# Patient Record
Sex: Male | Born: 1995
Health system: Southern US, Community
[De-identification: ages and names within clinical notes are randomized; demographics above are authoritative.]

## PROBLEM LIST (undated history)

## (undated) DIAGNOSIS — I10 Essential (primary) hypertension: Secondary | ICD-10-CM

## (undated) DIAGNOSIS — R1115 Cyclical vomiting syndrome unrelated to migraine: Secondary | ICD-10-CM

## (undated) DIAGNOSIS — K219 Gastro-esophageal reflux disease without esophagitis: Secondary | ICD-10-CM

## (undated) DIAGNOSIS — F988 Other specified behavioral and emotional disorders with onset usually occurring in childhood and adolescence: Secondary | ICD-10-CM

## (undated) HISTORY — DX: Cyclical vomiting syndrome unrelated to migraine: R11.15

## (undated) HISTORY — PX: OTHER SURGICAL HISTORY: SHX169

## (undated) HISTORY — DX: Essential (primary) hypertension: I10

## (undated) HISTORY — DX: Other specified behavioral and emotional disorders with onset usually occurring in childhood and adolescence: F98.8

## (undated) HISTORY — DX: Gastro-esophageal reflux disease without esophagitis: K21.9

---
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
## 2015-08-29 ENCOUNTER — Encounter: Payer: Self-pay | Admitting: Family Medicine

## 2015-08-29 ENCOUNTER — Ambulatory Visit (INDEPENDENT_AMBULATORY_CARE_PROVIDER_SITE_OTHER): Payer: Self-pay | Admitting: Family Medicine

## 2015-08-29 VITALS — BP 150/70 | Ht 75.0 in | Wt 232.2 lb

## 2015-08-29 DIAGNOSIS — I10 Essential (primary) hypertension: Secondary | ICD-10-CM

## 2015-08-29 DIAGNOSIS — Z13220 Encounter for screening for lipoid disorders: Secondary | ICD-10-CM

## 2015-08-29 DIAGNOSIS — Z79899 Other long term (current) drug therapy: Secondary | ICD-10-CM

## 2015-08-29 MED ORDER — ENALAPRIL MALEATE 10 MG PO TABS: 10.0000 mg | ORAL_TABLET | Freq: Every day | ORAL | Status: DC

## 2015-08-29 NOTE — Patient Instructions (Signed)
Hypertension Hypertension, commonly called high blood pressure, is when the force of blood pumping through your arteries is too strong. Your arteries are the blood vessels that carry blood from your heart throughout your body. A blood pressure reading consists of a higher number over a lower number, such as 110/72. The higher number (systolic) is the pressure inside your arteries when your heart pumps. The lower number (diastolic) is the pressure inside your arteries when your heart relaxes. Ideally you want your blood pressure below 120/80. Hypertension forces your heart to work harder to pump blood. Your arteries may become narrow or stiff. Having untreated or uncontrolled hypertension can cause heart attack, stroke, kidney disease, and other problems. RISK FACTORS Some risk factors for high blood pressure are controllable. Others are not.  Risk factors you cannot control include:   Race. You may be at higher risk if you are African American.  Age. Risk increases with age.  Gender. Men are at higher risk than women before age 45 years. After age 65, women are at higher risk than men. Risk factors you can control include:  Not getting enough exercise or physical activity.  Being overweight.  Getting too much fat, sugar, calories, or salt in your diet.  Drinking too much alcohol. SIGNS AND SYMPTOMS Hypertension does not usually cause signs or symptoms. Extremely high blood pressure (hypertensive crisis) may cause headache, anxiety, shortness of breath, and nosebleed. DIAGNOSIS To check if you have hypertension, your health care provider will measure your blood pressure while you are seated, with your arm held at the level of your heart. It should be measured at least twice using the same arm. Certain conditions can cause a difference in blood pressure between your right and left arms. A blood pressure reading that is higher than normal on one occasion does not mean that you need treatment. If  it is not clear whether you have high blood pressure, you may be asked to return on a different day to have your blood pressure checked again. Or, you may be asked to monitor your blood pressure at home for 1 or more weeks. TREATMENT Treating high blood pressure includes making lifestyle changes and possibly taking medicine. Living a healthy lifestyle can help lower high blood pressure. You may need to change some of your habits. Lifestyle changes may include:  Following the DASH diet. This diet is high in fruits, vegetables, and whole grains. It is low in salt, red meat, and added sugars.  Keep your sodium intake below 2,300 mg per day.  Getting at least 30-45 minutes of aerobic exercise at least 4 times per week.  Losing weight if necessary.  Not smoking.  Limiting alcoholic beverages.  Learning ways to reduce stress. Your health care provider may prescribe medicine if lifestyle changes are not enough to get your blood pressure under control, and if one of the following is true:  You are 18-59 years of age and your systolic blood pressure is above 140.  You are 60 years of age or older, and your systolic blood pressure is above 150.  Your diastolic blood pressure is above 90.  You have diabetes, and your systolic blood pressure is over 140 or your diastolic blood pressure is over 90.  You have kidney disease and your blood pressure is above 140/90.  You have heart disease and your blood pressure is above 140/90. Your personal target blood pressure may vary depending on your medical conditions, your age, and other factors. HOME CARE INSTRUCTIONS    Have your blood pressure rechecked as directed by your health care provider.   Take medicines only as directed by your health care provider. Follow the directions carefully. Blood pressure medicines must be taken as prescribed. The medicine does not work as well when you skip doses. Skipping doses also puts you at risk for  problems.  Do not smoke.   Monitor your blood pressure at home as directed by your health care provider. SEEK MEDICAL CARE IF:   You think you are having a reaction to medicines taken.  You have recurrent headaches or feel dizzy.  You have swelling in your ankles.  You have trouble with your vision. SEEK IMMEDIATE MEDICAL CARE IF:  You develop a severe headache or confusion.  You have unusual weakness, numbness, or feel faint.  You have severe chest or abdominal pain.  You vomit repeatedly.  You have trouble breathing. MAKE SURE YOU:   Understand these instructions.  Will watch your condition.  Will get help right away if you are not doing well or get worse.   This information is not intended to replace advice given to you by your health care provider. Make sure you discuss any questions you have with your health care provider.   Document Released: 10/14/2005 Document Revised: 02/28/2015 Document Reviewed: 08/06/2013 Elsevier Interactive Patient Education 2016 Elsevier Inc.  

## 2015-08-29 NOTE — Progress Notes (Signed)
   Subjective:    Patient ID: Tyler [name], male    DOB: 1996-09-14, 19 y.o.   MRN: [id number]  Hypertension This is a chronic problem. The current episode started more than 1 year ago. The problem is unchanged. The problem is uncontrolled. There are no associated agents to hypertension. There are no known risk factors for coronary artery disease. Past treatments include nothing. The current treatment provides no improvement. There are no compliance problems.    Patient states that he has no other concerns at this time.   167 over 91  Walking five mi per day at work  A lot of fast food admits to poor diet   There is strong family history of high blood pressure.  Review of the last several years reveals patient's blood pressure often substantial elevated when he arrives  Has a history of abdominal migraines is a 19 year old even at that time was told blood pressure was on the high side but that the beta blocker that he took seemed to help with this  Very strong family history of hypertension  Review of Systems No headache no chest pain no back pain no abdominal pain some weight gain no change in bowel habits complete ROS otherwise negative    Objective:   Physical Exam Alert vitals stable blood pressure 145/86 both arms on repeat HEENT normal. Lungs clear heart regular in rhythm       Assessment & Plan:  Impression hypertension time to treatment discuss is been elevated for years without improvement. Many questions answered regarding intervention medication side effects long-term risk benefits etc. plan initiate enalapril 10 mg daily at bedtime. Appropriate blood work. Diet exercise discussed. Recheck in 2 months. 25 minutes spent most in discussion [place]

## 2015-10-31 ENCOUNTER — Ambulatory Visit: Payer: Self-pay | Admitting: Family Medicine

## 2015-11-14 ENCOUNTER — Ambulatory Visit (INDEPENDENT_AMBULATORY_CARE_PROVIDER_SITE_OTHER): Payer: BLUE CROSS/BLUE SHIELD | Admitting: Family Medicine

## 2015-11-14 ENCOUNTER — Encounter: Payer: Self-pay | Admitting: Family Medicine

## 2015-11-14 VITALS — BP 132/74 | Ht 75.0 in | Wt 240.0 lb

## 2015-11-14 DIAGNOSIS — I10 Essential (primary) hypertension: Secondary | ICD-10-CM

## 2015-11-14 NOTE — Progress Notes (Signed)
   Subjective:    Patient ID: Tyler [name], male    DOB: [birth year], 20 y.o.   MRN: [id number]  Hypertension This is a chronic problem. Treatments tried: enalapril. Compliance problems include diet.    Pt states no concerns or problems. Did not do bloodwork last time bc he did not have insurance.   Once or twice missed dose  Watching salt intak ehs cut out  Salt down   Has notgot his blood work yet.  Watching salt intake. No obvious side effects from the medication.  Review of Systems No headache no chest pain no back pain no abdominal pain ROS otherwise negative    Objective:   Physical Exam  Alert no acute distress vital stable blood pressure repeat 128/82 HEENT normal lungs clear      Assessment & Plan:  Impression hypertension control much improved discussed plan press on blood work. Diet exercise discussed compliance discussed recheck in 6 months WSL

## 2015-12-26 ENCOUNTER — Encounter: Payer: Self-pay | Admitting: Family Medicine

## 2015-12-26 ENCOUNTER — Ambulatory Visit (INDEPENDENT_AMBULATORY_CARE_PROVIDER_SITE_OTHER): Payer: BLUE CROSS/BLUE SHIELD | Admitting: Family Medicine

## 2015-12-26 VITALS — BP 132/78 | Temp 99.8°F | Ht 75.0 in | Wt 236.4 lb

## 2015-12-26 DIAGNOSIS — J111 Influenza due to unidentified influenza virus with other respiratory manifestations: Secondary | ICD-10-CM | POA: Diagnosis not present

## 2015-12-26 MED ORDER — OSELTAMIVIR PHOSPHATE 75 MG PO CAPS: 75.0000 mg | ORAL_CAPSULE | Freq: Two times a day (BID) | ORAL | Status: DC

## 2015-12-26 NOTE — Progress Notes (Signed)
   Subjective:    Patient ID: Tyler [name], male    DOB: Mar 25, 1996, 20 y.o.   MRN: [id number]  Sore Throat  This is a new problem. The current episode started in the past 7 days. Associated symptoms include coughing, diarrhea and headaches. Treatments tried: Delsym, throat spray.   Feeling terrible yest4day  Head ache dizziness  Bad cough   Throat hurting  Energy level puny  ,  Deep cough, no fever hi, low gr Patient states no other concerns this visit.  Review of Systems  Respiratory: Positive for cough.   Gastrointestinal: Positive for diarrhea.  Neurological: Positive for headaches.       Objective:   Physical Exam   alert moderate malaise. Positive fever. Vitals stable. HEENT moderate nasal congestion. Neck supple lungs deep bronchial cough no wheezes no crackles heart rare rhythm      Assessment & Plan:   impression influenza discussed #2 hypertension improved on repeat plan Tamiflu twice a day 5 days. Symptom care discussed warning signs discussed WSL

## 2016-01-24 ENCOUNTER — Encounter: Payer: Self-pay | Admitting: Family Medicine

## 2016-06-06 DIAGNOSIS — H5213 Myopia, bilateral: Secondary | ICD-10-CM | POA: Diagnosis not present

## 2016-06-06 DIAGNOSIS — H52221 Regular astigmatism, right eye: Secondary | ICD-10-CM | POA: Diagnosis not present

## 2016-06-21 ENCOUNTER — Other Ambulatory Visit: Payer: Self-pay | Admitting: Family Medicine

## 2016-09-06 ENCOUNTER — Encounter: Payer: Self-pay | Admitting: Family Medicine

## 2016-09-06 ENCOUNTER — Ambulatory Visit (INDEPENDENT_AMBULATORY_CARE_PROVIDER_SITE_OTHER): Payer: BLUE CROSS/BLUE SHIELD | Admitting: Family Medicine

## 2016-09-06 VITALS — BP 134/82 | Ht 75.0 in | Wt 247.4 lb

## 2016-09-06 DIAGNOSIS — I10 Essential (primary) hypertension: Secondary | ICD-10-CM | POA: Diagnosis not present

## 2016-09-06 DIAGNOSIS — Z13220 Encounter for screening for lipoid disorders: Secondary | ICD-10-CM

## 2016-09-06 DIAGNOSIS — Z79899 Other long term (current) drug therapy: Secondary | ICD-10-CM | POA: Diagnosis not present

## 2016-09-06 MED ORDER — ENALAPRIL MALEATE 10 MG PO TABS: 10.0000 mg | ORAL_TABLET | Freq: Every day | ORAL | 5 refills | Status: DC

## 2016-09-06 NOTE — Progress Notes (Signed)
   Subjective:    Patient ID: Tyler [name], male    DOB: 01/28/1996, 20 y.o.   MRN: [id number]  Hypertension  This is a chronic problem. The current episode started more than 1 year ago.   Patient states no other concerns this visit.   Takes med well without a problem Blood pressure medicine and blood pressure levels reviewed today with patient. Compliant with blood pressure medicine. States does not miss a dose. No obvious side effects. Blood pressure generally good when checked elsewhere. Watching salt intake.    Review of Systems No headache, no major weight loss or weight gain, no chest pain no back pain abdominal pain no change in bowel habits complete ROS otherwise negative     Objective:   Physical Exam  Alert vitals stable, NAD. Blood pressure good on repeat. HEENT normal. Lungs clear. Heart regular rate and rhythm.       Assessment & Plan:  Impression hypertension great control discussed, to continue same medicine. Just ran out the last several days some numbers borderline high today plan continue same medications. Diet exercise discussed, appropriate blood work

## 2016-09-23 ENCOUNTER — Ambulatory Visit: Payer: BLUE CROSS/BLUE SHIELD | Admitting: Family Medicine

## 2017-07-25 ENCOUNTER — Telehealth: Payer: Self-pay | Admitting: Family Medicine

## 2017-07-25 NOTE — Telephone Encounter (Signed)
Patient has never completed blood work ordered in [facility] for 2017 or 2016 (lipid, liver and met 7)

## 2017-07-25 NOTE — Telephone Encounter (Signed)
Patient has an appointment on 08/07/17 with Dr. Richardson Landry to refill his blood pressure medications.  He said he will run out after the weekend and wants to know if we can refill this for him until his appointment?  Also, he wants to know if he needs labs done?   [place]

## 2017-07-27 NOTE — Telephone Encounter (Signed)
30 d worth lip liv m7

## 2017-07-28 ENCOUNTER — Other Ambulatory Visit: Payer: Self-pay | Admitting: *Deleted

## 2017-07-28 DIAGNOSIS — Z13220 Encounter for screening for lipoid disorders: Secondary | ICD-10-CM

## 2017-07-28 DIAGNOSIS — I10 Essential (primary) hypertension: Secondary | ICD-10-CM

## 2017-07-28 DIAGNOSIS — Z79899 Other long term (current) drug therapy: Secondary | ICD-10-CM

## 2017-07-28 MED ORDER — ENALAPRIL MALEATE 10 MG PO TABS: 10.0000 mg | ORAL_TABLET | Freq: Every day | ORAL | 0 refills | Status: DC

## 2017-07-28 NOTE — Telephone Encounter (Signed)
Discussed with pt. Pt verbalized understanding.  °

## 2017-07-28 NOTE — Telephone Encounter (Signed)
Left message to return call. Orders ready and 30 day supply of bp med sent to pharm.

## 2017-07-31 ENCOUNTER — Other Ambulatory Visit: Payer: Self-pay | Admitting: Family Medicine

## 2017-08-02 DIAGNOSIS — Z13220 Encounter for screening for lipoid disorders: Secondary | ICD-10-CM | POA: Diagnosis not present

## 2017-08-02 DIAGNOSIS — I10 Essential (primary) hypertension: Secondary | ICD-10-CM | POA: Diagnosis not present

## 2017-08-02 DIAGNOSIS — Z79899 Other long term (current) drug therapy: Secondary | ICD-10-CM | POA: Diagnosis not present

## 2017-08-03 LAB — BASIC METABOLIC PANEL
BUN/Creatinine Ratio: 13 (ref 9–20)
BUN: 14 mg/dL (ref 6–20)
CALCIUM: 9.5 mg/dL (ref 8.7–10.2)
CHLORIDE: 99 mmol/L (ref 96–106)
CO2: 25 mmol/L (ref 20–29)
CREATININE: 1.06 mg/dL (ref 0.76–1.27)
GFR calc non Af Amer: 100 mL/min/{1.73_m2} (ref >59)
GFR, EST AFRICAN AMERICAN: 115 mL/min/{1.73_m2} (ref >59)
Glucose: 87 mg/dL (ref 65–99)
Potassium: 4.5 mmol/L (ref 3.5–5.2)
Sodium: 141 mmol/L (ref 134–144)

## 2017-08-03 LAB — LIPID PANEL
CHOL/HDL RATIO: 6.1 ratio — AB (ref 0.0–5.0)
Cholesterol, Total: 207 mg/dL — ABNORMAL HIGH (ref 100–199)
HDL: 34 mg/dL — ABNORMAL LOW (ref >39)
LDL CALC: 131 mg/dL — AB (ref 0–99)
TRIGLYCERIDES: 212 mg/dL — AB (ref 0–149)
VLDL Cholesterol Cal: 42 mg/dL — ABNORMAL HIGH (ref 5–40)

## 2017-08-03 LAB — HEPATIC FUNCTION PANEL
ALT: 31 IU/L (ref 0–44)
AST: 10 IU/L (ref 0–40)
Albumin: 4.5 g/dL (ref 3.5–5.5)
Alkaline Phosphatase: 76 IU/L (ref 39–117)
BILIRUBIN TOTAL: 0.3 mg/dL (ref 0.0–1.2)
Bilirubin, Direct: 0.08 mg/dL (ref 0.00–0.40)
TOTAL PROTEIN: 7.1 g/dL (ref 6.0–8.5)

## 2017-08-07 ENCOUNTER — Ambulatory Visit (INDEPENDENT_AMBULATORY_CARE_PROVIDER_SITE_OTHER): Payer: BLUE CROSS/BLUE SHIELD | Admitting: Family Medicine

## 2017-08-07 ENCOUNTER — Encounter: Payer: Self-pay | Admitting: Family Medicine

## 2017-08-07 VITALS — BP 138/82 | Ht 75.0 in | Wt 243.2 lb

## 2017-08-07 DIAGNOSIS — I10 Essential (primary) hypertension: Secondary | ICD-10-CM | POA: Diagnosis not present

## 2017-08-07 MED ORDER — ENALAPRIL MALEATE 10 MG PO TABS: 10.0000 mg | ORAL_TABLET | Freq: Every day | ORAL | 11 refills | Status: DC

## 2017-08-07 NOTE — Progress Notes (Signed)
   Subjective:    Patient ID: Tyler [name], male    DOB: [birth year], 21 y.o.   MRN: [id number]  Hypertension  This is a chronic problem. The current episode started more than 1 year ago. Risk factors for coronary artery disease include male gender. Treatments tried: vasotec. There are no compliance problems.    Blood pressure medicine and blood pressure levels reviewed today with patient. Compliant with blood pressure medicine. States does not miss a dose. No obvious side effects. Blood pressure generally good when checked elsewhere. Watching salt intake.     Review of Systems No headache, no major weight loss or weight gain, no chest pain no back pain abdominal pain no change in bowel habits complete ROS otherwise negative     Objective:   Physical Exam Alert vitals stable, NAD. Blood pressure good on repeat. HEENT normal. Lungs clear. Heart regular rate and rhythm.        Assessment & Plan:  Impression hypertension discuss good control maintain same meds  #2 hyperlipidemia discuss educational information given.  With patient's relative use and good compliance with meds will give 1 years worth of blood pressure meds. Let patient know I generally see every 6 months. In this case can go one year. Patient to work on diet and exercise

## 2017-08-07 NOTE — Patient Instructions (Addendum)
Results for orders placed or performed in visit on 07/28/17  Lipid panel  Result Value Ref Range   Cholesterol, Total 207 (H) 100 - 199 mg/dL   Triglycerides 212 (H) 0 - 149 mg/dL   HDL 34 (L) >39 mg/dL   VLDL Cholesterol Cal 42 (H) 5 - 40 mg/dL   LDL Calculated 131 (H) 0 - 99 mg/dL   Chol/HDL Ratio 6.1 (H) 0.0 - 5.0 ratio  Hepatic function panel  Result Value Ref Range   Total Protein 7.1 6.0 - 8.5 g/dL   Albumin 4.5 3.5 - 5.5 g/dL   Bilirubin Total 0.3 0.0 - 1.2 mg/dL   Bilirubin, Direct 0.08 0.00 - 0.40 mg/dL   Alkaline Phosphatase 76 39 - 117 IU/L   AST 10 0 - 40 IU/L   ALT 31 0 - 44 IU/L  Basic metabolic panel  Result Value Ref Range   Glucose 87 65 - 99 mg/dL   BUN 14 6 - 20 mg/dL   Creatinine, Ser 1.06 0.76 - 1.27 mg/dL   GFR calc non Af Amer 100 >59 mL/min/1.73   GFR calc Af Amer 115 >59 mL/min/1.73   BUN/Creatinine Ratio 13 9 - 20   Sodium 141 134 - 144 mmol/L   Potassium 4.5 3.5 - 5.2 mmol/L   Chloride 99 96 - 106 mmol/L   CO2 25 20 - 29 mmol/L   Calcium 9.5 8.7 - 10.2 mg/dL    Cholesterol Cholesterol is a white, waxy, fat-like substance that is needed by the human body in small amounts. The liver makes all the cholesterol we need. Cholesterol is carried from the liver by the blood through the blood vessels. Deposits of cholesterol (plaques) may build up on blood vessel (artery) walls. Plaques make the arteries narrower and stiffer. Cholesterol plaques increase the risk for heart attack and stroke. You cannot feel your cholesterol level even if it is very high. The only way to know that it is high is to have a blood test. Once you know your cholesterol levels, you should keep a record of the test results. Work with your health care provider to keep your levels in the desired range. What do the results mean?  Total cholesterol is a rough measure of all the cholesterol in your blood.  LDL (low-density lipoprotein) is the "bad" cholesterol. This is the type that causes  plaque to build up on the artery walls. You want this level to be low.  HDL (high-density lipoprotein) is the "good" cholesterol because it cleans the arteries and carries the LDL away. You want this level to be high.  Triglycerides are fat that the body can either burn for energy or store. High levels are closely linked to heart disease. What are the desired levels of cholesterol?  Total cholesterol below 200.  LDL below 100 for people who are at risk, below 70 for people at very high risk.  HDL above 40 is good. A level of 60 or higher is considered to be protective against heart disease.  Triglycerides below 150. How can I lower my cholesterol? Diet Follow your diet program as told by your health care provider.  Choose fish or white meat chicken and [place], roasted or baked. Limit fatty cuts of red meat, fried foods, and processed meats, such as sausage and lunch meats.  Eat lots of fresh fruits and vegetables.  Choose whole grains, beans, pasta, potatoes, and cereals.  Choose olive oil, corn oil, or canola oil, and use only small amounts.  Avoid butter, mayonnaise, shortening, or palm kernel oils.  Avoid foods with trans fats.  Drink skim or nonfat milk and eat low-fat or nonfat yogurt and cheeses. Avoid whole milk, cream, ice cream, egg yolks, and full-fat cheeses.  Healthier desserts include angel food cake, ginger snaps, animal crackers, hard candy, popsicles, and low-fat or nonfat frozen yogurt. Avoid pastries, cakes, pies, and cookies.  Exercise  Follow your exercise program as told by your health care provider. A regular program: ? Helps to decrease LDL and raise HDL. ? Helps with weight control.  Do things that increase your activity level, such as gardening, walking, and taking the stairs.  Ask your health care provider about ways that you can be more active in your daily life.  Medicine  Take over-the-counter and prescription medicines only as told by your  health care provider. ? Medicine may be prescribed by your health care provider to help lower cholesterol and decrease the risk for heart disease. This is usually done if diet and exercise have failed to bring down cholesterol levels. ? If you have several risk factors, you may need medicine even if your levels are normal.  This information is not intended to replace advice given to you by your health care provider. Make sure you discuss any questions you have with your health care provider. Document Released: 07/09/2001 Document Revised: 05/11/2016 Document Reviewed: 04/13/2016 Elsevier Interactive Patient Education  2017 [employer].

## 2018-02-17 ENCOUNTER — Encounter: Payer: Self-pay | Admitting: Family Medicine

## 2018-02-17 ENCOUNTER — Ambulatory Visit (INDEPENDENT_AMBULATORY_CARE_PROVIDER_SITE_OTHER): Payer: BLUE CROSS/BLUE SHIELD | Admitting: Family Medicine

## 2018-02-17 VITALS — BP 168/88 | Ht 75.0 in | Wt 254.6 lb

## 2018-02-17 DIAGNOSIS — Z0000 Encounter for general adult medical examination without abnormal findings: Secondary | ICD-10-CM

## 2018-02-17 DIAGNOSIS — I10 Essential (primary) hypertension: Secondary | ICD-10-CM | POA: Diagnosis not present

## 2018-02-17 MED ORDER — AMLODIPINE BESYLATE 5 MG PO TABS: 5.0000 mg | ORAL_TABLET | Freq: Every day | ORAL | 5 refills | Status: DC

## 2018-02-17 NOTE — Patient Instructions (Signed)
DASH Eating Plan DASH stands for "Dietary Approaches to Stop Hypertension." The DASH eating plan is a healthy eating plan that has been shown to reduce high blood pressure (hypertension). It may also reduce your risk for type 2 diabetes, heart disease, and stroke. The DASH eating plan may also help with weight loss. What are tips for following this plan? General guidelines  Avoid eating more than 2,300 mg (milligrams) of salt (sodium) a day. If you have hypertension, you may need to reduce your sodium intake to 1,500 mg a day.  Limit alcohol intake to no more than 1 drink a day for nonpregnant women and 2 drinks a day for men. One drink equals 12 oz of beer, 5 oz of wine, or 1 oz of hard liquor.  Work with your health care provider to maintain a healthy body weight or to lose weight. Ask what an ideal weight is for you.  Get at least 30 minutes of exercise that causes your heart to beat faster (aerobic exercise) most days of the week. Activities may include walking, swimming, or biking.  Work with your health care provider or diet and nutrition specialist (dietitian) to adjust your eating plan to your individual calorie needs. Reading food labels  Check food labels for the amount of sodium per serving. Choose foods with less than 5 percent of the Daily Value of sodium. Generally, foods with less than 300 mg of sodium per serving fit into this eating plan.  To find whole grains, look for the word "whole" as the first word in the ingredient list. Shopping  Buy products labeled as "low-sodium" or "no salt added."  Buy fresh foods. Avoid canned foods and premade or frozen meals. Cooking  Avoid adding salt when cooking. Use salt-free seasonings or herbs instead of table salt or sea salt. Check with your health care provider or pharmacist before using salt substitutes.  Do not fry foods. Cook foods using healthy methods such as baking, boiling, grilling, and broiling instead.  Cook with  heart-healthy oils, such as olive, canola, soybean, or sunflower oil. Meal planning   Eat a balanced diet that includes: ? 5 or more servings of fruits and vegetables each day. At each meal, try to fill half of your plate with fruits and vegetables. ? Up to 6-8 servings of whole grains each day. ? Less than 6 oz of lean meat, poultry, or fish each day. A 3-oz serving of meat is about the same size as a deck of cards. One egg equals 1 oz. ? 2 servings of low-fat dairy each day. ? A serving of nuts, seeds, or beans 5 times each week. ? Heart-healthy fats. Healthy fats called Omega-3 fatty acids are found in foods such as flaxseeds and coldwater fish, like sardines, salmon, and mackerel.  Limit how much you eat of the following: ? Canned or prepackaged foods. ? Food that is high in trans fat, such as fried foods. ? Food that is high in saturated fat, such as fatty meat. ? Sweets, desserts, sugary drinks, and other foods with added sugar. ? Full-fat dairy products.  Do not salt foods before eating.  Try to eat at least 2 vegetarian meals each week.  Eat more home-cooked food and less restaurant, buffet, and fast food.  When eating at a restaurant, ask that your food be prepared with less salt or no salt, if possible. What foods are recommended? The items listed may not be a complete list. Talk with your dietitian about what   dietary choices are best for you. Grains Whole-grain or whole-wheat bread. Whole-grain or whole-wheat pasta. Brown rice. Oatmeal. Quinoa. Bulgur. Whole-grain and low-sodium cereals. Pita bread. Low-fat, low-sodium crackers. Whole-wheat flour tortillas. Vegetables Fresh or frozen vegetables (raw, steamed, roasted, or grilled). Low-sodium or reduced-sodium tomato and vegetable juice. Low-sodium or reduced-sodium tomato sauce and tomato paste. Low-sodium or reduced-sodium canned vegetables. Fruits All fresh, dried, or frozen fruit. Canned fruit in natural juice (without  added sugar). Meat and other protein foods Skinless chicken or turkey. Ground chicken or turkey. Pork with fat trimmed off. Fish and seafood. Egg whites. Dried beans, peas, or lentils. Unsalted nuts, nut butters, and seeds. Unsalted canned beans. Lean cuts of beef with fat trimmed off. Low-sodium, lean deli meat. Dairy Low-fat (1%) or fat-free (skim) milk. Fat-free, low-fat, or reduced-fat cheeses. Nonfat, low-sodium ricotta or cottage cheese. Low-fat or nonfat yogurt. Low-fat, low-sodium cheese. Fats and oils Soft margarine without trans fats. Vegetable oil. Low-fat, reduced-fat, or light mayonnaise and salad dressings (reduced-sodium). Canola, safflower, olive, soybean, and sunflower oils. Avocado. Seasoning and other foods Herbs. Spices. Seasoning mixes without salt. Unsalted popcorn and pretzels. Fat-free sweets. What foods are not recommended? The items listed may not be a complete list. Talk with your dietitian about what dietary choices are best for you. Grains Baked goods made with fat, such as croissants, muffins, or some breads. Dry pasta or rice meal packs. Vegetables Creamed or fried vegetables. Vegetables in a cheese sauce. Regular canned vegetables (not low-sodium or reduced-sodium). Regular canned tomato sauce and paste (not low-sodium or reduced-sodium). Regular tomato and vegetable juice (not low-sodium or reduced-sodium). Pickles. Olives. Fruits Canned fruit in a light or heavy syrup. Fried fruit. Fruit in cream or butter sauce. Meat and other protein foods Fatty cuts of meat. Ribs. Fried meat. Bacon. Sausage. Bologna and other processed lunch meats. Salami. Fatback. Hotdogs. Bratwurst. Salted nuts and seeds. Canned beans with added salt. Canned or smoked fish. Whole eggs or egg yolks. Chicken or turkey with skin. Dairy Whole or 2% milk, cream, and half-and-half. Whole or full-fat cream cheese. Whole-fat or sweetened yogurt. Full-fat cheese. Nondairy creamers. Whipped toppings.  Processed cheese and cheese spreads. Fats and oils Butter. Stick margarine. Lard. Shortening. Ghee. Bacon fat. Tropical oils, such as coconut, palm kernel, or palm oil. Seasoning and other foods Salted popcorn and pretzels. Onion salt, garlic salt, seasoned salt, table salt, and sea salt. Worcestershire sauce. Tartar sauce. Barbecue sauce. Teriyaki sauce. Soy sauce, including reduced-sodium. Steak sauce. Canned and packaged gravies. Fish sauce. Oyster sauce. Cocktail sauce. Horseradish that you find on the shelf. Ketchup. Mustard. Meat flavorings and tenderizers. Bouillon cubes. Hot sauce and Tabasco sauce. Premade or packaged marinades. Premade or packaged taco seasonings. Relishes. Regular salad dressings. Where to find more information:  National Heart, Lung, and Blood Institute: www.nhlbi.nih.gov  American Heart Association: www.heart.org Summary  The DASH eating plan is a healthy eating plan that has been shown to reduce high blood pressure (hypertension). It may also reduce your risk for type 2 diabetes, heart disease, and stroke.  With the DASH eating plan, you should limit salt (sodium) intake to 2,300 mg a day. If you have hypertension, you may need to reduce your sodium intake to 1,500 mg a day.  When on the DASH eating plan, aim to eat more fresh fruits and vegetables, whole grains, lean proteins, low-fat dairy, and heart-healthy fats.  Work with your health care provider or diet and nutrition specialist (dietitian) to adjust your eating plan to your individual   calorie needs. This information is not intended to replace advice given to you by your health care provider. Make sure you discuss any questions you have with your health care provider. Document Released: 10/03/2011 Document Revised: 10/07/2016 Document Reviewed: 10/07/2016 Elsevier Interactive Patient Education  2018 [employer]. Cholesterol Cholesterol is a white, waxy, fat-like substance that is needed by the human body  in small amounts. The liver makes all the cholesterol we need. Cholesterol is carried from the liver by the blood through the blood vessels. Deposits of cholesterol (plaques) may build up on blood vessel (artery) walls. Plaques make the arteries narrower and stiffer. Cholesterol plaques increase the risk for heart attack and stroke. You cannot feel your cholesterol level even if it is very high. The only way to know that it is high is to have a blood test. Once you know your cholesterol levels, you should keep a record of the test results. Work with your health care provider to keep your levels in the desired range. What do the results mean?  Total cholesterol is a rough measure of all the cholesterol in your blood.  LDL (low-density lipoprotein) is the "bad" cholesterol. This is the type that causes plaque to build up on the artery walls. You want this level to be low.  HDL (high-density lipoprotein) is the "good" cholesterol because it cleans the arteries and carries the LDL away. You want this level to be high.  Triglycerides are fat that the body can either burn for energy or store. High levels are closely linked to heart disease. What are the desired levels of cholesterol?  Total cholesterol below 200.  LDL below 100 for people who are at risk, below 70 for people at very high risk.  HDL above 40 is good. A level of 60 or higher is considered to be protective against heart disease.  Triglycerides below 150. How can I lower my cholesterol? Diet Follow your diet program as told by your health care provider.  Choose fish or white meat chicken and [place], roasted or baked. Limit fatty cuts of red meat, fried foods, and processed meats, such as sausage and lunch meats.  Eat lots of fresh fruits and vegetables.  Choose whole grains, beans, pasta, potatoes, and cereals.  Choose olive oil, corn oil, or canola oil, and use only small amounts.  Avoid butter, mayonnaise, shortening, or palm  kernel oils.  Avoid foods with trans fats.  Drink skim or nonfat milk and eat low-fat or nonfat yogurt and cheeses. Avoid whole milk, cream, ice cream, egg yolks, and full-fat cheeses.  Healthier desserts include angel food cake, ginger snaps, animal crackers, hard candy, popsicles, and low-fat or nonfat frozen yogurt. Avoid pastries, cakes, pies, and cookies.  Exercise  Follow your exercise program as told by your health care provider. A regular program: ? Helps to decrease LDL and raise HDL. ? Helps with weight control.  Do things that increase your activity level, such as gardening, walking, and taking the stairs.  Ask your health care provider about ways that you can be more active in your daily life.  Medicine  Take over-the-counter and prescription medicines only as told by your health care provider. ? Medicine may be prescribed by your health care provider to help lower cholesterol and decrease the risk for heart disease. This is usually done if diet and exercise have failed to bring down cholesterol levels. ? If you have several risk factors, you may need medicine even if your levels are normal.  This information is not intended to replace advice given to you by your health care provider. Make sure you discuss any questions you have with your health care provider. Document Released: 07/09/2001 Document Revised: 05/11/2016 Document Reviewed: 04/13/2016 Elsevier Interactive Patient Education  [employer].

## 2018-02-17 NOTE — Progress Notes (Signed)
   Subjective:    Patient ID: Tyler [name], male    DOB: 1996-09-28, 22 y.o.   MRN: [id number]  HPI  The patient comes in today for a wellness visit.    A review of their health history was completed.  A review of medications was also completed.  Any needed refills; none  Eating habits: normally trying to eat healthy  Falls/  MVA accidents in past few months: none  Regular exercise: active at work and weight lifting  Specialist pt sees on regular basis: no  Preventative health issues were discussed.   Additional concerns: uvula swells up a couple times a month /pt will start snoring, occurs once or twice per month, quit swollen, notes incr  No swellin lips or tongue,  Works at [employer] works on o e facilities of the [employer]   Several flights per day, working towards getting on gym  Both parents has htn   No major problesm    Blood pressure medicine and blood pressure levels reviewed today with patient. Compliant with blood pressure medicine. States does not miss a dose. No obvious side effects. Blood pressure generally good when checked elsewhere. Watching salt intake.       Review of Systems No headache, no major weight loss or weight gain, no chest pain no back pain abdominal pain no change in bowel habits complete ROS otherwise negative     Objective:   Physical Exam  Alert and oriented, vitals reviewed and stable, NAD ENT-TM's and ext canals WNL bilat via otoscopic exam Soft palate, tonsils and post pharynx WNL via oropharyngeal exam Neck-symmetric, no masses; thyroid nonpalpable and nontender Pulmonary-no tachypnea or accessory muscle use; Clear without wheezes via auscultation Card--no abnrml murmurs, rhythm reg and rate WNL Carotid pulses symmetric, without bruits       Assessment & Plan:  Impression wellness exam.  Diet discussed.  Exercise discussed.  Prior blood work reviewed.  2.  Hypertension.  Probable  side effect from medicine.  Intermittent angioedema of throat likely associated with ACE inhibitor.  Uvula swells up markedly.  Has been on ACE inhibitor for years.  This is stopped today.  Norvasc 5 mg initiated rationale discussed.  Diet exercise discussed follow-up in 6 months

## 2018-06-30 ENCOUNTER — Ambulatory Visit: Payer: BLUE CROSS/BLUE SHIELD | Admitting: Family Medicine

## 2018-06-30 ENCOUNTER — Encounter: Payer: Self-pay | Admitting: Family Medicine

## 2018-06-30 VITALS — BP 160/90 | Ht 75.0 in | Wt 257.0 lb

## 2018-06-30 DIAGNOSIS — R29818 Other symptoms and signs involving the nervous system: Secondary | ICD-10-CM | POA: Diagnosis not present

## 2018-06-30 DIAGNOSIS — I10 Essential (primary) hypertension: Secondary | ICD-10-CM | POA: Diagnosis not present

## 2018-06-30 MED ORDER — AMLODIPINE BESYLATE 10 MG PO TABS: 10.0000 mg | ORAL_TABLET | Freq: Every day | ORAL | 1 refills | Status: DC

## 2018-06-30 NOTE — Progress Notes (Addendum)
   Subjective:    Patient ID: Tyler [name], male    DOB: 01-05-1996, 22 y.o.   MRN: [id number]  HPI  Patient is here today to discuss his sleep disturbance. Per pt his fiance told him to come in and check for sleep apnea. He states he is waking up short of breath.   Sig trouble with noring   And daytime fatigue   And notes trouble with snoring  With sig  Issues of  Fatigue during the day  And nw every ay drowsiness  amdn sleepy   Feels tired most days per wk  During day tired   Sleeping while driving an issue  Once per month or so    Blood pressure medicine and blood pressure levels reviewed today with patient. Compliant with blood pressure medicine. States does not miss a dose. No obvious side effects.  Blood pressure more so lately watching salt intake.  Fa has sleep apnea  Has addd thrity pounds   Patient arrives office with substantial concerns regarding potential sleep apnea.  Positive family history.  Positive hypertension.  Substantial score on [employer].  Daytime fatigue.  Early morning drowsiness.  Substantial snoring.  Apnea spells witnessed by spouse.  Also history of high blood pressure with incidentally difficulty managing numbers of late Review of Systems  No headache, no major weight loss or weight gain, no chest pain no back pain abdominal pain no change in bowel habits complete ROS otherwise negative     Objective:   Physical Exam  Alert vitals stable, NAD. Blood pressure good on repeat. HEENT normal. Lungs clear. Heart regular rate and rhythm.       Assessment & Plan:  Impression potential sleep apnea.  Concerning with patient's relative youth and strong family history challenges including high blood pressure and sleep apnea.  Claims compliance with his blood pressure medication.  Number suboptimum.  We will go ahead and increase Norvasc to 10 mg.  Will initiate sleep study request.  Likely will have to jump through few hips getting  there potentially home monitoring etc.  Further management based on results  Greater than 50% of this 25 minute face to face visit was spent in counseling and discussion and coordination of care regarding the above diagnosis/diagnosies

## 2018-07-08 ENCOUNTER — Telehealth: Payer: Self-pay | Admitting: Family Medicine

## 2018-07-08 NOTE — Telephone Encounter (Signed)
Pt's OV note for 06/30/18 somehow got marked sensitive and is not able to be viewed   Need to see note for precert of sleep study (& possiibly print for CPAP order if it goes that far)  I have called IT to see if they can undo the sensitivity - just wanted to give you a heads up because they may need to talk to you to get this fixed

## 2018-07-08 NOTE — Telephone Encounter (Signed)
Per Daleen Snook with IT - Dr. Richardson Landry must do an addendum to the 06/30/18 OV note and when he's signing the addendum he needs to [facility] the sensitivity button

## 2018-07-08 NOTE — Telephone Encounter (Signed)
Ok done

## 2018-07-10 ENCOUNTER — Ambulatory Visit: Payer: BLUE CROSS/BLUE SHIELD | Admitting: Family Medicine

## 2018-07-13 ENCOUNTER — Telehealth: Payer: Self-pay | Admitting: Family Medicine

## 2018-07-13 NOTE — Telephone Encounter (Signed)
Please sign & date order for sleep study so I may send for scheduling  In green folder in yellow box

## 2018-07-15 NOTE — Telephone Encounter (Signed)
Sent signed order via email (fax failed)  They'll contact pt to schedule

## 2018-07-28 ENCOUNTER — Telehealth: Payer: Self-pay | Admitting: Family Medicine

## 2018-07-28 NOTE — Telephone Encounter (Signed)
Referral was placed for this patient, and order needs to be placed for a split night sleep study.  order number: [place] contact listed for any further questions.

## 2018-07-29 NOTE — Telephone Encounter (Signed)
Sent staff message to [facility], ? Nor sure what's needed  Order that was sent via email was for a split night study  Await advisement

## 2018-07-30 NOTE — Telephone Encounter (Signed)
No response to staff message, sent email, await advisement

## 2018-08-07 NOTE — Telephone Encounter (Signed)
Pt's scheduled for 08/13/18 - seen in chart review

## 2019-01-12 ENCOUNTER — Telehealth: Payer: Self-pay | Admitting: Family Medicine

## 2019-01-12 ENCOUNTER — Other Ambulatory Visit: Payer: Self-pay

## 2019-01-12 MED ORDER — AMLODIPINE BESYLATE 10 MG PO TABS: 10.0000 mg | ORAL_TABLET | Freq: Every day | ORAL | 1 refills | Status: DC

## 2019-01-12 NOTE — Telephone Encounter (Signed)
REQUESTING REFILL FOR amLODipine (NORVASC) 10 MG tablet   Pharmacy:  [facility] [place], [address] [place]

## 2019-01-12 NOTE — Telephone Encounter (Signed)
Medication sent in and pt is aware  

## 2019-07-23 ENCOUNTER — Telehealth: Payer: Self-pay | Admitting: Family Medicine

## 2019-07-23 MED ORDER — AMLODIPINE BESYLATE 10 MG PO TABS: 10.0000 mg | ORAL_TABLET | Freq: Every day | ORAL | 0 refills | Status: DC

## 2019-07-23 NOTE — Telephone Encounter (Signed)
May have 30-day Keep follow-up visit

## 2019-07-23 NOTE — Telephone Encounter (Signed)
Pt is out of his amLODipine (NORVASC) 10 MG tablet Needs refill  Has virtual visit scheduled for 08/03/2019  Please advise & call pt     [facility]

## 2019-07-23 NOTE — Telephone Encounter (Signed)
Refill sent in and pt is aware 

## 2019-08-03 ENCOUNTER — Ambulatory Visit: Payer: BC Managed Care – PPO | Admitting: Family Medicine

## 2019-08-03 ENCOUNTER — Other Ambulatory Visit: Payer: Self-pay

## 2019-08-23 ENCOUNTER — Telehealth: Payer: Self-pay | Admitting: Family Medicine

## 2019-08-23 MED ORDER — AMLODIPINE BESYLATE 10 MG PO TABS: 10.0000 mg | ORAL_TABLET | Freq: Every day | ORAL | 0 refills | Status: DC

## 2019-08-23 NOTE — Telephone Encounter (Signed)
Prescription sent electronically to pharmacy. Patient notified. 

## 2019-08-23 NOTE — Telephone Encounter (Signed)
Ok one mo

## 2019-08-23 NOTE — Telephone Encounter (Signed)
Patient is requesting refill on blood pressure medication he missed his last virtual visit on 10/6 and reschedule for 11/2 but not enough medication for carry him to this appointment. [facility]

## 2019-08-30 ENCOUNTER — Ambulatory Visit (INDEPENDENT_AMBULATORY_CARE_PROVIDER_SITE_OTHER): Payer: BC Managed Care – PPO | Admitting: Family Medicine

## 2019-08-30 ENCOUNTER — Encounter: Payer: Self-pay | Admitting: Family Medicine

## 2019-08-30 ENCOUNTER — Other Ambulatory Visit: Payer: Self-pay

## 2019-08-30 DIAGNOSIS — I10 Essential (primary) hypertension: Secondary | ICD-10-CM | POA: Diagnosis not present

## 2019-08-30 MED ORDER — OMEPRAZOLE MAGNESIUM 20 MG PO TBEC: 20.0000 mg | DELAYED_RELEASE_TABLET | Freq: Every day | ORAL | 1 refills | Status: AC

## 2019-08-30 MED ORDER — AMLODIPINE BESYLATE 10 MG PO TABS: 10.0000 mg | ORAL_TABLET | Freq: Every day | ORAL | 1 refills | Status: DC

## 2019-08-30 NOTE — Progress Notes (Signed)
   Subjective:  Audio only  Patient ID: Tyler [name], male    DOB: 03/04/1996, 23 y.o.   MRN: [id number]  Pt checked bp about 2 days ago and it was 140/84, 136/85.   Hypertension This is a chronic problem. Treatments tried: amlodipine 10mg . Compliance problems include exercise.    Virtual Visit via Telephone Note  I connected with Tyler [name] on 08/30/19 at  8:40 AM EST by telephone and verified that I am speaking with the correct person using two identifiers.  Location: Patient: home Provider: office   I discussed the limitations, risks, security and privacy concerns of performing an evaluation and management service by telephone and the availability of in person appointments. I also discussed with the patient that there may be a patient responsible charge related to this service. The patient expressed understanding and agreed to proceed.   History of Present Illness:    Observations/Objective:   Assessment and Plan:   Follow Up Instructions:    I discussed the assessment and treatment plan with the patient. The patient was provided an opportunity to ask questions and all were answered. The patient agreed with the plan and demonstrated an understanding of the instructions.   The patient was advised to call back or seek an in-person evaluation if the symptoms worsen or if the condition fails to improve as anticipated.  I provided 19 minutes of non-face-to-face time during this encounter.    Blood pressure medicine and blood pressure levels reviewed today with patient. Compliant with blood pressure medicine. States does not miss a dose. No obvious side effects. Blood pressure generally good when checked elsewhere. Watching salt intake.  Patient reports having gained some weight.  Diet the past.  He realizes also not exercising much working hard but not exercising   Review of Systems  No headache, no major weight loss or weight gain, no chest pain no back pain  abdominal pain no change in bowel habits complete ROS otherwise negative     Objective:   Physical Exam Virtual       Assessment & Plan:  Impression hypertension.  Good control.  Compliance with medication discussed.  Diet and exercise discussed.  Follow-up in 6 months for wellness plus chronic

## 2019-08-30 NOTE — Addendum Note (Signed)
Addended by: Carmelina Noun on: 08/30/2019 09:55 AM   Modules accepted: Orders

## 2019-10-16 DIAGNOSIS — H5213 Myopia, bilateral: Secondary | ICD-10-CM | POA: Diagnosis not present

## 2019-10-28 ENCOUNTER — Ambulatory Visit: Payer: Self-pay | Attending: Internal Medicine

## 2019-10-28 ENCOUNTER — Other Ambulatory Visit: Payer: Self-pay

## 2019-10-28 DIAGNOSIS — Z20828 Contact with and (suspected) exposure to other viral communicable diseases: Secondary | ICD-10-CM | POA: Insufficient documentation

## 2019-10-28 DIAGNOSIS — Z20822 Contact with and (suspected) exposure to covid-19: Secondary | ICD-10-CM

## 2019-10-29 LAB — NOVEL CORONAVIRUS, NAA: SARS-CoV-2, NAA: NOT DETECTED

## 2019-11-01 ENCOUNTER — Telehealth: Payer: Self-pay

## 2019-11-01 NOTE — Telephone Encounter (Signed)
Caller given negative result and verbalized understanding  

## 2019-11-24 ENCOUNTER — Encounter: Payer: Self-pay | Admitting: Family Medicine

## 2019-11-25 ENCOUNTER — Encounter: Payer: Self-pay | Admitting: Family Medicine

## 2019-11-25 DIAGNOSIS — Z20822 Contact with and (suspected) exposure to covid-19: Secondary | ICD-10-CM | POA: Diagnosis not present

## 2019-11-25 DIAGNOSIS — Z7189 Other specified counseling: Secondary | ICD-10-CM | POA: Diagnosis not present

## 2019-12-23 ENCOUNTER — Encounter (HOSPITAL_COMMUNITY): Payer: Self-pay

## 2019-12-23 ENCOUNTER — Ambulatory Visit (HOSPITAL_COMMUNITY)
Admission: RE | Admit: 2019-12-23 | Discharge: 2019-12-23 | Disposition: A | Discharge status: Home / Self Care | Payer: BC Managed Care – PPO | Source: Ambulatory Visit | Attending: Family Medicine | Admitting: Family Medicine

## 2019-12-23 ENCOUNTER — Ambulatory Visit (INDEPENDENT_AMBULATORY_CARE_PROVIDER_SITE_OTHER): Payer: BC Managed Care – PPO | Admitting: Family Medicine

## 2019-12-23 ENCOUNTER — Encounter: Payer: Self-pay | Admitting: Family Medicine

## 2019-12-23 ENCOUNTER — Other Ambulatory Visit: Payer: Self-pay

## 2019-12-23 VITALS — BP 138/76 | Temp 97.6°F | Ht 75.0 in | Wt 261.0 lb

## 2019-12-23 DIAGNOSIS — Z13220 Encounter for screening for lipoid disorders: Secondary | ICD-10-CM | POA: Diagnosis not present

## 2019-12-23 DIAGNOSIS — R05 Cough: Secondary | ICD-10-CM

## 2019-12-23 DIAGNOSIS — R590 Localized enlarged lymph nodes: Secondary | ICD-10-CM

## 2019-12-23 DIAGNOSIS — R599 Enlarged lymph nodes, unspecified: Secondary | ICD-10-CM

## 2019-12-23 DIAGNOSIS — Z79899 Other long term (current) drug therapy: Secondary | ICD-10-CM

## 2019-12-23 DIAGNOSIS — I10 Essential (primary) hypertension: Secondary | ICD-10-CM | POA: Diagnosis not present

## 2019-12-23 DIAGNOSIS — R9389 Abnormal findings on diagnostic imaging of other specified body structures: Secondary | ICD-10-CM

## 2019-12-23 DIAGNOSIS — R059 Cough, unspecified: Secondary | ICD-10-CM

## 2019-12-23 MED ORDER — AZITHROMYCIN 250 MG PO TABS: ORAL_TABLET | ORAL | 0 refills | Status: DC

## 2019-12-23 NOTE — Progress Notes (Signed)
   Subjective:    Patient ID: Tyler [name], male    DOB: Sep 14, 1996, 24 y.o.   MRN: [id number]  HPInoticed a knot on left side of neck yesterday. Painful to touch.    Patient presents with a knot above his left clavicle.  Just discovered yesterday.  Feels somewhat sensitive.  No trouble swallowing.  Patient also notes cough.  Has improved through the winter.  Mild nagging just 1 go away.  Generally nonproductive.  No fever or chills  No headache no chest pain no shortness of breath no weight loss  Compliant with medications for blood pressure.  Tries not to miss a dose.   Review of Systems See above    Objective:   Physical Exam  Alert active good hydration blood pressure improved on repeat.  HEENT thyroid normal neck supple positive supraclavicular lump x2 noted left supraclavicular space./Clearly abnormal palpable Axillary region no palpable abnormality.  Abdominal exam benign     Assessment & Plan:  Impression #1 supraclavicular mass.  Along with chronic cough.  Discussion held with patient.  Substantial.  Numerous questions answered.  Concerning.  Work-up certainly deserved  Addendum after this substantial visit I spent another 20 minutes with the patient after all results came back.  Chest x-ray showed a left mediastinal mass.  This along with the supraclavicular palpable abnormality is concerning.  Potential etiologies discussed with the patient both malignant and nonmalignant.  Is a young white male I would have to say lymphoma is highest on the list.  This was discussed frankly with the patient.  Proper steps and pending this down as discussed.  We will pursue CT scan of the chest as requested.  Will consider going ahead with a CT of the neck with the neck abnormality and getting a scan anyway.  We will need to assess renal function.  We will need to assess blood work.  We will need to do oncology referral.  They will be the ones working towards a tissue diagnosis likely with  ENT referral discussed with patient.  Numerous questions answered  Greater than 50% of this 40 minute face to face visit was spent in counseling and discussion and coordination of care regarding the above diagnosis/diagnosies

## 2019-12-24 ENCOUNTER — Other Ambulatory Visit: Payer: Self-pay | Admitting: *Deleted

## 2019-12-24 ENCOUNTER — Encounter: Payer: Self-pay | Admitting: Family Medicine

## 2019-12-24 DIAGNOSIS — Z13220 Encounter for screening for lipoid disorders: Secondary | ICD-10-CM | POA: Diagnosis not present

## 2019-12-24 DIAGNOSIS — R590 Localized enlarged lymph nodes: Secondary | ICD-10-CM

## 2019-12-24 DIAGNOSIS — R222 Localized swelling, mass and lump, trunk: Secondary | ICD-10-CM

## 2019-12-24 DIAGNOSIS — I10 Essential (primary) hypertension: Secondary | ICD-10-CM | POA: Diagnosis not present

## 2019-12-24 DIAGNOSIS — Z79899 Other long term (current) drug therapy: Secondary | ICD-10-CM | POA: Diagnosis not present

## 2019-12-25 ENCOUNTER — Encounter: Payer: Self-pay | Admitting: Family Medicine

## 2019-12-25 LAB — BASIC METABOLIC PANEL
BUN/Creatinine Ratio: 13 (ref 9–20)
BUN: 13 mg/dL (ref 6–20)
CO2: 25 mmol/L (ref 20–29)
Calcium: 9.3 mg/dL (ref 8.7–10.2)
Chloride: 100 mmol/L (ref 96–106)
Creatinine, Ser: 0.98 mg/dL (ref 0.76–1.27)
GFR calc Af Amer: 125 mL/min/{1.73_m2} (ref >59)
GFR calc non Af Amer: 108 mL/min/{1.73_m2} (ref >59)
Glucose: 97 mg/dL (ref 65–99)
Potassium: 4.7 mmol/L (ref 3.5–5.2)
Sodium: 140 mmol/L (ref 134–144)

## 2019-12-25 LAB — CBC WITH DIFFERENTIAL/PLATELET
Basophils Absolute: 0.1 x10E3/uL (ref 0.0–0.2)
Basos: 0 %
EOS (ABSOLUTE): 0.1 x10E3/uL (ref 0.0–0.4)
Eos: 1 %
Hematocrit: 38.8 % (ref 37.5–51.0)
Hemoglobin: 12.9 g/dL — ABNORMAL LOW (ref 13.0–17.7)
Immature Grans (Abs): 0.1 x10E3/uL (ref 0.0–0.1)
Immature Granulocytes: 1 %
Lymphocytes Absolute: 2 x10E3/uL (ref 0.7–3.1)
Lymphs: 11 %
MCH: 28 pg (ref 26.6–33.0)
MCHC: 33.2 g/dL (ref 31.5–35.7)
MCV: 84 fL (ref 79–97)
Monocytes Absolute: 1.4 x10E3/uL — ABNORMAL HIGH (ref 0.1–0.9)
Monocytes: 8 %
Neutrophils Absolute: 14 x10E3/uL — ABNORMAL HIGH (ref 1.4–7.0)
Neutrophils: 79 %
Platelets: 400 x10E3/uL (ref 150–450)
RBC: 4.61 x10E6/uL (ref 4.14–5.80)
RDW: 12.9 % (ref 11.6–15.4)
WBC: 17.6 x10E3/uL — ABNORMAL HIGH (ref 3.4–10.8)

## 2019-12-25 LAB — LIPID PANEL
Chol/HDL Ratio: 5.3 ratio — ABNORMAL HIGH (ref 0.0–5.0)
Cholesterol, Total: 191 mg/dL (ref 100–199)
HDL: 36 mg/dL — ABNORMAL LOW
LDL Chol Calc (NIH): 141 mg/dL — ABNORMAL HIGH (ref 0–99)
Triglycerides: 76 mg/dL (ref 0–149)
VLDL Cholesterol Cal: 14 mg/dL (ref 5–40)

## 2019-12-25 LAB — HEPATIC FUNCTION PANEL
ALT: 16 IU/L (ref 0–44)
AST: 9 IU/L (ref 0–40)
Albumin: 4.2 g/dL (ref 4.1–5.2)
Alkaline Phosphatase: 126 IU/L — ABNORMAL HIGH (ref 39–117)
Bilirubin Total: 0.3 mg/dL (ref 0.0–1.2)
Bilirubin, Direct: 0.1 mg/dL (ref 0.00–0.40)
Total Protein: 7.3 g/dL (ref 6.0–8.5)

## 2019-12-27 ENCOUNTER — Ambulatory Visit (HOSPITAL_COMMUNITY)
Admission: RE | Admit: 2019-12-27 | Discharge: 2019-12-27 | Disposition: A | Discharge status: Home / Self Care | Payer: BC Managed Care – PPO | Source: Ambulatory Visit | Attending: Family Medicine | Admitting: Family Medicine

## 2019-12-27 ENCOUNTER — Other Ambulatory Visit: Payer: Self-pay

## 2019-12-27 DIAGNOSIS — R05 Cough: Secondary | ICD-10-CM | POA: Insufficient documentation

## 2019-12-27 DIAGNOSIS — R9389 Abnormal findings on diagnostic imaging of other specified body structures: Secondary | ICD-10-CM | POA: Diagnosis not present

## 2019-12-27 DIAGNOSIS — R590 Localized enlarged lymph nodes: Secondary | ICD-10-CM | POA: Diagnosis not present

## 2019-12-27 MED ORDER — IOHEXOL 300 MG/ML  SOLN
150.0000 mL | Freq: Once | INTRAMUSCULAR | Status: AC | PRN
  Administered 2019-12-27: 150 mL via INTRAVENOUS

## 2019-12-27 NOTE — Telephone Encounter (Signed)
Tyler Kirschner, MD     Call bk if any other symtoms develop,

## 2019-12-27 NOTE — Telephone Encounter (Signed)
Mikey Kirschner, MD     Stop abx, any other symtoms? How high of a fever?

## 2019-12-28 ENCOUNTER — Inpatient Hospital Stay (HOSPITAL_COMMUNITY): Payer: BC Managed Care – PPO | Attending: Hematology | Admitting: Hematology

## 2019-12-28 ENCOUNTER — Encounter (HOSPITAL_COMMUNITY): Payer: Self-pay | Admitting: Hematology

## 2019-12-28 ENCOUNTER — Encounter (HOSPITAL_COMMUNITY): Payer: Self-pay | Admitting: *Deleted

## 2019-12-28 ENCOUNTER — Inpatient Hospital Stay (HOSPITAL_COMMUNITY): Payer: BC Managed Care – PPO

## 2019-12-28 VITALS — BP 159/90 | HR 110 | Temp 98.7°F | Resp 19 | Ht 73.0 in | Wt 259.5 lb

## 2019-12-28 DIAGNOSIS — M25551 Pain in right hip: Secondary | ICD-10-CM | POA: Insufficient documentation

## 2019-12-28 DIAGNOSIS — Z801 Family history of malignant neoplasm of trachea, bronchus and lung: Secondary | ICD-10-CM | POA: Insufficient documentation

## 2019-12-28 DIAGNOSIS — C8190 Hodgkin lymphoma, unspecified, unspecified site: Secondary | ICD-10-CM | POA: Diagnosis not present

## 2019-12-28 DIAGNOSIS — K5900 Constipation, unspecified: Secondary | ICD-10-CM | POA: Insufficient documentation

## 2019-12-28 DIAGNOSIS — R197 Diarrhea, unspecified: Secondary | ICD-10-CM | POA: Diagnosis not present

## 2019-12-28 DIAGNOSIS — R05 Cough: Secondary | ICD-10-CM | POA: Diagnosis not present

## 2019-12-28 DIAGNOSIS — M545 Low back pain: Secondary | ICD-10-CM | POA: Diagnosis not present

## 2019-12-28 DIAGNOSIS — Z803 Family history of malignant neoplasm of breast: Secondary | ICD-10-CM | POA: Insufficient documentation

## 2019-12-28 DIAGNOSIS — R222 Localized swelling, mass and lump, trunk: Secondary | ICD-10-CM

## 2019-12-28 DIAGNOSIS — R590 Localized enlarged lymph nodes: Secondary | ICD-10-CM | POA: Diagnosis not present

## 2019-12-28 DIAGNOSIS — I10 Essential (primary) hypertension: Secondary | ICD-10-CM

## 2019-12-28 DIAGNOSIS — Z79899 Other long term (current) drug therapy: Secondary | ICD-10-CM | POA: Insufficient documentation

## 2019-12-28 DIAGNOSIS — R591 Generalized enlarged lymph nodes: Secondary | ICD-10-CM | POA: Insufficient documentation

## 2019-12-28 DIAGNOSIS — Z5111 Encounter for antineoplastic chemotherapy: Secondary | ICD-10-CM | POA: Diagnosis not present

## 2019-12-28 DIAGNOSIS — M25552 Pain in left hip: Secondary | ICD-10-CM | POA: Insufficient documentation

## 2019-12-28 LAB — URIC ACID: Uric Acid, Serum: 5.2 mg/dL (ref 3.7–8.6)

## 2019-12-28 LAB — HEPATITIS B SURFACE ANTIGEN: Hepatitis B Surface Ag: NONREACTIVE

## 2019-12-28 LAB — HEPATITIS B CORE ANTIBODY, TOTAL: Hep B Core Total Ab: NONREACTIVE

## 2019-12-28 LAB — SEDIMENTATION RATE: Sed Rate: 93 mm/hr — ABNORMAL HIGH (ref 0–16)

## 2019-12-28 LAB — HEPATITIS B SURFACE ANTIBODY,QUALITATIVE: Hep B S Ab: NONREACTIVE

## 2019-12-28 LAB — HIV ANTIBODY (ROUTINE TESTING W REFLEX): HIV Screen 4th Generation wRfx: NONREACTIVE

## 2019-12-28 LAB — LACTATE DEHYDROGENASE: LDH: 227 U/L — ABNORMAL HIGH (ref 98–192)

## 2019-12-28 LAB — HEPATITIS C ANTIBODY: HCV Ab: NONREACTIVE

## 2019-12-28 NOTE — Assessment & Plan Note (Signed)
1.  Left supraclavicular and mediastinal lymphadenopathy: -He felt lymphadenopathy last week.  He was evaluated by Dr. Wolfgang Phoenix and scans were done. -CT of the soft tissue neck dated 12/27/2019 reviewed by me shows left supraclavicular adenopathy measuring 3.8 x 6.6 cm.  Additional small mildly enlarged left cervical lymph nodes. -CT of the chest reviewed by me shows mediastinal adenopathy measuring 3.8 cm in short axis in the prevascular space and 15 mm in short axis anterior to the SVC. -He does not report any recurrent fevers, night sweats or weight loss. -He had one episode of fever over the weekend with temperature of 101.  He had some chills associated with it. -He reports cough mostly dry for the past few months. -We talked about the differential diagnosis including Hodgkin's and non-Hodgkin's lymphoma.  We will check an LDH, uric acid and ESR today.  We will also get hepatitis B and C and HIV testing. -We will order a PET CT scan. -I will request Dr. Benjamine Mola for incision biopsy of left neck supraclavicular adenopathy as soon as possible. -We will also schedule him for port placement.  We will order 2D echocardiogram for baseline LVEF. -We will also consider referring him for semen cryopreservation.

## 2019-12-28 NOTE — Progress Notes (Signed)
[facility] NOTE  Patient Care Team: Mikey Kirschner, MD as PCP - General (Family Medicine)  CHIEF COMPLAINTS/PURPOSE OF CONSULTATION:  Left supraclavicular and mediastinal lymphadenopathy  HISTORY OF PRESENTING ILLNESS:  Tyler [name] 24 y.o. male is seen in consultation today at the request of Dr. Wolfgang Phoenix for further evaluation and management of left supraclavicular and mediastinal adenopathy.  Patient felt the lymph node in the left neck region last week.  He was evaluated by Dr. Wolfgang Phoenix and scans were ordered.  He had one episode of fever going up to 101 over the weekend associated with chills.  He did not have any fever yesterday or today.  Denies any severe fatigue.  Denies any fevers, night sweats or weight loss in the last 6 months.  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  He is a non-smoker.  He drinks alcohol occasionally.  Denies any pain or itching after alcohol consumption in the lymph node region.  He has been treated for high blood pressure since age 8.  He is currently taking Norvasc 10 mg daily.  Family history significant for paternal grandmother with lung and breast cancers.  Denies any significant exposure to pesticides or Roundup.  No generalized pruritus noted.  He also reported cough which started around August of last year.  Cough is mostly dry.  It has gotten slightly worse lately.  Denies any wheezing or stridor.  MEDICAL HISTORY:  Past Medical History:  Diagnosis Date  . ADD (attention deficit disorder)    Inattention type  . Cyclic vomiting syndrome 2009  . GERD (gastroesophageal reflux disease)   . Hypertension     SURGICAL HISTORY: Past Surgical History:  Procedure Laterality Date  . closed reduction rt. wrist      SOCIAL HISTORY: Social History   Socioeconomic History  . Marital status: Single    Spouse name: Not on file  . Number of children: 0  . Years of education: Not on file   . Highest education level: Not on file  Occupational History  . Not on file  Tobacco Use  . Smoking status: Never Smoker  . Smokeless tobacco: Former Network engineer and Sexual Activity  . Alcohol use: Never  . Drug use: Never  . Sexual activity: Not Currently  Other Topics Concern  . Not on file  Social History Narrative  . Not on file   Social Determinants of Health   Financial Resource Strain: Low Risk   . Difficulty of Paying Living Expenses: Not hard at all  Food Insecurity: No Food Insecurity  . Worried About Charity fundraiser in the Last Year: Never true  . Ran Out of Food in the Last Year: Never true  Transportation Needs: No Transportation Needs  . Lack of Transportation (Medical): No  . Lack of Transportation (Non-Medical): No  Physical Activity: Inactive  . Days of Exercise per Week: 0 days  . Minutes of Exercise per Session: 0 min  Stress: No Stress Concern Present  . Feeling of Stress : Not at all  Social Connections: Somewhat Isolated  . Frequency of Communication with Friends and Family: More than three times a week  . Frequency of Social Gatherings with Friends and Family: More than three times a week  . Attends Religious Services: More than 4 times per year  . Active Member of Clubs or Organizations: No  . Attends Archivist Meetings: Never  . Marital Status: Never married  Intimate Partner Violence: Not At Risk  . Fear of Current or Ex-Partner: No  . Emotionally Abused: No  . Physically Abused: No  . Sexually Abused: No    FAMILY HISTORY: Family History  Problem Relation Age of Onset  . Cancer Paternal Grandmother   . Hypertension Mother   . Hypertension Father     ALLERGIES:  is allergic to ace inhibitors.  MEDICATIONS:  Current Outpatient Medications  Medication Sig Dispense Refill  . amLODipine (NORVASC) 10 MG tablet Take 1 tablet (10 mg total) by mouth daily. 90 tablet 1  . omeprazole (PRILOSEC OTC) 20 MG tablet Take 1  tablet (20 mg total) by mouth daily. 90 tablet 1   No current facility-administered medications for this visit.    REVIEW OF SYSTEMS:   Constitutional: Denies fevers, chills or abnormal night sweats Eyes: Denies blurriness of vision, double vision or watery eyes Ears, nose, mouth, throat, and face: Denies mucositis or sore throat Respiratory: Cough, mostly dry for the past few months.  Denies any wheezing or stridor. Cardiovascular: Denies palpitation, chest discomfort or lower extremity swelling Gastrointestinal:  Denies nausea, heartburn or change in bowel habits Skin: Denies abnormal skin rashes Lymphatics: Denies new lymphadenopathy or easy bruising Neurological:Denies numbness, tingling or new weaknesses Behavioral/Psych: Mood is stable, no new changes  All other systems were reviewed with the patient and are negative.  PHYSICAL EXAMINATION: ECOG PERFORMANCE STATUS: 0 - Asymptomatic  Vitals:   12/28/19 1520  BP: (!) 159/90  Pulse: (!) 110  Resp: 19  Temp: 98.7 F (37.1 C)  SpO2: 100%   Filed Weights   12/28/19 1520  Weight: 259 lb 8 oz (117.7 kg)    GENERAL:alert, no distress and comfortable SKIN: skin color, texture, turgor are normal, no rashes or significant lesions EYES: normal, conjunctiva are pink and non-injected, sclera clear OROPHARYNX:no exudate, no erythema and lips, buccal mucosa, and tongue normal  NECK: supple, thyroid normal size, non-tender, without nodularity LYMPH: Left supraclavicular adenopathy palpable.  No palpable axillary or inguinal adenopathy. LUNGS: clear to auscultation and percussion with normal breathing effort HEART: regular rate & rhythm and no murmurs and no lower extremity edema ABDOMEN:abdomen soft, non-tender and normal bowel sounds Musculoskeletal:no cyanosis of digits and no clubbing  PSYCH: alert & oriented x 3 with fluent speech NEURO: no focal motor/sensory deficits  LABORATORY DATA:  I have reviewed the data as  listed Lab Results  Component Value Date   WBC 17.6 (H) 12/24/2019   HGB 12.9 (L) 12/24/2019   HCT 38.8 12/24/2019   MCV 84 12/24/2019   PLT 400 12/24/2019     Chemistry      Component Value Date/Time   NA 140 12/24/2019 1556   K 4.7 12/24/2019 1556   CL 100 12/24/2019 1556   CO2 25 12/24/2019 1556   BUN 13 12/24/2019 1556   CREATININE 0.98 12/24/2019 1556      Component Value Date/Time   CALCIUM 9.3 12/24/2019 1556   ALKPHOS 126 (H) 12/24/2019 1556   AST 9 12/24/2019 1556   ALT 16 12/24/2019 1556   BILITOT 0.3 12/24/2019 1556       RADIOGRAPHIC STUDIES: I have personally reviewed the radiological images as listed and agreed with the findings in the report. DG Chest 2 View  Result Date: 12/23/2019 CLINICAL DATA:  Productive cough for several months. Right supraclavicular lymphadenopathy. EXAM: CHEST - 2 VIEW COMPARISON:  None. FINDINGS: Heart size is normal. A left mediastinal mass is seen which is likely located  in the anterior or middle mediastinum. Both lungs are clear. No evidence of pleural effusion. No skeletal abnormality visualized. IMPRESSION: Left-sided mediastinal mass or lymphadenopathy. Chest CT with contrast is recommended for further evaluation. These results will be called to the ordering clinician or representative by the Radiologist Assistant, and communication documented in the PACS or zVision Dashboard. Electronically Signed   By: Marlaine Hind M.D.   On: 12/23/2019 17:16   CT Soft Tissue Neck W Contrast  Result Date: 12/27/2019 CLINICAL DATA:  Lymphadenopathy, cough EXAM: CT NECK WITH CONTRAST TECHNIQUE: Multidetector CT imaging of the neck was performed using the standard protocol following the bolus administration of intravenous contrast. CONTRAST:  131m OMNIPAQUE IOHEXOL 300 MG/ML  SOLN COMPARISON:  None. FINDINGS: Pharynx and larynx: Prominent adenoids and palatine and lingual tonsils. Otherwise unremarkable. Salivary glands: Parotid and submandibular  glands are unremarkable. Thyroid: Unremarkable. Adenopathy abuts the left lobe but appears separate. Lymph nodes: Left supraclavicular conglomerate adenopathy. Representative axial measurement of 3.8 x 6.6 cm. Additional mildly enlarged and nonenlarged left cervical lymph nodes. For example, left level 3 node or adjacent nodes measuring 1.5 x 2.1 cm. Vascular: Major neck vessels are patent. Limited intracranial: No abnormal enhancement. Visualized orbits: Unremarkable. Mastoids and visualized paranasal sinuses: Minimal mucosal thickening. Mastoids are clear. Skeleton: No significant abnormality Upper chest: Dictated separately. Mediastinal adenopathy is partially imaged. IMPRESSION: Left supraclavicular conglomerate adenopathy. Additional nonenlarged and mildly enlarged cervical lymph nodes, asymmetrically greater on the left. Partially imaged mediastinal adenopathy. This may be infectious/inflammatory or lymphoproliferative in etiology. Electronically Signed   By: PMacy MisM.D.   On: 12/27/2019 17:51   CT Chest W Contrast  Result Date: 12/27/2019 CLINICAL DATA:  24year old male with productive cough and mediastinal mass/adenopathy. EXAM: CT CHEST WITH CONTRAST TECHNIQUE: Multidetector CT imaging of the chest was performed during intravenous contrast administration. CONTRAST:  1534mOMNIPAQUE IOHEXOL 300 MG/ML  SOLN COMPARISON:  Chest radiograph dated 12/23/2019. FINDINGS: Cardiovascular: There is no cardiomegaly or pericardial effusion. The thoracic aorta is unremarkable. The origins of the great vessels of the aortic arch appear patent as visualized. The central pulmonary arteries are grossly unremarkable for the degree of opacification. Mediastinum/Nodes: Mediastinal adenopathy measure 3.8 cm in short axis in the prevascular space, and 15 mm in short axis anterior to the SVC. There is a cluster of enlarged lymph node in the left supraclavicular region with combined dimension of 7.2 x 5.3 cm. There is  encasement, with associated mass effect and compression of the left internal jugular vein. Lungs/Pleura: The lungs are clear. There is no pleural effusion or pneumothorax. The central airways are patent. Upper Abdomen: No acute abnormality. Musculoskeletal: No chest wall abnormality. No acute or significant osseous findings. IMPRESSION: 1. Left supraclavicular and mediastinal lymphadenopathy most concerning for lymphoma. 2. No acute intrathoracic pathology. Electronically Signed   By: ArAnner Crete.D.   On: 12/27/2019 17:42    ASSESSMENT & PLAN:  Lymphadenopathy 1.  Left supraclavicular and mediastinal lymphadenopathy: -He felt lymphadenopathy last week.  He was evaluated by Dr. LuWolfgang Phoenixnd scans were done. -CT of the soft tissue neck dated 12/27/2019 reviewed by me shows left supraclavicular adenopathy measuring 3.8 x 6.6 cm.  Additional small mildly enlarged left cervical lymph nodes. -CT of the chest reviewed by me shows mediastinal adenopathy measuring 3.8 cm in short axis in the prevascular space and 15 mm in short axis anterior to the SVC. -He does not report any recurrent fevers, night sweats or weight loss. -He had one episode  of fever over the weekend with temperature of 101.  He had some chills associated with it. -He reports cough mostly dry for the past few months. -We talked about the differential diagnosis including Hodgkin's and non-Hodgkin's lymphoma.  We will check an LDH, uric acid and ESR today.  We will also get hepatitis B and C and HIV testing. -We will order a PET CT scan. -I will request Dr. Benjamine Mola for incision biopsy of left neck supraclavicular adenopathy as soon as possible. -We will also schedule him for port placement.  We will order 2D echocardiogram for baseline LVEF. -We will also consider referring him for semen cryopreservation.  Orders Placed This Encounter  Procedures  . NM PET Image Initial (PI) Skull Base To Thigh    Standing Status:   Future    Standing  Expiration Date:   12/27/2020    Order Specific Question:   ** REASON FOR EXAM (FREE TEXT)    Answer:   abnormal CT concerning for lymphoma, initial staging    Order Specific Question:   If indicated for the ordered procedure, I authorize the administration of a radiopharmaceutical per Radiology protocol    Answer:   Yes    Order Specific Question:   Preferred imaging location?    Answer:   [facility]    Order Specific Question:   Release to patient    Answer:   Immediate    Order Specific Question:   Radiology Contrast Protocol - do NOT remove file path    Answer:   [email or web address]  . IR IMAGING GUIDED PORT INSERTION    Standing Status:   Future    Standing Expiration Date:   02/26/2021    Order Specific Question:   Reason for Exam (SYMPTOM  OR DIAGNOSIS REQUIRED)    Answer:   chemotherapy administration    Order Specific Question:   Preferred Imaging Location?    Answer:   [facility]    Order Specific Question:   Release to patient    Answer:   Immediate  . Uric acid    Standing Status:   Future    Number of Occurrences:   1    Standing Expiration Date:   12/27/2020  . Lactate dehydrogenase    Standing Status:   Future    Number of Occurrences:   1    Standing Expiration Date:   12/27/2020  . Sedimentation rate    Standing Status:   Future    Number of Occurrences:   1    Standing Expiration Date:   12/27/2020  . HIV antibody (with reflex)  . Hepatitis B surface antigen    Standing Status:   Future    Number of Occurrences:   1    Standing Expiration Date:   12/27/2020  . Hepatitis B surface antibody    Standing Status:   Future    Number of Occurrences:   1    Standing Expiration Date:   12/27/2020  . Hepatitis B core antibody, total  . Hepatitis C antibody  . ECHOCARDIOGRAM COMPLETE    Standing Status:   Future    Standing Expiration Date:   03/29/2021    Order Specific Question:   Where should this test be performed    Answer:   [facility]     Order Specific Question:   Perflutren DEFINITY (image enhancing agent) should be administered unless hypersensitivity or allergy exist    Answer:   Administer Perflutren  Order Specific Question:   Is a special reader required? (athlete or structural heart)    Answer:   No    Order Specific Question:   Reason for exam-Echo    Answer:   Chemotherapy evaluation  v87.41 / v58.11    Order Specific Question:   Release to patient    Answer:   Immediate    All questions were answered. The patient knows to call the clinic with any problems, questions or concerns.      Derek Jack, MD 12/28/2019 5:13 PM

## 2019-12-28 NOTE — Patient Instructions (Addendum)
[place] at [facility] Discharge Instructions  You were seen today by Dr. Delton Coombes. He talked with you about how you have been feeling lately. He discussed your family history.  He talked with you about the differential diagnosis which includes hodgkin's lymphoma.  We are going to need a biopsy of the area on your neck to determine the official diagnosis.  We can treat this condition with chemotherapy with the intention of curing it.  We will need to get a PET scan, echocardiogram and more labs.  We will refer you to Dr. Benjamine Mola for biopsy of neck.  We will refer you to interventional radiology for port a cath placement.  We will see you back in about 3 weeks to go over all of the results and the plan.    We talked to you about your fertility.  With the usual type of cancer and the usual treatment, 90% have no issues with fertility.  We will talk more about this with you at your next appointment.     Thank you for choosing [place] at [facility] to provide your oncology and hematology care.  To afford each patient quality time with our provider, please arrive at least 15 minutes before your scheduled appointment time.   If you have a lab appointment with the [facility] please come in thru the Main Entrance and check in at the main information desk.  You need to re-schedule your appointment should you arrive 10 or more minutes late.  We strive to give you quality time with our providers, and arriving late affects you and other patients whose appointments are after yours.  Also, if you no show three or more times for appointments you may be dismissed from the clinic at the providers discretion.     Again, thank you for choosing [facility].  Our hope is that these requests will decrease the amount of time that you wait before being seen by our physicians.       _____________________________________________________________  Should you  have questions after your visit to [facility], please contact our office at [phone] between the hours of 8:00 a.m. and 4:30 p.m.  Voicemails left after 4:00 p.m. will not be returned until the following business day.  For prescription refill requests, have your pharmacy contact our office and allow 72 hours.    Due to Covid, you will need to wear a mask upon entering the hospital. If you do not have a mask, a mask will be given to you at the Main Entrance upon arrival. For doctor visits, patients may have 1 support person with them. For treatment visits, patients can not have anyone with them due to social distancing guidelines and our immunocompromised population.

## 2019-12-29 ENCOUNTER — Other Ambulatory Visit: Payer: Self-pay | Admitting: Radiology

## 2019-12-29 ENCOUNTER — Other Ambulatory Visit: Payer: Self-pay | Admitting: Otolaryngology

## 2019-12-29 DIAGNOSIS — R590 Localized enlarged lymph nodes: Secondary | ICD-10-CM | POA: Diagnosis not present

## 2019-12-29 DIAGNOSIS — D487 Neoplasm of uncertain behavior of other specified sites: Secondary | ICD-10-CM | POA: Diagnosis not present

## 2019-12-30 ENCOUNTER — Other Ambulatory Visit: Payer: Self-pay

## 2019-12-30 ENCOUNTER — Other Ambulatory Visit (HOSPITAL_COMMUNITY)
Admission: RE | Admit: 2019-12-30 | Discharge: 2019-12-30 | Disposition: A | Discharge status: Home / Self Care | Payer: BC Managed Care – PPO | Source: Ambulatory Visit | Attending: Otolaryngology | Admitting: Otolaryngology

## 2019-12-30 ENCOUNTER — Encounter (HOSPITAL_COMMUNITY): Payer: Self-pay | Admitting: *Deleted

## 2019-12-30 ENCOUNTER — Encounter (HOSPITAL_BASED_OUTPATIENT_CLINIC_OR_DEPARTMENT_OTHER)
Admission: RE | Admit: 2019-12-30 | Discharge: 2019-12-30 | Disposition: A | Discharge status: Home / Self Care | Payer: BC Managed Care – PPO | Source: Ambulatory Visit | Attending: Otolaryngology | Admitting: Otolaryngology

## 2019-12-30 ENCOUNTER — Encounter (HOSPITAL_BASED_OUTPATIENT_CLINIC_OR_DEPARTMENT_OTHER): Payer: Self-pay | Admitting: Otolaryngology

## 2019-12-30 DIAGNOSIS — Z20822 Contact with and (suspected) exposure to covid-19: Secondary | ICD-10-CM | POA: Diagnosis not present

## 2019-12-30 DIAGNOSIS — Z01812 Encounter for preprocedural laboratory examination: Secondary | ICD-10-CM | POA: Insufficient documentation

## 2019-12-30 DIAGNOSIS — E669 Obesity, unspecified: Secondary | ICD-10-CM | POA: Diagnosis not present

## 2019-12-30 DIAGNOSIS — K219 Gastro-esophageal reflux disease without esophagitis: Secondary | ICD-10-CM | POA: Diagnosis not present

## 2019-12-30 DIAGNOSIS — R599 Enlarged lymph nodes, unspecified: Secondary | ICD-10-CM | POA: Diagnosis not present

## 2019-12-30 DIAGNOSIS — Z79899 Other long term (current) drug therapy: Secondary | ICD-10-CM | POA: Diagnosis not present

## 2019-12-30 DIAGNOSIS — I10 Essential (primary) hypertension: Secondary | ICD-10-CM | POA: Diagnosis not present

## 2019-12-30 DIAGNOSIS — Z6830 Body mass index (BMI) 30.0-30.9, adult: Secondary | ICD-10-CM | POA: Diagnosis not present

## 2019-12-30 DIAGNOSIS — C8191 Hodgkin lymphoma, unspecified, lymph nodes of head, face, and neck: Secondary | ICD-10-CM | POA: Diagnosis not present

## 2019-12-30 NOTE — Progress Notes (Signed)
Fertility:  I faxed orders to [facility] lab for semen analysis and cyropreservation of sperm prior to chemotherapy administration.   I had long conversation with patient regarding the risk of damage to sperm with chemotherapy.  I advised that it was ultimately all his decision but with him being young and still having a full life ahead of him, we would encourage him to think about this option.  He was provided with the number to the clinic should he decide to do so.  He has to call and make the appointment for himself, it is not something that the office can do for him.  He verbalizes understanding and states that he still has to think about it.    Appointments:  He advised me that he wants to receive his care closer to his work/home.  I have contacted Drucie Ip, RN for Dr. Lorenso Courier.  I have updated her on patients current plan and his upcoming appointments.  I have provided the contact information for the fertility clinic should she have to contact them for the patient.  I have also updated the patient on his upcoming appointments.  He will follow up with [place] one more visit to review his diagnosis and then we will refer him to Dr. Lorenso Courier for continued care.  Patient verbalizes understanding of plan and is agreeable.

## 2019-12-30 NOTE — Progress Notes (Signed)
Patient had called and left a VM with questions about getting his port placed.  I returned his call and had to leave a message.  I briefly explained the rationale and purpose of a port.  I asked that if he still had any questions to please return call to the clinic.

## 2019-12-31 ENCOUNTER — Other Ambulatory Visit: Payer: Self-pay

## 2019-12-31 ENCOUNTER — Ambulatory Visit (HOSPITAL_COMMUNITY)
Admission: RE | Admit: 2019-12-31 | Discharge: 2019-12-31 | Disposition: A | Discharge status: Home / Self Care | Payer: BC Managed Care – PPO | Source: Ambulatory Visit | Attending: Hematology | Admitting: Hematology

## 2019-12-31 ENCOUNTER — Telehealth: Payer: Self-pay | Admitting: Hematology and Oncology

## 2019-12-31 DIAGNOSIS — F988 Other specified behavioral and emotional disorders with onset usually occurring in childhood and adolescence: Secondary | ICD-10-CM | POA: Diagnosis not present

## 2019-12-31 DIAGNOSIS — C8590 Non-Hodgkin lymphoma, unspecified, unspecified site: Secondary | ICD-10-CM | POA: Insufficient documentation

## 2019-12-31 DIAGNOSIS — R599 Enlarged lymph nodes, unspecified: Secondary | ICD-10-CM | POA: Diagnosis not present

## 2019-12-31 DIAGNOSIS — Z5111 Encounter for antineoplastic chemotherapy: Secondary | ICD-10-CM | POA: Diagnosis not present

## 2019-12-31 DIAGNOSIS — I10 Essential (primary) hypertension: Secondary | ICD-10-CM | POA: Diagnosis not present

## 2019-12-31 DIAGNOSIS — K219 Gastro-esophageal reflux disease without esophagitis: Secondary | ICD-10-CM | POA: Insufficient documentation

## 2019-12-31 DIAGNOSIS — Z79899 Other long term (current) drug therapy: Secondary | ICD-10-CM | POA: Diagnosis not present

## 2019-12-31 DIAGNOSIS — R222 Localized swelling, mass and lump, trunk: Secondary | ICD-10-CM

## 2019-12-31 HISTORY — PX: IR IMAGING GUIDED PORT INSERTION: IMG5740

## 2019-12-31 LAB — SARS CORONAVIRUS 2 (TAT 6-24 HRS): SARS Coronavirus 2: NEGATIVE

## 2019-12-31 MED ORDER — SODIUM CHLORIDE 0.9 % IV SOLN: INTRAVENOUS | Status: DC

## 2019-12-31 MED ORDER — MIDAZOLAM HCL 2 MG/2ML IJ SOLN
INTRAMUSCULAR | Status: AC
  Filled 2019-12-31: qty 2

## 2019-12-31 MED ORDER — LIDOCAINE HCL 1 % IJ SOLN
INTRAMUSCULAR | Status: AC | PRN
  Administered 2019-12-31: 15 mL

## 2019-12-31 MED ORDER — FENTANYL CITRATE (PF) 100 MCG/2ML IJ SOLN
INTRAMUSCULAR | Status: AC | PRN
  Administered 2019-12-31 (×2): 50 ug via INTRAVENOUS

## 2019-12-31 MED ORDER — CEFAZOLIN SODIUM-DEXTROSE 2-4 GM/100ML-% IV SOLN
2.0000 g | INTRAVENOUS | Status: AC
  Administered 2019-12-31: 2 g via INTRAVENOUS

## 2019-12-31 MED ORDER — MIDAZOLAM HCL 2 MG/2ML IJ SOLN
INTRAMUSCULAR | Status: AC | PRN
  Administered 2019-12-31 (×3): 1 mg via INTRAVENOUS

## 2019-12-31 MED ORDER — HEPARIN SOD (PORK) LOCK FLUSH 100 UNIT/ML IV SOLN
INTRAVENOUS | Status: AC
  Filled 2019-12-31: qty 5

## 2019-12-31 MED ORDER — LIDOCAINE-EPINEPHRINE 1 %-1:100000 IJ SOLN
INTRAMUSCULAR | Status: AC
  Filled 2019-12-31: qty 1

## 2019-12-31 MED ORDER — FENTANYL CITRATE (PF) 100 MCG/2ML IJ SOLN
INTRAMUSCULAR | Status: AC
  Filled 2019-12-31: qty 2

## 2019-12-31 NOTE — Consult Note (Signed)
Chief Complaint: Chemotherapy access  Referring Physician(s): Katragadda,Sreedhar  Supervising Physician: Arne Cleveland  Patient Status: [facility] - Out-pt  History of Present Illness: Tyler [name] is a 24 y.o. male  History of left supraclavicular and medistinal adenopathy found to be lymphoma. Team is requesting portacath for chemotherapy access. Shows RIJ is accessible   Past Medical History:  Diagnosis Date  . ADD (attention deficit disorder)    Inattention type  . Cyclic vomiting syndrome 2009  . GERD (gastroesophageal reflux disease)   . Hypertension     Past Surgical History:  Procedure Laterality Date  . closed reduction rt. wrist      Allergies: Ace inhibitors  Medications: Prior to Admission medications   Medication Sig Start Date End Date Taking? Authorizing Provider  acetaminophen (TYLENOL) 325 MG tablet Take 650 mg by mouth 2 (two) times daily as needed for moderate pain or fever.   Yes [provider]  amLODipine (NORVASC) 10 MG tablet Take 1 tablet (10 mg total) by mouth daily. 08/30/19  Yes Mikey Kirschner, MD  omeprazole (PRILOSEC OTC) 20 MG tablet Take 1 tablet (20 mg total) by mouth daily. 08/30/19  Yes Mikey Kirschner, MD  sodium chloride (OCEAN) 0.65 % SOLN nasal spray Place 1 spray into both nostrils as needed for congestion.   Yes [provider]     Family History  Problem Relation Age of Onset  . Cancer Paternal Grandmother   . Hypertension Mother   . Hypertension Father     Social History   Socioeconomic History  . Marital status: Single    Spouse name: Not on file  . Number of children: 0  . Years of education: Not on file  . Highest education level: Not on file  Occupational History  . Not on file  Tobacco Use  . Smoking status: Never Smoker  . Smokeless tobacco: Former Network engineer and Sexual Activity  . Alcohol use: Never  . Drug use: Never  . Sexual activity: Not Currently  Other Topics Concern   . Not on file  Social History Narrative  . Not on file   Social Determinants of Health   Financial Resource Strain: Low Risk   . Difficulty of Paying Living Expenses: Not hard at all  Food Insecurity: No Food Insecurity  . Worried About Charity fundraiser in the Last Year: Never true  . Ran Out of Food in the Last Year: Never true  Transportation Needs: No Transportation Needs  . Lack of Transportation (Medical): No  . Lack of Transportation (Non-Medical): No  Physical Activity: Inactive  . Days of Exercise per Week: 0 days  . Minutes of Exercise per Session: 0 min  Stress: No Stress Concern Present  . Feeling of Stress : Not at all  Social Connections: Somewhat Isolated  . Frequency of Communication with Friends and Family: More than three times a week  . Frequency of Social Gatherings with Friends and Family: More than three times a week  . Attends Religious Services: More than 4 times per year  . Active Member of Clubs or Organizations: No  . Attends Archivist Meetings: Never  . Marital Status: Never married    Review of Systems: A 12 point ROS discussed and pertinent positives are indicated in the HPI above.  All other systems are negative.  Review of Systems  Constitutional: Negative for fever.  HENT: Negative for congestion.        Left sided neck  swelling.  Respiratory: Positive for cough and shortness of breath.   Cardiovascular: Negative for chest pain.  Gastrointestinal: Negative for abdominal pain.  Neurological: Negative for headaches.  Psychiatric/Behavioral: Negative for behavioral problems and confusion.    Vital Signs: BP (!) 154/88   Pulse (!) 108   Temp 98.6 F (37 C) (Oral)   Ht 6\' 3"  (1.905 m)   Wt 260 lb (117.9 kg)   SpO2 99%   BMI 32.50 kg/m   Physical Exam Vitals and nursing note reviewed.  Constitutional:      Appearance: He is well-developed.  HENT:     Head: Normocephalic.  Neck:     Comments: Left supraclavicular  lymphadenopathy Cardiovascular:     Rate and Rhythm: Normal rate and regular rhythm.     Heart sounds: Normal heart sounds.  Pulmonary:     Effort: Pulmonary effort is normal.  Musculoskeletal:        General: Normal range of motion.     Cervical back: Normal range of motion.  Skin:    General: Skin is dry.  Neurological:     Mental Status: He is alert and oriented to person, place, and time.     Imaging: DG Chest 2 View  Result Date: 12/23/2019 CLINICAL DATA:  Productive cough for several months. Right supraclavicular lymphadenopathy. EXAM: CHEST - 2 VIEW COMPARISON:  None. FINDINGS: Heart size is normal. A left mediastinal mass is seen which is likely located in the anterior or middle mediastinum. Both lungs are clear. No evidence of pleural effusion. No skeletal abnormality visualized. IMPRESSION: Left-sided mediastinal mass or lymphadenopathy. Chest CT with contrast is recommended for further evaluation. These results will be called to the ordering clinician or representative by the Radiologist Assistant, and communication documented in the PACS or zVision Dashboard. Electronically Signed   By: Marlaine Hind M.D.   On: 12/23/2019 17:16   CT Soft Tissue Neck W Contrast  Result Date: 12/27/2019 CLINICAL DATA:  Lymphadenopathy, cough EXAM: CT NECK WITH CONTRAST TECHNIQUE: Multidetector CT imaging of the neck was performed using the standard protocol following the bolus administration of intravenous contrast. CONTRAST:  130mL OMNIPAQUE IOHEXOL 300 MG/ML  SOLN COMPARISON:  None. FINDINGS: Pharynx and larynx: Prominent adenoids and palatine and lingual tonsils. Otherwise unremarkable. Salivary glands: Parotid and submandibular glands are unremarkable. Thyroid: Unremarkable. Adenopathy abuts the left lobe but appears separate. Lymph nodes: Left supraclavicular conglomerate adenopathy. Representative axial measurement of 3.8 x 6.6 cm. Additional mildly enlarged and nonenlarged left cervical lymph  nodes. For example, left level 3 node or adjacent nodes measuring 1.5 x 2.1 cm. Vascular: Major neck vessels are patent. Limited intracranial: No abnormal enhancement. Visualized orbits: Unremarkable. Mastoids and visualized paranasal sinuses: Minimal mucosal thickening. Mastoids are clear. Skeleton: No significant abnormality Upper chest: Dictated separately. Mediastinal adenopathy is partially imaged. IMPRESSION: Left supraclavicular conglomerate adenopathy. Additional nonenlarged and mildly enlarged cervical lymph nodes, asymmetrically greater on the left. Partially imaged mediastinal adenopathy. This may be infectious/inflammatory or lymphoproliferative in etiology. Electronically Signed   By: Macy Mis M.D.   On: 12/27/2019 17:51   CT Chest W Contrast  Result Date: 12/27/2019 CLINICAL DATA:  24 year old male with productive cough and mediastinal mass/adenopathy. EXAM: CT CHEST WITH CONTRAST TECHNIQUE: Multidetector CT imaging of the chest was performed during intravenous contrast administration. CONTRAST:  168mL OMNIPAQUE IOHEXOL 300 MG/ML  SOLN COMPARISON:  Chest radiograph dated 12/23/2019. FINDINGS: Cardiovascular: There is no cardiomegaly or pericardial effusion. The thoracic aorta is unremarkable. The origins of the great  vessels of the aortic arch appear patent as visualized. The central pulmonary arteries are grossly unremarkable for the degree of opacification. Mediastinum/Nodes: Mediastinal adenopathy measure 3.8 cm in short axis in the prevascular space, and 15 mm in short axis anterior to the SVC. There is a cluster of enlarged lymph node in the left supraclavicular region with combined dimension of 7.2 x 5.3 cm. There is encasement, with associated mass effect and compression of the left internal jugular vein. Lungs/Pleura: The lungs are clear. There is no pleural effusion or pneumothorax. The central airways are patent. Upper Abdomen: No acute abnormality. Musculoskeletal: No chest wall  abnormality. No acute or significant osseous findings. IMPRESSION: 1. Left supraclavicular and mediastinal lymphadenopathy most concerning for lymphoma. 2. No acute intrathoracic pathology. Electronically Signed   By: Anner Crete M.D.   On: 12/27/2019 17:42    Labs:  CBC: Recent Labs    12/24/19 1556  WBC 17.6*  HGB 12.9*  HCT 38.8  PLT 400    COAGS: No results for input(s): INR, APTT in the last 8760 hours.  BMP: Recent Labs    12/24/19 1556  NA 140  K 4.7  CL 100  CO2 25  GLUCOSE 97  BUN 13  CALCIUM 9.3  CREATININE 0.98  GFRNONAA 108  GFRAA 125    LIVER FUNCTION TESTS: Recent Labs    12/24/19 1556  BILITOT 0.3  AST 9  ALT 16  ALKPHOS 126*  PROT 7.3  ALBUMIN 4.2    TUMOR MARKERS: No results for input(s): AFPTM, CEA, CA199, CHROMGRNA in the last 8760 hours.  Assessment and Plan:  24 y.o, male outpatient. History of left supraclavicular and medistinal adenopathy found to be lymphoma. Team is requesting portacath for chemotherapy access. Shows RIJ is accessible  Pertinent Imaging 3.1.21 - CT chest reads Left supraclavicular and mediastinal lymphadenopathy most concerning for lymphoma.  Pertinent IR History none  Pertinent Allergies none   All labs and medications are within acceptable parameters. Patient is afebrile.  Risks and benefits of image guided port-a-catheter placement was discussed with the patient including, but not limited to bleeding, infection, pneumothorax, or fibrin sheath development and need for additional procedures.  All of the patient's questions were answered, patient is agreeable to proceed. Consent signed and in chart.    Thank you for this interesting consult.  I greatly enjoyed meeting Tyler [name] and look forward to participating in their care.  A copy of this report was sent to the requesting provider on this date.  Electronically Signed: Avel Peace, NP 12/31/2019, 8:42 AM   I spent a total  of  40 Minutes   in face to face in clinical consultation, greater than 50% of which was counseling/coordinating care for portacath placement

## 2019-12-31 NOTE — Procedures (Signed)
  Procedure: R IJ Port placement   EBL:   minimal Complications:  none immediate  See full dictation in [facility] PACS.  D. Rockford Leinen MD Main # [phone] Pager  [phone]    

## 2019-12-31 NOTE — Discharge Instructions (Signed)
Implanted Port Insertion, Care After °This sheet gives you information about how to care for yourself after your procedure. Your health care provider may also give you more specific instructions. If you have problems or questions, contact your health care provider. °What can I expect after the procedure? °After the procedure, it is common to have: °· Discomfort at the port insertion site. °· Bruising on the skin over the port. This should improve over 3-4 days. °Follow these instructions at home: °Port care °· After your port is placed, you will get a manufacturer's information card. The card has information about your port. Keep this card with you at all times. °· Take care of the port as told by your health care provider. Ask your health care provider if you or a family member can get training for taking care of the port at home. A home health care nurse may also take care of the port. °· Make sure to remember what type of port you have. °Incision care ° °  ° °· Follow instructions from your health care provider about how to take care of your port insertion site. Make sure you: °? Wash your hands with soap and water before and after you change your bandage (dressing). If soap and water are not available, use hand sanitizer. °? Change your dressing as told by your health care provider. °? Leave stitches (sutures), skin glue, or adhesive strips in place. These skin closures may need to stay in place for 2 weeks or longer. If adhesive strip edges start to loosen and curl up, you may trim the loose edges. Do not remove adhesive strips completely unless your health care provider tells you to do that. °· Check your port insertion site every day for signs of infection. Check for: °? Redness, swelling, or pain. °? Fluid or blood. °? Warmth. °? Pus or a bad smell. °Activity °· Return to your normal activities as told by your health care provider. Ask your health care provider what activities are safe for you. °· Do not  lift anything that is heavier than 10 lb (4.5 kg), or the limit that you are told, until your health care provider says that it is safe. °General instructions °· Take over-the-counter and prescription medicines only as told by your health care provider. °· Do not take baths, swim, or use a hot tub until your health care provider approves. Ask your health care provider if you may take showers. You may only be allowed to take sponge baths. °· Do not drive for 24 hours if you were given a sedative during your procedure. °· Wear a medical alert bracelet in case of an emergency. This will tell any health care providers that you have a port. °· Keep all follow-up visits as told by your health care provider. This is important. °Contact a health care provider if: °· You cannot flush your port with saline as directed, or you cannot draw blood from the port. °· You have a fever or chills. °· You have redness, swelling, or pain around your port insertion site. °· You have fluid or blood coming from your port insertion site. °· Your port insertion site feels warm to the touch. °· You have pus or a bad smell coming from the port insertion site. °Get help right away if: °· You have chest pain or shortness of breath. °· You have bleeding from your port that you cannot control. °Summary °· Take care of the port as told by your health   care provider. Keep the manufacturer's information card with you at all times. °· Change your dressing as told by your health care provider. °· Contact a health care provider if you have a fever or chills or if you have redness, swelling, or pain around your port insertion site. °· Keep all follow-up visits as told by your health care provider. °This information is not intended to replace advice given to you by your health care provider. Make sure you discuss any questions you have with your health care provider. °Document Revised: 05/12/2018 Document Reviewed: 05/12/2018 °Elsevier Patient Education ©  2020 [employer] ° °

## 2019-12-31 NOTE — Telephone Encounter (Signed)
Received a msg from [facility] stating the pt would like to transfer his care to [place]. Pt has been scheduled to see Dr. Lorenso Courier on 3/18 at 1pm. Nurse from [facility] will notify the pt of the appt when he comes in on 3/11.

## 2019-12-31 NOTE — Progress Notes (Signed)
Late entry:    I met with patient and his aunt during the visit with Dr. Delton Coombes. I explained how I am involved in his care. I provided my contact information.  Patient and his aunt were given the opportunity to ask questions and all were answered to their satisfaction.

## 2019-12-31 NOTE — Progress Notes (Signed)
Discharge instructions reviewed with patient and family. Verbalized understanding. 

## 2020-01-03 ENCOUNTER — Ambulatory Visit (HOSPITAL_BASED_OUTPATIENT_CLINIC_OR_DEPARTMENT_OTHER): Payer: BC Managed Care – PPO | Admitting: Anesthesiology

## 2020-01-03 ENCOUNTER — Encounter (HOSPITAL_BASED_OUTPATIENT_CLINIC_OR_DEPARTMENT_OTHER): Payer: Self-pay | Admitting: Otolaryngology

## 2020-01-03 ENCOUNTER — Other Ambulatory Visit: Payer: Self-pay

## 2020-01-03 ENCOUNTER — Ambulatory Visit (HOSPITAL_BASED_OUTPATIENT_CLINIC_OR_DEPARTMENT_OTHER)
Admission: RE | Admit: 2020-01-03 | Discharge: 2020-01-03 | Disposition: A | Discharge status: Home / Self Care | Payer: BC Managed Care – PPO | Attending: Otolaryngology | Admitting: Otolaryngology

## 2020-01-03 ENCOUNTER — Encounter (HOSPITAL_BASED_OUTPATIENT_CLINIC_OR_DEPARTMENT_OTHER)
Admission: RE | Disposition: A | Discharge status: Home / Self Care | Payer: Self-pay | Source: Home / Self Care | Attending: Otolaryngology

## 2020-01-03 DIAGNOSIS — C8191 Hodgkin lymphoma, unspecified, lymph nodes of head, face, and neck: Secondary | ICD-10-CM | POA: Diagnosis not present

## 2020-01-03 DIAGNOSIS — E669 Obesity, unspecified: Secondary | ICD-10-CM | POA: Diagnosis not present

## 2020-01-03 DIAGNOSIS — Z79899 Other long term (current) drug therapy: Secondary | ICD-10-CM | POA: Insufficient documentation

## 2020-01-03 DIAGNOSIS — K219 Gastro-esophageal reflux disease without esophagitis: Secondary | ICD-10-CM | POA: Insufficient documentation

## 2020-01-03 DIAGNOSIS — I10 Essential (primary) hypertension: Secondary | ICD-10-CM | POA: Insufficient documentation

## 2020-01-03 DIAGNOSIS — Z6830 Body mass index (BMI) 30.0-30.9, adult: Secondary | ICD-10-CM | POA: Insufficient documentation

## 2020-01-03 DIAGNOSIS — C8179 Other classical Hodgkin lymphoma, extranodal and solid organ sites: Secondary | ICD-10-CM | POA: Diagnosis not present

## 2020-01-03 DIAGNOSIS — R599 Enlarged lymph nodes, unspecified: Secondary | ICD-10-CM | POA: Diagnosis not present

## 2020-01-03 DIAGNOSIS — R590 Localized enlarged lymph nodes: Secondary | ICD-10-CM | POA: Diagnosis not present

## 2020-01-03 HISTORY — PX: MASS BIOPSY: SHX5445

## 2020-01-03 SURGERY — BIOPSY, MASS, NECK
Anesthesia: General | Site: Neck | Laterality: Left

## 2020-01-03 MED ORDER — FENTANYL CITRATE (PF) 100 MCG/2ML IJ SOLN
INTRAMUSCULAR | Status: AC
  Filled 2020-01-03: qty 2

## 2020-01-03 MED ORDER — LIDOCAINE 2% (20 MG/ML) 5 ML SYRINGE
INTRAMUSCULAR | Status: DC | PRN
  Administered 2020-01-03: 80 mg via INTRAVENOUS

## 2020-01-03 MED ORDER — SUCCINYLCHOLINE CHLORIDE 20 MG/ML IJ SOLN
INTRAMUSCULAR | Status: DC | PRN
  Administered 2020-01-03: 100 mg via INTRAVENOUS

## 2020-01-03 MED ORDER — MIDAZOLAM HCL 2 MG/2ML IJ SOLN
INTRAMUSCULAR | Status: AC
  Filled 2020-01-03: qty 2

## 2020-01-03 MED ORDER — LACTATED RINGERS IV SOLN: INTRAVENOUS | Status: DC

## 2020-01-03 MED ORDER — MIDAZOLAM HCL 5 MG/5ML IJ SOLN
INTRAMUSCULAR | Status: DC | PRN
  Administered 2020-01-03: 2 mg via INTRAVENOUS

## 2020-01-03 MED ORDER — CEFAZOLIN SODIUM-DEXTROSE 2-3 GM-%(50ML) IV SOLR
INTRAVENOUS | Status: DC | PRN
  Administered 2020-01-03: 2 g via INTRAVENOUS

## 2020-01-03 MED ORDER — LIDOCAINE 2% (20 MG/ML) 5 ML SYRINGE
INTRAMUSCULAR | Status: AC
  Filled 2020-01-03: qty 5

## 2020-01-03 MED ORDER — FENTANYL CITRATE (PF) 100 MCG/2ML IJ SOLN: 25.0000 ug | INTRAMUSCULAR | Status: DC | PRN

## 2020-01-03 MED ORDER — SUCCINYLCHOLINE CHLORIDE 200 MG/10ML IV SOSY
PREFILLED_SYRINGE | INTRAVENOUS | Status: AC
  Filled 2020-01-03: qty 10

## 2020-01-03 MED ORDER — ONDANSETRON HCL 4 MG/2ML IJ SOLN
INTRAMUSCULAR | Status: DC | PRN
  Administered 2020-01-03: 4 mg via INTRAVENOUS

## 2020-01-03 MED ORDER — FENTANYL CITRATE (PF) 100 MCG/2ML IJ SOLN
INTRAMUSCULAR | Status: DC | PRN
  Administered 2020-01-03 (×2): 50 ug via INTRAVENOUS
  Administered 2020-01-03: 100 ug via INTRAVENOUS

## 2020-01-03 MED ORDER — OXYCODONE-ACETAMINOPHEN 5-325 MG PO TABS: 1.0000 | ORAL_TABLET | ORAL | 0 refills | Status: AC | PRN

## 2020-01-03 MED ORDER — OXYCODONE HCL 5 MG/5ML PO SOLN: 5.0000 mg | Freq: Once | ORAL | Status: DC | PRN

## 2020-01-03 MED ORDER — ONDANSETRON HCL 4 MG/2ML IJ SOLN
INTRAMUSCULAR | Status: AC
  Filled 2020-01-03: qty 2

## 2020-01-03 MED ORDER — LIDOCAINE-EPINEPHRINE 1 %-1:100000 IJ SOLN
INTRAMUSCULAR | Status: DC | PRN
  Administered 2020-01-03: 2 mL

## 2020-01-03 MED ORDER — OXYCODONE HCL 5 MG PO TABS: 5.0000 mg | ORAL_TABLET | Freq: Once | ORAL | Status: DC | PRN

## 2020-01-03 MED ORDER — PROMETHAZINE HCL 25 MG/ML IJ SOLN: 6.2500 mg | INTRAMUSCULAR | Status: DC | PRN

## 2020-01-03 MED ORDER — PROPOFOL 10 MG/ML IV BOLUS
INTRAVENOUS | Status: DC | PRN
  Administered 2020-01-03: 200 mg via INTRAVENOUS

## 2020-01-03 SURGICAL SUPPLY — 78 items
ADH SKN CLS APL DERMABOND .7 (GAUZE/BANDAGES/DRESSINGS) ×1
APL SKNCLS STERI-STRIP NONHPOA (GAUZE/BANDAGES/DRESSINGS)
BENZOIN TINCTURE PRP APPL 2/3 (GAUZE/BANDAGES/DRESSINGS) IMPLANT
BLADE SURG 15 STRL LF DISP TIS (BLADE) ×1 IMPLANT
BLADE SURG 15 STRL SS (BLADE) ×2
CANISTER SUCT 1200ML W/VALVE (MISCELLANEOUS) ×1 IMPLANT
CLEANER CAUTERY TIP 5X5 PAD (MISCELLANEOUS) IMPLANT
CLIP VESOCCLUDE MED 6/CT (CLIP) IMPLANT
CLIP VESOCCLUDE SM WIDE 6/CT (CLIP) IMPLANT
CORD BIPOLAR FORCEPS 12FT (ELECTRODE) IMPLANT
COVER BACK TABLE 60X90IN (DRAPES) ×2 IMPLANT
COVER MAYO STAND STRL (DRAPES) ×2 IMPLANT
COVER WAND RF STERILE (DRAPES) IMPLANT
DECANTER SPIKE VIAL GLASS SM (MISCELLANEOUS) IMPLANT
DERMABOND ADVANCED (GAUZE/BANDAGES/DRESSINGS) ×1
DERMABOND ADVANCED .7 DNX12 (GAUZE/BANDAGES/DRESSINGS) IMPLANT
DRAIN JACKSON RD 7FR 3/32 (WOUND CARE) IMPLANT
DRAIN PENROSE 1/4X12 LTX STRL (WOUND CARE) IMPLANT
DRAIN TLS ROUND 10FR (DRAIN) IMPLANT
DRAPE U-SHAPE 76X120 STRL (DRAPES) ×2 IMPLANT
ELECT COATED BLADE 2.86 ST (ELECTRODE) ×2 IMPLANT
ELECT NDL BLADE 2-5/6 (NEEDLE) IMPLANT
ELECT NEEDLE BLADE 2-5/6 (NEEDLE) IMPLANT
ELECT PAIRED SUBDERMAL (MISCELLANEOUS)
ELECT REM PT RETURN 9FT ADLT (ELECTROSURGICAL) ×2
ELECTRODE PAIRED SUBDERMAL (MISCELLANEOUS) IMPLANT
ELECTRODE REM PT RTRN 9FT ADLT (ELECTROSURGICAL) ×1 IMPLANT
EVACUATOR SILICONE 100CC (DRAIN) IMPLANT
FORCEPS BIPOLAR SPETZLER 8 1.0 (NEUROSURGERY SUPPLIES) IMPLANT
GAUZE 4X4 16PLY RFD (DISPOSABLE) IMPLANT
GAUZE SPONGE 4X4 12PLY STRL LF (GAUZE/BANDAGES/DRESSINGS) IMPLANT
GLOVE BIO SURGEON STRL SZ7 (GLOVE) ×1 IMPLANT
GLOVE BIO SURGEON STRL SZ7.5 (GLOVE) ×2 IMPLANT
GLOVE BIOGEL PI IND STRL 7.0 (GLOVE) IMPLANT
GLOVE BIOGEL PI IND STRL 7.5 (GLOVE) IMPLANT
GLOVE BIOGEL PI INDICATOR 7.0 (GLOVE) ×1
GLOVE BIOGEL PI INDICATOR 7.5 (GLOVE) ×1
GOWN STRL REUS W/ TWL LRG LVL3 (GOWN DISPOSABLE) ×2 IMPLANT
GOWN STRL REUS W/ TWL XL LVL3 (GOWN DISPOSABLE) IMPLANT
GOWN STRL REUS W/TWL LRG LVL3 (GOWN DISPOSABLE) ×2
GOWN STRL REUS W/TWL XL LVL3 (GOWN DISPOSABLE) ×2
HEMOSTAT SNOW SURGICEL 2X4 (HEMOSTASIS) ×1 IMPLANT
HEMOSTAT SURGICEL .5X2 ABSORB (HEMOSTASIS) IMPLANT
LOCATOR NERVE 3 VOLT (DISPOSABLE) IMPLANT
NDL HYPO 25X1 1.5 SAFETY (NEEDLE) ×1 IMPLANT
NDL PRECISIONGLIDE 27X1.5 (NEEDLE) IMPLANT
NEEDLE HYPO 25X1 1.5 SAFETY (NEEDLE) ×2 IMPLANT
NEEDLE PRECISIONGLIDE 27X1.5 (NEEDLE) IMPLANT
NS IRRIG 1000ML POUR BTL (IV SOLUTION) ×2 IMPLANT
PACK BASIN DAY SURGERY FS (CUSTOM PROCEDURE TRAY) ×2 IMPLANT
PAD CLEANER CAUTERY TIP 5X5 (MISCELLANEOUS)
PENCIL SMOKE EVACUATOR (MISCELLANEOUS) ×2 IMPLANT
PIN SAFETY STERILE (MISCELLANEOUS) IMPLANT
PROBE NERVBE PRASS .33 (MISCELLANEOUS) IMPLANT
SHEARS HARMONIC 9CM CVD (BLADE) IMPLANT
SLEEVE SCD COMPRESS KNEE MED (MISCELLANEOUS) ×1 IMPLANT
SPONGE GAUZE 2X2 8PLY STRL LF (GAUZE/BANDAGES/DRESSINGS) IMPLANT
STAPLER VISISTAT 35W (STAPLE) IMPLANT
STRIP CLOSURE SKIN 1/4X4 (GAUZE/BANDAGES/DRESSINGS) IMPLANT
SUCTION FRAZIER HANDLE 10FR (MISCELLANEOUS)
SUCTION TUBE FRAZIER 10FR DISP (MISCELLANEOUS) IMPLANT
SUT ETHILON 3 0 PS 1 (SUTURE) IMPLANT
SUT ETHILON 4 0 PS 2 18 (SUTURE) IMPLANT
SUT PROLENE 5 0 P 3 (SUTURE) IMPLANT
SUT SILK 3 0 TIES 17X18 (SUTURE)
SUT SILK 3-0 18XBRD TIE BLK (SUTURE) IMPLANT
SUT SILK 4 0 TIES 17X18 (SUTURE) IMPLANT
SUT VIC AB 3-0 FS2 27 (SUTURE) IMPLANT
SUT VIC AB 4-0 P-3 18XBRD (SUTURE) IMPLANT
SUT VIC AB 4-0 P3 18 (SUTURE)
SUT VIC AB 4-0 RB1 27 (SUTURE)
SUT VIC AB 4-0 RB1 27X BRD (SUTURE) IMPLANT
SUT VICRYL 4-0 PS2 18IN ABS (SUTURE) ×2 IMPLANT
SYR BULB 3OZ (MISCELLANEOUS) ×1 IMPLANT
SYR CONTROL 10ML LL (SYRINGE) ×2 IMPLANT
TOWEL GREEN STERILE FF (TOWEL DISPOSABLE) ×2 IMPLANT
TRAY DSU PREP LF (CUSTOM PROCEDURE TRAY) ×2 IMPLANT
TUBE CONNECTING 20X1/4 (TUBING) ×1 IMPLANT

## 2020-01-03 NOTE — H&P (Signed)
Cc: Neck mass  HPI: The patient is a 24 year old male who presents today for evaluation of his left supraclavicular mass.  The patient is seen in consultation requested by [facility].  According to the patient, he first noted the left supraclavicular mass 1 week ago.  He was evaluated by Dr. Mickie Hillier.  He underwent a neck CT scan, which showed large 6.6 adenopathy in his left lower neck.  In addition, he also has a 2.1 cm left Level III lymph node.  Multiple mediastinal adenopathy was also noted.  The findings were concerning for malignancy.  The patient also reports experiencing night sweats and fever over the weekend.  He has been taking Tylenol/Ibuprofen to treat his symptoms. The patient has a history of hypertension.  He is otherwise healthy.  He has no previous history of tobacco use.   The patient's review of systems (constitutional, eyes, ENT, cardiovascular, respiratory, GI, musculoskeletal, skin, neurologic, psychiatric, endocrine, hematologic, allergic) is noted in the ROS questionnaire.  It is reviewed with the patient.  Family health history: Hypertension.  Major events: None.  Ongoing medical problems: Night sweats, hypertension.  Social history: The patient is single. He denies the use of tobacco or illegal drugs. He drinks alcohol once a week.    Exam: General: Communicates without difficulty, well nourished, no acute distress. Head: Normocephalic, no evidence injury, no tenderness, facial buttresses intact without stepoff. Face/sinus: No tenderness to palpation and percussion. Facial movement is normal and symmetric. Eyes: PERRL, EOMI. No scleral icterus, conjunctivae clear. Neuro: CN II exam reveals vision grossly intact.  No nystagmus at any point of gaze. Ears: Auricles well formed without lesions.  Ear canals are intact without mass or lesion.  No erythema or edema is appreciated.  The TMs are intact without fluid. Nose: External evaluation reveals normal support  and skin without lesions.  Dorsum is intact.  Anterior rhinoscopy reveals pink mucosa over anterior aspect of inferior turbinates and intact septum.  No purulence noted. Oral:  Oral cavity and oropharynx are intact, symmetric, without erythema or edema.  Mucosa is moist without lesions. Neck: Full range of motion without pain.  A large 6 cm palpable firm mass is noted in his left supraclavicular neck.  Multiple smaller lymph nodes are also palpable in his left Level II and Level III neck. Neuro:  CN 2-12 grossly intact. Gait normal.   Procedure:  Flexible Fiberoptic Laryngoscopy Risks, benefits, and alternatives of flexible endoscopy were explained to the patient.  Specific mention was made of the risk of throat numbness with difficulty swallowing, possible bleeding from the nose and mouth, and pain from the procedure.  The patient gave oral consent to proceed.  The nasal cavities were decongested and anesthetised with a combination of oxymetazoline and 4% lidocaine solution.  The flexible scope was inserted into the right nasal cavity and advanced towards the nasopharynx.  Visualized mucosa over the turbinates and septum were as described above.  The nasopharynx was clear.  Oropharyngeal walls were symmetric and mobile without lesion, mass, or edema.  Hypopharynx was also without  lesion or edema.  Larynx was mobile without lesions. Supraglottic structures were free of edema, mass, and asymmetry.  True vocal folds were white without mass or lesion.  Base of tongue was within normal limits.   Assessment 1.  A large 6 cm palpable firm mass is noted in his left supraclavicular neck.  Multiple smaller lymph nodes are also palpable in his left Level II and Level III  neck.  The findings are concerning for malignancy, such as a lymphoma.  2.  The patient has a normal laryngoscopy examination today.  No suspicious mass or lesion is noted on his pharyngeal or laryngeal mucosa.   Plan  1.  The physical exam and  laryngoscopy findings are reviewed with the patient.  2.  Based on the above findings, we will proceed with an open biopsy of his left neck mass as soon as possible.  The risks, benefits, alternatives and details of the procedure are reviewed with the patient.  Questions are invited and answered.  3.  The patient would like to proceed with the procedure.

## 2020-01-03 NOTE — Anesthesia Postprocedure Evaluation (Signed)
Anesthesia Post Note  Patient: DAMEIN BATTIEST  Procedure(s) Performed: OPEN NECK BIOPSY (Left Neck)     Patient location during evaluation: PACU Anesthesia Type: General Level of consciousness: awake and alert Pain management: pain level controlled Vital Signs Assessment: post-procedure vital signs reviewed and stable Respiratory status: spontaneous breathing, nonlabored ventilation and respiratory function stable Cardiovascular status: blood pressure returned to baseline, stable and tachycardic Postop Assessment: no apparent nausea or vomiting Anesthetic complications: no    Last Vitals:  Vitals:   01/03/20 1215 01/03/20 1240  BP: 133/68 (!) 147/88  Pulse: (!) 102 (!) 109  Resp: (!) 22 18  Temp: 37.2 C 37.2 C  SpO2: 93% 96%    Last Pain:  Vitals:   01/03/20 1240  TempSrc:   PainSc: 0-No pain                 Audry Pili

## 2020-01-03 NOTE — Transfer of Care (Signed)
Immediate Anesthesia Transfer of Care Note  Patient: ONNI BENNIS  Procedure(s) Performed: OPEN NECK BIOPSY (Left Neck)  Patient Location: PACU  Anesthesia Type:General  Level of Consciousness: awake, alert , oriented and patient cooperative  Airway & Oxygen Therapy: Patient Spontanous Breathing and Patient connected to face mask oxygen  Post-op Assessment: Report given to RN and Post -op Vital signs reviewed and stable  Post vital signs: Reviewed and stable  Last Vitals:  Vitals Value Taken Time  BP    Temp    Pulse 101 01/03/20 1140  Resp 26 01/03/20 1140  SpO2 95 % 01/03/20 1140  Vitals shown include unvalidated device data.  Last Pain:  Vitals:   01/03/20 0915  TempSrc: Tympanic  PainSc: 0-No pain         Complications: No apparent anesthesia complications

## 2020-01-03 NOTE — Anesthesia Preprocedure Evaluation (Addendum)
Anesthesia Evaluation  Patient identified by MRN, date of birth, ID band Patient awake    Reviewed: Allergy & Precautions, NPO status , Patient's Chart, lab work & pertinent test results  History of Anesthesia Complications Negative for: history of anesthetic complications  Airway Mallampati: II  TM Distance: >3 FB Neck ROM: Full    Dental  (+) Dental Advisory Given, Teeth Intact   Pulmonary neg pulmonary ROS,    Pulmonary exam normal        Cardiovascular hypertension, Pt. on medications Normal cardiovascular exam     Neuro/Psych PSYCHIATRIC DISORDERS negative neurological ROS     GI/Hepatic Neg liver ROS, GERD  Medicated and Controlled, Cyclic vomiting syndrome    Endo/Other  negative endocrine ROS Obesity   Renal/GU negative Renal ROS     Musculoskeletal negative musculoskeletal ROS (+)   Abdominal   Peds  (+) ATTENTION DEFICIT DISORDER WITHOUT HYPERACTIVITY Hematology negative hematology ROS (+)   Anesthesia Other Findings Covid neg 12/30/19   Reproductive/Obstetrics                            Anesthesia Physical Anesthesia Plan  ASA: II  Anesthesia Plan: General   Post-op Pain Management:    Induction: Intravenous  PONV Risk Score and Plan: 2 and Treatment may vary due to age or medical condition, Ondansetron, Dexamethasone and Midazolam  Airway Management Planned: Oral ETT  Additional Equipment: None  Intra-op Plan:   Post-operative Plan: Extubation in OR  Informed Consent: I have reviewed the patients History and Physical, chart, labs and discussed the procedure including the risks, benefits and alternatives for the proposed anesthesia with the patient or authorized representative who has indicated his/her understanding and acceptance.     Dental advisory given  Plan Discussed with: CRNA and Anesthesiologist  Anesthesia Plan Comments:        Anesthesia  Quick Evaluation

## 2020-01-03 NOTE — Anesthesia Procedure Notes (Signed)
Procedure Name: Intubation Date/Time: 01/03/2020 11:42 AM Performed by: Signe Colt, CRNA Pre-anesthesia Checklist: Patient identified, Emergency Drugs available, Suction available and Patient being monitored Patient Re-evaluated:Patient Re-evaluated prior to induction Oxygen Delivery Method: Circle system utilized Preoxygenation: Pre-oxygenation with 100% oxygen Induction Type: IV induction Ventilation: Mask ventilation without difficulty Laryngoscope Size: Miller and 2 Grade View: Grade I Tube type: Oral Number of attempts: 1 Airway Equipment and Method: Stylet and Oral airway Placement Confirmation: ETT inserted through vocal cords under direct vision,  positive ETCO2 and breath sounds checked- equal and bilateral Secured at: 21 cm Tube secured with: Tape Dental Injury: Teeth and Oropharynx as per pre-operative assessment

## 2020-01-03 NOTE — Op Note (Signed)
DATE OF PROCEDURE:  01/03/2020                              OPERATIVE REPORT  SURGEON:  Leta Baptist, MD  PREOPERATIVE DIAGNOSES: 1. Left neck mass.  POSTOPERATIVE DIAGNOSES: 1. Left neck mass.  PROCEDURE PERFORMED:  Open biopsy of left mass (CPT 38510).  ANESTHESIA:  General endotracheal tube anesthesia.  COMPLICATIONS:  None.  ESTIMATED BLOOD LOSS:  Minimal.  INDICATION FOR PROCEDURE:  Tyler [name] is a 24 y.o. male who first noted a left neck mass 2 weeks ago.  He was evaluated by Dr. Mickie Hillier.  He underwent a neck CT scan, which showed a large 6.6 adenopathy in his left lower neck.  In addition, he also has a 2.1 cm left Level III lymph node.  Multiple mediastinal adenopathy was also noted.  The findings were concerning for malignancy. Based on the above findings, the decision was made for the patient to undergo the open biopsy procedure. Likelihood of success in reducing symptoms was also discussed.  The risks, benefits, alternatives, and details of the procedure were discussed with the patient.  Questions were invited and answered.  Informed consent was obtained.  DESCRIPTION:  The patient was taken to the operating room and placed supine on the operating table.  General endotracheal tube anesthesia was administered by the anesthesiologist.  The patient was positioned and prepped and draped in a standard fashion for left neck surgery.  1% lidocaine with 1-100,000 epinephrine was infiltrated at the planned site of incision.  A lower neck transverse incision was made over the left supraclavicular area.  In the incision was carried down past the level of the platysma muscles.  Superiorly based and inferiorly based subplatysmal flaps were elevated.  The sternocleidomastoid muscles were then retracted, exposing the large supraclavicular lymph node.  Two incisional biopsy specimens were obtained.  The specimens were sent to the pathology department fresh for histologic identification.  The  surgical site was copiously irrigated.  Hemostasis was achieved with the Bovie electrocautery and Surgicel.  The incision was closed in layers with 4-0 Vicryl and Dermabond.   The care of the patient was turned over to the anesthesiologist.  The patient was awakened from anesthesia without difficulty.  The patient was extubated and transferred to the recovery room in good condition.  OPERATIVE FINDINGS:  A large left supraclavicular lymph node.  SPECIMEN: Left neck mass biopsy specimens.  FOLLOWUP CARE:  The patient will be discharged home once awake and alert.    The patient will follow up in my office in approximately 1 week.  Jenniferlynn Saad Cassie Freer 01/03/2020 11:37 AM

## 2020-01-03 NOTE — Discharge Instructions (Addendum)
The patient should avoid heavy lifting more than 20 pounds for 3 days.  He may resume all other activities and diet.  The patient will follow up in my office in 1 week.   Post Anesthesia Home Care Instructions  Activity: Get plenty of rest for the remainder of the day. A responsible individual must stay with you for 24 hours following the procedure.  For the next 24 hours, DO NOT: -Drive a car -Paediatric nurse -Drink alcoholic beverages -Take any medication unless instructed by your physician -Make any legal decisions or sign important papers.  Meals: Start with liquid foods such as gelatin or soup. Progress to regular foods as tolerated. Avoid greasy, spicy, heavy foods. If nausea and/or vomiting occur, drink only clear liquids until the nausea and/or vomiting subsides. Call your physician if vomiting continues.  Special Instructions/Symptoms: Your throat may feel dry or sore from the anesthesia or the breathing tube placed in your throat during surgery. If this causes discomfort, gargle with warm salt water. The discomfort should disappear within 24 hours.  If you had a scopolamine patch placed behind your ear for the management of post- operative nausea and/or vomiting:  1. The medication in the patch is effective for 72 hours, after which it should be removed.  Wrap patch in a tissue and discard in the trash. Wash hands thoroughly with soap and water. 2. You may remove the patch earlier than 72 hours if you experience unpleasant side effects which may include dry mouth, dizziness or visual disturbances. 3. Avoid touching the patch. Wash your hands with soap and water after contact with the patch.    Call your surgeon if you experience:   1.  Fever over 101.0. 2.  Inability to urinate. 3.  Nausea and/or vomiting. 4.  Extreme swelling or bruising at the surgical site. 5.  Continued bleeding from the incision. 6.  Increased pain, redness or drainage from the incision. 7.   Problems related to your pain medication. 8.  Any problems and/or concerns

## 2020-01-04 ENCOUNTER — Encounter: Payer: Self-pay | Admitting: *Deleted

## 2020-01-05 ENCOUNTER — Other Ambulatory Visit: Payer: Self-pay

## 2020-01-05 ENCOUNTER — Encounter (HOSPITAL_COMMUNITY)
Admission: RE | Admit: 2020-01-05 | Discharge: 2020-01-05 | Disposition: A | Discharge status: Home / Self Care | Payer: BC Managed Care – PPO | Source: Ambulatory Visit | Attending: Hematology | Admitting: Hematology

## 2020-01-05 DIAGNOSIS — R222 Localized swelling, mass and lump, trunk: Secondary | ICD-10-CM | POA: Insufficient documentation

## 2020-01-05 DIAGNOSIS — C8590 Non-Hodgkin lymphoma, unspecified, unspecified site: Secondary | ICD-10-CM | POA: Diagnosis not present

## 2020-01-05 LAB — SURGICAL PATHOLOGY

## 2020-01-05 LAB — GLUCOSE, CAPILLARY: Glucose-Capillary: 96 mg/dL (ref 70–99)

## 2020-01-05 MED ORDER — FLUDEOXYGLUCOSE F - 18 (FDG) INJECTION
12.4000 | Freq: Once | INTRAVENOUS | Status: AC | PRN
  Administered 2020-01-05: 12.4 via INTRAVENOUS

## 2020-01-06 ENCOUNTER — Inpatient Hospital Stay (HOSPITAL_COMMUNITY): Payer: BC Managed Care – PPO | Admitting: Hematology

## 2020-01-06 ENCOUNTER — Encounter (HOSPITAL_COMMUNITY): Payer: Self-pay | Admitting: Hematology

## 2020-01-06 VITALS — BP 145/90 | HR 101 | Temp 96.9°F | Resp 18 | Wt 252.5 lb

## 2020-01-06 DIAGNOSIS — Z20822 Contact with and (suspected) exposure to covid-19: Secondary | ICD-10-CM | POA: Diagnosis not present

## 2020-01-06 DIAGNOSIS — C8190 Hodgkin lymphoma, unspecified, unspecified site: Secondary | ICD-10-CM | POA: Insufficient documentation

## 2020-01-06 DIAGNOSIS — Z79899 Other long term (current) drug therapy: Secondary | ICD-10-CM | POA: Diagnosis not present

## 2020-01-06 DIAGNOSIS — I10 Essential (primary) hypertension: Secondary | ICD-10-CM | POA: Diagnosis not present

## 2020-01-06 DIAGNOSIS — C8118 Nodular sclerosis classical Hodgkin lymphoma, lymph nodes of multiple sites: Secondary | ICD-10-CM | POA: Diagnosis not present

## 2020-01-06 DIAGNOSIS — Z01812 Encounter for preprocedural laboratory examination: Secondary | ICD-10-CM

## 2020-01-06 DIAGNOSIS — Z801 Family history of malignant neoplasm of trachea, bronchus and lung: Secondary | ICD-10-CM | POA: Diagnosis not present

## 2020-01-06 DIAGNOSIS — M25551 Pain in right hip: Secondary | ICD-10-CM | POA: Diagnosis not present

## 2020-01-06 DIAGNOSIS — M545 Low back pain: Secondary | ICD-10-CM | POA: Diagnosis not present

## 2020-01-06 DIAGNOSIS — K5900 Constipation, unspecified: Secondary | ICD-10-CM | POA: Diagnosis not present

## 2020-01-06 DIAGNOSIS — R197 Diarrhea, unspecified: Secondary | ICD-10-CM | POA: Diagnosis not present

## 2020-01-06 DIAGNOSIS — Z5111 Encounter for antineoplastic chemotherapy: Secondary | ICD-10-CM | POA: Diagnosis not present

## 2020-01-06 DIAGNOSIS — R590 Localized enlarged lymph nodes: Secondary | ICD-10-CM | POA: Diagnosis not present

## 2020-01-06 DIAGNOSIS — Z803 Family history of malignant neoplasm of breast: Secondary | ICD-10-CM | POA: Diagnosis not present

## 2020-01-06 DIAGNOSIS — R05 Cough: Secondary | ICD-10-CM | POA: Diagnosis not present

## 2020-01-06 DIAGNOSIS — M25552 Pain in left hip: Secondary | ICD-10-CM | POA: Diagnosis not present

## 2020-01-06 MED ORDER — ALLOPURINOL 300 MG PO TABS: 300.0000 mg | ORAL_TABLET | Freq: Every day | ORAL | 1 refills | Status: DC

## 2020-01-06 NOTE — Progress Notes (Signed)
START ON PATHWAY REGIMEN - Lymphoma and CLL     A cycle is every 28 days:     Doxorubicin      Dacarbazine      Vinblastine      Bleomycin   **Always confirm dose/schedule in your pharmacy ordering system**  Patient Characteristics: Classical Hodgkin Lymphoma, First Line, Stage III / IV, Age < 37 Disease Type: Not Applicable Disease Type: Not Applicable Disease Type: Classical Hodgkin Lymphoma Line of therapy: First Line [place] Stage: IVAE Age: < 52 Intent of Therapy: Curative Intent, Discussed with Patient

## 2020-01-06 NOTE — Assessment & Plan Note (Signed)
1.  Stage IVAE classical Hodgkin's disease: -Biopsy of the left neck lymph node on 01/03/2020 shows classical Hodgkin lymphoma.  Suggestive of nodular sclerosis type.  EBV positive. -We reviewed the PET scan dated 01/05/2020 which showed hypermetabolic left cervical, supraclavicular, bilateral mediastinal adenopathy.  Hypermetabolic porta hepatic lymph nodes.  Mildly increased FDG uptake associated with the spleen which is above liver activity.  Diffusely increased uptake within the axial and proximal appendicular skeleton. -Patient does not have any B symptoms. -We reviewed his blood work.  LDH, ESR and leukocyte count is elevated.  Hepatitis B and C and HIV were negative. -Alk phos is also elevated, likely from bone involvement.  Hence did not recommend bone marrow biopsy. -IPS score of 3 (male, stage IV, leukocytosis). -He reports some itching of the skin.  He also reported left shoulder pain which is new.  Left-sided rib cage pain also present. -He is a non-smoker with normal lung function.  As his IPS score is less than 4, I did not recommend brentuximab vedotin plus AVD. -I have recommended standard treatment with ABVD followed by PET scan.  If PET2 has Deauville 3 or less response, we can consider eliminating bleomycin for the subsequent 4 cycles.  He does not have any bulky lymphadenopathy requiring subsequent ISRT. -I have recommended a baseline PFTs.  He is already scheduled for a baseline 2D echo next week. -We discussed all the side effects of ABVD in detail.  He will start his cycle 1 day 1 early next week. -Although ABVD is not associated with infertility, I have recommended fertility preservation in the event of refractory disease that may require other forms of treatment.  He tells me that he is not interested at this time. -He works at [employer] and would like to continue to work during treatment.  We have already made a referral to Dr. Lorenso Courier who will see him next Thursday and take  over the care. -We will also start him on allopurinol for TLS prophylaxis.

## 2020-01-06 NOTE — Progress Notes (Signed)
I met with patient and his aunt today during the visit with Dr. Delton Coombes.  I provided information for patient on his treatments, how to manage side effects at home. I advised him how to get in touch with [place] here at [facility] should he need assistance.  I talked with him about the flow of his first day here for treatment. I explained that there would be wait times.  I gave him and his aunt the opportunity to ask questions and all were answered to their satisfaction.

## 2020-01-06 NOTE — Progress Notes (Signed)
Tyler [name] [place],  [id number]   CLINIC:  Medical Oncology/Hematology  PCP:  Tyler [name], [place] [address] [phone]   REASON FOR VISIT:  Follow-up for Hodgkin's disease.  CURRENT THERAPY: ABVD.  BRIEF ONCOLOGIC HISTORY:  Oncology History  Hodgkin's disease ([place])  01/06/2020 Initial Diagnosis   Hodgkin's disease ([place])   01/10/2020 -  Chemotherapy   The patient had DOXOrubicin (ADRIAMYCIN) chemo injection 62 mg, 25 mg/m2, Intravenous,  Once, 0 of 2 cycles PALONOSETRON HCL INJECTION 0.25 MG/5ML, 0.25 mg, Intravenous,  Once, 0 of 2 cycles bleomycin (BLEOCIN) 25 Units in sodium chloride 0.9 % 50 mL chemo infusion, 10 Units/m2, Intravenous,  Once, 0 of 2 cycles dacarbazine (DTIC) 920 mg in sodium chloride 0.9 % 250 mL chemo infusion, 375 mg/m2, Intravenous,  Once, 0 of 2 cycles FOSAPREPITANT IV INFUSION 150 MG, 150 mg, Intravenous,  Once, 0 of 2 cycles vinBLAStine (VELBAN) 14.8 mg in sodium chloride 0.9 % 50 mL chemo infusion, 6 mg/m2, Intravenous, Once, 0 of 2 cycles  for chemotherapy treatment.       CANCER STAGING: Cancer Staging Hodgkin's disease [facility]) Staging form: Hodgkin and Non-Hodgkin Lymphoma, AJCC 8th Edition - Clinical stage from 01/06/2020: Stage IV (Hodgkin lymphoma, A - Asymptomatic) - Unsigned    INTERVAL HISTORY:  [name] 24 y.o. male seen for follow-up of left neck lymphadenopathy.  He had biopsy done by Dr. Benjamine [name] and had PET scan done.  He reports that he noticed left shoulder pain for the past few days since he has seen me.  Appetite and energy levels are 50%.  Also reports some mild left rib cage pain rated as 1 out of 10.  He is continuing to work.  Denies any fevers, night sweats or weight loss.  Reports some itching of the skin.    REVIEW OF SYSTEMS:  Review of Systems  Musculoskeletal:       Left shoulder pain for the past few days. -Left rib pain  All other systems  reviewed and are negative.    PAST MEDICAL/SURGICAL HISTORY:  Past Medical History:  Diagnosis Date  . ADD (attention deficit disorder)    Inattention type  . Cyclic vomiting syndrome 2009  . GERD (gastroesophageal reflux disease)   . Hypertension    Past Surgical History:  Procedure Laterality Date  . closed reduction rt. wrist    . IR IMAGING GUIDED PORT INSERTION  12/31/2019  . MASS BIOPSY Left 01/03/2020   Procedure: OPEN NECK BIOPSY;  Surgeon: Tyler Baptist, MD;  Location: [facility];  Service: ENT;  Laterality: Left;     SOCIAL HISTORY:  Social History   Socioeconomic History  . Marital status: Single    Spouse name: Not on file  . Number of children: 0  . Years of education: Not on file  . Highest education level: Not on file  Occupational History  . Not on file  Tobacco Use  . Smoking status: Never Smoker  . Smokeless tobacco: Former Network engineer and Sexual Activity  . Alcohol use: Never  . Drug use: Never  . Sexual activity: Not Currently  Other Topics Concern  . Not on file  Social History Narrative  . Not on file   Social Determinants of Health   Financial Resource Strain: Low Risk   . Difficulty of Paying Living Expenses: Not hard at all  Food Insecurity: No Food Insecurity  . Worried About  Running Out of Food in the Last Year: Never true  . Ran Out of Food in the Last Year: Never true  Transportation Needs: No Transportation Needs  . Lack of Transportation (Medical): No  . Lack of Transportation (Non-Medical): No  Physical Activity: Inactive  . Days of Exercise per Week: 0 days  . Minutes of Exercise per Session: 0 min  Stress: No Stress Concern Present  . Feeling of Stress : Not at all  Social Connections: Somewhat Isolated  . Frequency of Communication with Friends and Family: More than three times a week  . Frequency of Social Gatherings with Friends and Family: More than three times a week  . Attends Religious Services: More  than 4 times per year  . Active Member of Clubs or Organizations: No  . Attends Archivist Meetings: Never  . Marital Status: Never married  Intimate Partner Violence: Not At Risk  . Fear of Current or Ex-Partner: No  . Emotionally Abused: No  . Physically Abused: No  . Sexually Abused: No    FAMILY HISTORY:  Family History  Problem Relation Age of Onset  . Cancer Paternal Grandmother   . Hypertension Mother   . Hypertension Father     CURRENT MEDICATIONS:  Outpatient Encounter Medications as of 01/06/2020  Medication Sig  . acetaminophen (TYLENOL) 325 MG tablet Take 650 mg by mouth 2 (two) times daily as needed for moderate pain or fever.  Marland Kitchen amLODipine (NORVASC) 10 MG tablet Take 1 tablet (10 mg total) by mouth daily.  Marland Kitchen omeprazole (PRILOSEC OTC) 20 MG tablet Take 1 tablet (20 mg total) by mouth daily.  Marland Kitchen oxyCODONE-acetaminophen (PERCOCET) 5-325 MG tablet Take 1 tablet by mouth every 4 (four) hours as needed for up to 3 days for severe pain.  . sodium chloride (OCEAN) 0.65 % SOLN nasal spray Place 1 spray into both nostrils as needed for congestion.   No facility-administered encounter medications on file as of 01/06/2020.    ALLERGIES:  Allergies  Allergen Reactions  . Ace Inhibitors Swelling     PHYSICAL EXAM:  ECOG Performance status: 0  Vitals:   01/06/20 0900  BP: (!) 145/90  Pulse: (!) 101  Resp: 18  Temp: (!) 96.9 F (36.1 C)  SpO2: 97%   Filed Weights   01/06/20 0900  Weight: 252 lb 8 oz (114.5 kg)    Physical Exam Vitals reviewed.  Constitutional:      Appearance: Normal appearance.  Cardiovascular:     Rate and Rhythm: Normal rate and regular rhythm.     Heart sounds: Normal heart sounds.  Pulmonary:     Effort: Pulmonary effort is normal.     Breath sounds: Normal breath sounds.  Abdominal:     General: There is no distension.     Palpations: Abdomen is soft. There is no mass.  Lymphadenopathy:     Cervical: Cervical adenopathy  present.  Skin:    General: Skin is warm.  Neurological:     General: No focal deficit present.     Mental Status: He is alert and oriented to person, place, and time.  Psychiatric:        Mood and Affect: Mood normal.        Behavior: Behavior normal.      LABORATORY DATA:  I have reviewed the labs as listed.  CBC    Component Value Date/Time   WBC 17.6 (H) 12/24/2019 1556   RBC 4.61 12/24/2019 1556   HGB 12.9 (  L) 12/24/2019 1556   HCT 38.8 12/24/2019 1556   PLT 400 12/24/2019 1556   MCV 84 12/24/2019 1556   MCH 28.0 12/24/2019 1556   MCHC 33.2 12/24/2019 1556   RDW 12.9 12/24/2019 1556   LYMPHSABS 2.0 12/24/2019 1556   EOSABS 0.1 12/24/2019 1556   BASOSABS 0.1 12/24/2019 1556   CMP Latest Ref Rng & Units 12/24/2019 08/02/2017  Glucose 65 - 99 mg/dL 97 87  BUN 6 - 20 mg/dL 13 14  Creatinine 0.76 - 1.27 mg/dL 0.98 1.06  Sodium 134 - 144 mmol/L 140 141  Potassium 3.5 - 5.2 mmol/L 4.7 4.5  Chloride 96 - 106 mmol/L 100 99  CO2 20 - 29 mmol/L 25 25  Calcium 8.7 - 10.2 mg/dL 9.3 9.5  Total Protein 6.0 - 8.5 g/dL 7.3 7.1  Total Bilirubin 0.0 - 1.2 mg/dL 0.3 0.3  Alkaline Phos 39 - 117 IU/L 126(H) 76  AST 0 - 40 IU/L 9 10  ALT 0 - 44 IU/L 16 31       DIAGNOSTIC IMAGING:  I have independently reviewed the scans and discussed with the patient.     ASSESSMENT & PLAN:   Hodgkin's disease ([place]) 1.  Stage IVAE classical Hodgkin's disease: -Biopsy of the left neck lymph node on 01/03/2020 shows classical Hodgkin lymphoma.  Suggestive of nodular sclerosis type.  EBV positive. -We reviewed the PET scan dated 01/05/2020 which showed hypermetabolic left cervical, supraclavicular, bilateral mediastinal adenopathy.  Hypermetabolic porta hepatic lymph nodes.  Mildly increased FDG uptake associated with the spleen which is above liver activity.  Diffusely increased uptake within the axial and proximal appendicular skeleton. -Patient does not have any B symptoms. -We reviewed his  blood work.  LDH, ESR and leukocyte count is elevated.  Hepatitis B and C and HIV were negative. -Alk phos is also elevated, likely from bone involvement.  Hence did not recommend bone marrow biopsy. -IPS score of 3 (male, stage IV, leukocytosis). -He reports some itching of the skin.  He also reported left shoulder pain which is new.  Left-sided rib cage pain also present. -He is a non-smoker with normal lung function.  As his IPS score is less than 4, I did not recommend brentuximab vedotin plus AVD. -I have recommended standard treatment with ABVD followed by PET scan.  If PET2 has Deauville 3 or less response, we can consider eliminating bleomycin for the subsequent 4 cycles.  He does not have any bulky lymphadenopathy requiring subsequent ISRT. -I have recommended a baseline PFTs.  He is already scheduled for a baseline 2D echo next week. -We discussed all the side effects of ABVD in detail.  He will start his cycle 1 day 1 early next week. -Although ABVD is not associated with infertility, I have recommended fertility preservation in the event of refractory disease that may require other forms of treatment.  He tells me that he is not interested at this time. -He works at [employer] and would like to continue to work during treatment.  We have already made a referral to Dr. Lorenso Courier who will see him next Thursday and take over the care. -We will also start him on allopurinol for TLS prophylaxis.         Orders placed this encounter:  Orders Placed This Encounter  Procedures  . Novel Coronavirus, NAA ([facility])  . CBC with Differential/Platelet  . Comprehensive metabolic panel  . Pulmonary Function Test      Tyler Jack, MD [facility] [phone]

## 2020-01-06 NOTE — Patient Instructions (Addendum)
[place] at [facility] Discharge Instructions  You were seen today by Dr. Delton Coombes. He went over your recent lab and scan results. He discussed your possible treatment and what medications you will be given. He will schedule you for a pulmonary function test here at [facility].  Thank you for choosing [name] at [facility] to provide your oncology and hematology care.  To afford each patient quality time with our provider, please arrive at least 15 minutes before your scheduled appointment time.   If you have a lab appointment with the [facility] please come in thru the  Main Entrance and check in at the main information desk  You need to re-schedule your appointment should you arrive 10 or more minutes late.  We strive to give you quality time with our providers, and arriving late affects you and other patients whose appointments are after yours.  Also, if you no show three or more times for appointments you may be dismissed from the clinic at the providers discretion.     Again, thank you for choosing [facility].  Our hope is that these requests will decrease the amount of time that you wait before being seen by our physicians.       _____________________________________________________________  Should you have questions after your visit to [facility], please contact our office at [phone] between the hours of 8:00 a.m. and 4:30 p.m.  Voicemails left after 4:00 p.m. will not be returned until the following business day.  For prescription refill requests, have your pharmacy contact our office and allow 72 hours.    Cancer Center Support Programs:   > Cancer Support Group  2nd Tuesday of the month 1pm-2pm, Journey Room

## 2020-01-07 ENCOUNTER — Other Ambulatory Visit (HOSPITAL_COMMUNITY)
Admission: RE | Admit: 2020-01-07 | Discharge: 2020-01-07 | Disposition: A | Discharge status: Home / Self Care | Payer: BC Managed Care – PPO | Source: Ambulatory Visit | Attending: Hematology | Admitting: Hematology

## 2020-01-07 ENCOUNTER — Other Ambulatory Visit (HOSPITAL_COMMUNITY): Payer: Self-pay | Admitting: Nurse Practitioner

## 2020-01-07 DIAGNOSIS — Z20822 Contact with and (suspected) exposure to covid-19: Secondary | ICD-10-CM | POA: Insufficient documentation

## 2020-01-07 DIAGNOSIS — Z01812 Encounter for preprocedural laboratory examination: Secondary | ICD-10-CM | POA: Diagnosis not present

## 2020-01-08 LAB — SARS CORONAVIRUS 2 (TAT 6-24 HRS): SARS Coronavirus 2: NEGATIVE

## 2020-01-10 ENCOUNTER — Ambulatory Visit (HOSPITAL_COMMUNITY)
Admission: RE | Admit: 2020-01-10 | Discharge: 2020-01-10 | Disposition: A | Discharge status: Home / Self Care | Payer: BC Managed Care – PPO | Source: Ambulatory Visit | Attending: Hematology | Admitting: Hematology

## 2020-01-10 ENCOUNTER — Other Ambulatory Visit: Payer: Self-pay

## 2020-01-10 DIAGNOSIS — Z0189 Encounter for other specified special examinations: Secondary | ICD-10-CM | POA: Diagnosis not present

## 2020-01-10 DIAGNOSIS — R222 Localized swelling, mass and lump, trunk: Secondary | ICD-10-CM | POA: Insufficient documentation

## 2020-01-10 DIAGNOSIS — C8118 Nodular sclerosis classical Hodgkin lymphoma, lymph nodes of multiple sites: Secondary | ICD-10-CM | POA: Diagnosis not present

## 2020-01-10 LAB — PULMONARY FUNCTION TEST
DL/VA % pred: 96 %
DL/VA: 4.84 ml/min/mmHg/L
DLCO cor % pred: 80 %
DLCO cor: 30.6 ml/min/mmHg
DLCO unc % pred: 75 %
DLCO unc: 29.03 ml/min/mmHg
FEF 25-75 Post: 4.21 L/sec
FEF 25-75 Pre: 3.74 L/sec
FEF2575-%Change-Post: 12 %
FEF2575-%Pred-Post: 79 %
FEF2575-%Pred-Pre: 70 %
FEV1-%Change-Post: 1 %
FEV1-%Pred-Post: 80 %
FEV1-%Pred-Pre: 78 %
FEV1-Post: 4.25 L
FEV1-Pre: 4.17 L
FEV1FVC-%Change-Post: 6 %
FEV1FVC-%Pred-Pre: 94 %
FEV6-%Change-Post: -4 %
FEV6-%Pred-Post: 79 %
FEV6-%Pred-Pre: 82 %
FEV6-Post: 5.08 L
FEV6-Pre: 5.3 L
FEV6FVC-%Pred-Post: 100 %
FEV6FVC-%Pred-Pre: 100 %
FVC-%Change-Post: -4 %
FVC-%Pred-Post: 78 %
FVC-%Pred-Pre: 82 %
FVC-Post: 5.08 L
FVC-Pre: 5.3 L
Post FEV1/FVC ratio: 84 %
Post FEV6/FVC ratio: 100 %
Pre FEV1/FVC ratio: 79 %
Pre FEV6/FVC Ratio: 100 %
RV % pred: 82 %
RV: 1.45 L
TLC % pred: 89 %
TLC: 7.06 L

## 2020-01-10 MED ORDER — ALBUTEROL SULFATE (2.5 MG/3ML) 0.083% IN NEBU
2.5000 mg | INHALATION_SOLUTION | Freq: Once | RESPIRATORY_TRACT | Status: AC
  Administered 2020-01-10: 2.5 mg via RESPIRATORY_TRACT

## 2020-01-10 MED ORDER — PROCHLORPERAZINE MALEATE 10 MG PO TABS: 10.0000 mg | ORAL_TABLET | Freq: Four times a day (QID) | ORAL | 1 refills | Status: DC | PRN

## 2020-01-10 MED ORDER — LIDOCAINE-PRILOCAINE 2.5-2.5 % EX CREA: TOPICAL_CREAM | CUTANEOUS | 3 refills | Status: DC

## 2020-01-10 NOTE — Progress Notes (Signed)
*  PRELIMINARY RESULTS* Echocardiogram 2D Echocardiogram has been performed.  Leavy Cella 01/10/2020, 10:16 AM

## 2020-01-11 ENCOUNTER — Inpatient Hospital Stay (HOSPITAL_COMMUNITY): Payer: BC Managed Care – PPO | Admitting: General Practice

## 2020-01-11 ENCOUNTER — Encounter: Payer: Self-pay | Admitting: General Practice

## 2020-01-11 NOTE — Patient Instructions (Addendum)
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  You will be treated every other week x 6 cycles with a combination of chemotherapy drugs - doxorubicin (Adriamycin), bleomycin, vinblastine, and dacarbazine. The intent of treatment is to cure your disease.  You will see the doctor regularly throughout treatment.  We will obtain blood work from you prior to every treatment and monitor your results to make sure it is safe to give your treatment. The doctor monitors your response to treatment by the way you are feeling, your blood work, and by obtaining scans periodically. There will be wait times while you are here for treatment.  It will take about 30 minutes to 1 hour for your lab work to result.  Then there will be wait times while pharmacy mixes your medications.    Medications you will receive in the clinic prior to your chemotherapy medications:  Aloxi:  ALOXI is used in adults to help prevent nausea and vomiting that happens with certain chemotherapy drugs.  Aloxi is a long acting medication, and will remain in your system for about two days.   Emend:  This is an anti-nausea medication that is used with Aloxi to help prevent nausea and vomiting caused by chemotherapy.  Dexamethasone:  This is a steroid given prior to chemotherapy to help prevent allergic reactions; it may also help prevent and control nausea and diarrhea.    Doxorubicin (Adriamycin)  About This Drug Doxorubicin is used to treat cancer. It is given in the vein (IV).  It will take 10-15 minutes to infuse.   Possible Side Effects  . Bone marrow suppression. This is a decrease in the number of white blood cells, red blood cells, and platelets. This may raise your risk of infection, make you tired and weak (fatigue), and raise your risk of bleeding.  . Fever in the setting of decreased white blood cells, which is a serious condition that can be  life-threatening  . Blurred vision or other changes in eyesight  . Nausea and vomiting (throwing up)  . Diarrhea (loose bowel movements)  . Decreased appetite (decreased hunger)  . Soreness of the mouth and throat. You may have red areas, white patches, or sores that hurt.  . Pain in your abdomen  . Tiredness and weakness  . General discomfort, a feeling of being unwell  . Allergic reactions, including anaphylaxis are rare but may happen in some patients. Signs of allergic reaction to this drug may be swelling of the face, feeling like your tongue or throat are swelling, trouble breathing, rash, itching, fever, chills, feeling dizzy, and/or feeling that your heart is beating in a fast or not normal way. If this happens, do not take another dose of this drug. You should get urgent medical treatment.  . In women, menstrual bleeding may become irregular or stop while you are getting this drug. Do not assume that you cannot become pregnant if you do not have a menstrual period.  . Women may go through signs of menopause (change of life) like vaginal dryness or itching.  . Hair loss. Hair loss is often temporary, although with certain medicine, hair loss can sometimes be permanent. Hair loss may happen suddenly or gradually. If you lose hair, you may lose it from your head, face, armpits, pubic area, chest, and/or legs. You may also notice your hair getting thin.  . Changes in your nail color, nail loss and/or brittle nail  . If  you have received radiation treatments, your skin may become red after doxorubicin. This reaction is called "radiation recall." Your body is recalling, or remembering, that it had radiation therapy.  Note: Not all possible side effects are included above.  Warnings and Precautions  . Skin and tissue irritation including redness, pain, warmth, or swelling at the IV site if the drug leaks out of the vein and into nearby tissue. Very rarely it may cause tissue  necrosis (death).  . Changes in the tissue of the heart and/or congestive heart failure. You may be short of breath. Your arms, hands, legs and feet may swell.  . This drug may raise your risk of getting a second cancer, such as leukemia and myelodysplastic syndrome  . Severe bone marrow suppression  Note: Some of the side effects above are very rare. If you have concerns and/or questions, please discuss them with your medical team.  Important Information  . This drug may be present in the saliva, tears, sweat, urine, stool, vomit, semen, and vaginal secretions. Talk to your doctor and/or your nurse about the necessary precautions to take during this time.  . Talk to your doctor before receiving any vaccinations during your treatment. Some vaccinations are not recommended while receiving doxorubicin.  . Urine color may be slightly orange or reddish starting several hours after you get this drug. This will slowly go away within one to two days.  Treating Side Effects  . Manage tiredness by pacing your activities for the day.  . Be sure to include periods of rest between energy-draining activities.  . To decrease the risk of infection, wash your hands regularly.  . Avoid close contact with people who have a cold, the flu, or other infections.  . Take your temperature as your doctor or nurse tells you, and whenever you feel like you may have a fever.  . To help decrease the risk of bleeding, use a soft toothbrush. Check with your nurse before using dental floss.  . Be very careful when using knives or tools.  . Use an electric shaver instead of a razor.  . Drink plenty of fluids (a minimum of eight glasses per day is recommended).  . If you throw up or have loose bowel movements, you should drink more fluids so that you do not become dehydrated (lack of water in the body from losing too much fluid).  . If you have diarrhea, eat low-fiber foods that are high in protein and  calories and avoid foods that can irritate your digestive tracts or lead to cramping.  . Ask your nurse or doctor about medicine that can lessen or stop your diarrhea.  . To help with nausea and vomiting, eat small, frequent meals instead of three large meals a day. Choose foods and drinks that are at room temperature. Ask your nurse or doctor about other helpful tips and medicine that is available to help stop or lessen these symptoms.  . Mouth care is very important. Your mouth care should consist of routine, gentle cleaning of your teeth or dentures and rinsing your mouth with a mixture of 1/2 teaspoon of salt in 8 ounces of water or 1/2 teaspoon of baking soda in 8 ounces of water. This should be done at least after each meal and at bedtime.  . If you have mouth sores, avoid mouthwash that has alcohol. Also avoid alcohol and smoking because they can bother your mouth and throat.  . To help with decreased appetite, eat small,  frequent meals.  . Eat foods high in calories and protein, such as meat, poultry, fish, dry beans, tofu, eggs, nuts, milk, yogurt, cheese, ice cream, pudding, and nutritional supplements.  . Consider using sauces and spices to increase taste. Daily exercise, with your doctor's approval, may increase your appetite.  Marland Kitchen Keeping your pain under control is important to your well-being. Please tell your doctor or nurse if you are experiencing pain.  . To help with possible signs of early menopause, vaginal lubricants can be used to lessen vaginal dryness, itching, and pain during sexual relations.  . To help with hair loss, wash with a mild shampoo and avoid washing your hair every day.  . Avoid rubbing your scalp, pat your hair or scalp dry.  . Avoid coloring your hair.  . Limit your use of hair spray, electric curlers, blow dryers, and curling irons.  . If you are interested in getting a wig, talk to your nurse. You can also call the [employer] at  800-ACS-2345 to find out information about the "Look Good, Feel Better" program close to where you live. It is a free program where women getting chemotherapy can learn about wigs, turbans and scarves as well as makeup techniques and skin and nail care.  Marland Kitchen Keeping your nails moisturized may help with brittleness.  . If you received radiation, and your skin becomes red or irritated again, follow the same care instructions you did during radiation treatment. Be sure to tell the nurse or doctor administering your chemotherapy about your skin changes.  Food and Drug Interactions  . There are no known interactions of doxorubicin with food.  . Check with your doctor or pharmacist about all other prescription medicines and over-the-counter medicines and dietary supplements (vitamins, minerals, herbs and others) you are taking before starting this medicine as there are known drug interactions with doxorubicin. Also, check with your doctor or pharmacist before starting any new prescription or over-the-counter medicines, or dietary supplements to make sure that there are no interactions.  When to Call the Doctor  Call your doctor or nurse if you have any of these symptoms and/or any new or unusual symptoms:  . Fever of 100.4 F (38 C) or higher  . Chills  . Blurred vision or other changes in eyesight  . Feeling dizzy or lightheaded  . Trouble breathing  . Feeling that your heart is beating fast or in a not normal way (palpitations)  . Tiredness that interferes with your daily activities  . Easy bleeding or bruising  . Loose bowel movements (diarrhea) 4 times a day or loose bowel movements with lack of strength or a feeling of being dizzy  . Pain in your mouth or throat that makes it hard to eat or drink  . Pain in your abdomen that does not go away  . Nausea that stops you from eating or drinking and/or is not relieved by prescribed medicines  . Throwing up  . Lasting loss of  appetite or rapid weight loss of five pounds in a week  . Swelling of legs, ankles, or feet  . Signs of inflammation/infection (redness, swelling, pain) of the tissue around your nails.  . Weight gain of 5 pounds in one week (fluid retention)  . Signs of allergic reaction: swelling of the face, feeling like your tongue or throat are swelling, trouble breathing, rash, itching, fever, chills, feeling dizzy, and/or feeling that your heart is beating in a fast or not normal way. If this  happens, call 911 for emergency care.   . While you are getting this drug, please tell your nurse right away if you have any pain, redness, or swelling at the site of the IV infusion  . If you think you may be pregnant or may have impregnated your partner  Reproduction Warnings  . Pregnancy warning: This drug can have harmful effects on the unborn baby. Women of childbearing potential and men with male partners of childbearing potential should use effective methods of birth control during your cancer treatment. Let your doctor know right away if you think you may be pregnant or may have impregnated your partner  . Breastfeeding warning: Women should not breastfeed during treatment because this drug could enter the breast milk and cause harm to a breastfeeding baby. Women should talk to their doctor about the risks and benefits of breastfeeding during treatment with this drug.  . Fertility warning: In men and women both, this drug may affect your ability to have children in the future. Talk with your doctor or nurse if you plan to have children. Ask for information on sperm or egg banking.  Bleomycin (Blenoxane)  About This Drug  Bleomycin is used to treat cancer. It is given in the vein (IV) via infusion. It will take 10 minutes to infuse.   Possible Side Effects  . Redness  . Rash, sometimes with small fluid-filled bumps/blisters  . Itching  . Stretch marks  . Thickening of the skin  . Darkening  of the skin, or changes to the color of your skin  . Skin tenderness  . Changes in your nail color, nail loss and/or brittle nail  . Hair loss. Hair loss is often temporary, although with certain medicine, hair loss can sometimes be permanent. Hair loss may happen suddenly or gradually. If you lose hair, you may lose it from your head, face, armpits, pubic area, chest, and/or legs. You may also notice your hair getting thin.  . Soreness of the mouth and throat. You may have red areas, white patches, or sores that hurt.  Note: Each of the side effects above was reported in 50% or greater of patients treated with bleomycin. Not all possible side effects are included above.  Warnings and Precautions  . Thickening and/or inflammation (swelling) of the lung tissues which can be life-threatening. You may have a dry cough or trouble breathing.  . Allergic reactions, including anaphylaxis are rare but may happen in some patients. Signs of allergic reaction to this drug may be swelling of the face, feeling like your tongue or throat are swelling, trouble breathing, rash, itching, fever, chills, feeling dizzy, and/or feeling that your heart is beating in a fast or not normal way. If this happens, do not take another dose of this drug. You should get urgent medical treatment.  Marland Kitchen Heart attack, stroke or other blood vessel problem may rarely occur. Symptoms of a stroke such as sudden numbness or weakness of your face, arm, or leg, especially on one side of your body; sudden confusion, trouble speaking or understanding; sudden trouble seeing in one or both eyes; sudden trouble walking, feeling dizzy, loss of balance or coordination; or sudden bad headache with no known cause. If you have any of these symptoms for 2 minutes, call 911. . Changes in your kidney function . Changes in your liver function, which can cause liver failure  Important Information . This drug may be present in the saliva, tears, sweat,  urine, stool, vomit, semen, and  vaginal secretions. Talk to your doctor and/or your nurse about the necessary precautions to take during this time.  Treating Side Effects  . Drink plenty of fluids (a minimum of eight glasses per day is recommended).  . Mouth care is very important. Your mouth care should consist of routine, gentle cleaning of your teeth or dentures and rinsing your mouth with a mixture of 1/2 teaspoon of salt in 8 ounces of water or 1/2 teaspoon of baking soda in 8 ounces of water. This should be done at least after each meal and at bedtime.  . If you have mouth sores, avoid mouthwash that has alcohol. Also avoid alcohol and smoking because they can bother your mouth and throat.  . To help with hair loss, wash your hair with a mild shampoo and avoid washing your hair every day.  . Avoid rubbing your scalp, pat your hair or scalp dry.  . Avoid coloring your hair.  . Limit your use of hair spray, electric curlers, blow dryers, and curling irons.  . If you are interested in getting a wig, talk to your nurse. You can also call the [employer] at 800-ACS-2345 to find out information about the "Look Good, Feel Better" program close to where you live. It is a free program where women getting chemotherapy can learn about wigs, turbans and scarves as well as makeup techniques and skin and nail care.  . Moisturize your skin several times day.   . Avoid sun exposure and apply sunscreen routinely when outside.  Marland Kitchen Keeping your nails moisturized may help with brittleness.  . If you get a rash do not put anything on it unless your doctor or nurse says you may. Keep the area around the rash clean and dry. Ask your doctor for medicine if your rash bothers you.  Food and Drug Interactions  . There are no known interactions of bleomycin with food.  . Check with your doctor or pharmacist about all other prescription medicines and dietary supplements you are taking before  starting this medicine as there are known drug interactions with bleomycin. Also, check with your doctor or pharmacist before starting any new prescription or over-the-counter medicines, or dietary supplement to make sure that there are no interactions.  When to Call the Doctor  Call your doctor or nurse if you have any of these symptoms and/or any new or unusual symptoms:  . Fever of 100.4 F (38 C) or higher  . Chills  . Pain in your chest  . Wheezing or trouble breathing  . Dry cough  . Confusion  . Pain in your mouth or throat that makes it hard to eat or drink  . Signs of possible liver problems: dark urine, pale bowel movements, bad stomach pain, feeling very tired and weak, unusual itching, or yellowing of the eyes or skin  . Decreased urine or very dark urine  . A new rash or a rash that is not relieved by prescribed medicines  . Signs of inflammation/infection (redness, swelling, pain) of the tissue around your nails.  . Signs of allergic reaction: swelling of the face, feeling like your tongue or throat are swelling, trouble breathing, rash, itching, fever, chills, feeling dizzy, and/or feeling that your heart is beating in a fast or not normal way. If this happens, call 911 for emergency care.  . Chest pain or symptoms of a heart attack. Most heart attacks involve pain in the center of the chest that lasts more than a  few minutes. The pain may go away and come back or it can be constant. It can feel like pressure, squeezing, fullness, or pain. Sometimes pain is felt in one or both arms, the back, neck, jaw, or stomach. If any of these symptoms last 2 minutes, call 911.  Marland Kitchen Symptoms of a stroke such as sudden numbness or weakness of your face, arm, or leg, especially on one side of your body; sudden confusion, trouble speaking or understanding; sudden trouble seeing in one or both eyes; sudden trouble walking, feeling dizzy, loss of balance or coordination; or sudden bad  headache with no known cause. If you have any of these symptoms for 2 minutes, call 911.  . If you think you may be pregnant  Reproduction Warnings  . Pregnancy warning: This drug can have harmful effects on the unborn baby. Women of child bearing potential should use effective methods of birth control during your cancer treatment. Let your doctor know right away if you think you may be pregnant. . Breastfeeding warning: It is not known if this drug passes into breast milk, for this reason, women should not breastfeed during treatment because this drug could enter the breast milk and cause harm to a breastfeeding baby.  . Fertility warning: Human fertility studies have not been done with this drug. Talk to your doctor or nurse if you plan to have children. Ask for information on sperm or egg banking.  Vinblastine (Velban)  About This Drug Vinblastine is used to treat cancer. It is given in the vein (IV).  This drug will take 10 minutes to infuse.   Possible Side Effects  . Bone marrow suppression. This is a decrease in the number of white blood cells, red blood cells, and platelets. This may raise your risk of infection, make you tired and weak (fatigue), and raise your risk of bleeding.  . Changes to your heart function including chest pain, stroke, abnormal heart beats, and heart attack may rarely occur. Symptoms of a stroke such as sudden numbness or weakness of your face, arm, or leg, especially on one side of your body; sudden confusion, trouble speaking or understanding; sudden trouble seeing in one or both eyes; sudden trouble walking, feeling dizzy, loss of balance or coordination; or sudden bad headache with no known cause. If you have any of these symptoms for 2 minutes, call 911.   . Nausea and vomiting (throwing up)  . Constipation  . General discomfort, a feeling of being unwell  . Jaw pain  . Bone and/or tumor pain  . Effects on the nerves are called peripheral  neuropathy. You may feel numbness, tingling, or pain in your hands and feet. It may be hard for you to button your clothes, open jars, or walk as usual. The effect on the nerves may get worse with more doses of the drug. These effects get better in some people after the drug is stopped but it does not get better in all people. . Hair loss. Hair loss is often temporary, although with certain medicine, hair loss can sometimes be permanent. Hair loss may happen suddenly or gradually. If you lose hair, you may lose it from your head, face, armpits, pubic area, chest, and/or legs. You may also notice your hair getting thin.  . High blood pressure  Note: Not all possible side effects are included above.  Warnings and Precautions . Skin and tissue irritation including redness, pain, warmth, or swelling at the IV site if the drug leaks  out of the vein and into nearby tissue. Very rarely, it may cause tissue necrosis (death).   . Soreness of the mouth and throat. You may have red areas, white patches, or sores that hurt. . Severe bone marrow suppression. White blood cells are most commonly affected but a decrease in red blood cells and platelets may also rarely occur.  . Allergic reactions, including anaphylaxis are rare but may happen in some patients. Signs of allergic reaction to this drug may be swelling of the face, feeling like your tongue or throat are swelling, trouble breathing, rash, itching, fever, chills, feeling dizzy, and/or feeling that your heart is beating in a fast or not normal way. If this happens, do not take another dose of this drug. You should get urgent medical treatment.  . Trouble breathing, or severe spasm of airway may occur following infusion of vinblastine usually in combination with another drug called mitomycin.  . Changes in your central nervous system can happen. The central nervous system is made up of your brain and spinal cord. You could feel extreme tiredness, agitation,  confusion, hallucinations (see or hear things that are not there), have trouble understanding or speaking, loss of control of your bowels or bladder, eyesight changes, numbness or lack of strength to your arms, legs, face, or body, seizures or coma. If you start to have any of these symptoms let your doctor know right away.  Note: Some of the side effects above are very rare. If you have concerns and/or questions, please discuss them with your medical team.  Important Information  . This drug may be present in the saliva, tears, sweat, urine, stool, vomit, semen, and vaginal secretions. Talk to your doctor and/or your nurse about the necessary precautions to take during this time.  Treating Side Effects  . To decrease the risk of infection, wash your hands regularly.  . Avoid close contact with people who have a cold, the flu, or other infections.  . Take your temperature as your doctor or nurse tells you, and whenever you feel like you may have a fever.  . Manage tiredness by pacing your activities for the day. Be sure to include periods of rest between energy-draining activities.  . To help decrease the risk of bleeding, use a soft toothbrush. Check with your nurse before using dental floss.  . Be very careful when using knives or tools.  . Use an electric shaver instead of a razor.  . Drink plenty of fluids (a minimum of eight glasses per day is recommended).  . If you throw up or have loose bowel movements, you should drink more fluids so that you do not become dehydrated (lack of water in the body from losing too much fluid). . To help with nausea and vomiting, eat small, frequent meals instead of three large meals a day. Choose foods and drinks that are at room temperature. Ask your nurse or doctor about other helpful tips and medicine that is available to help stop or lessen these symptoms.  . Mouth care is very important. Your mouth care should consist of routine, gentle cleaning  of your teeth or dentures and rinsing your mouth with a mixture of 1/2 teaspoon of salt in 8 ounces of water or 1/2 teaspoon of baking soda in 8 ounces of water. This should be done at least after each meal and at bedtime.  . If you have mouth sores, avoid mouthwash that has alcohol. Also avoid alcohol and smoking because they can  bother your mouth and throat.   . Ask your doctor or nurse about medicines that are available to help stop or lessen constipation.  . If you are not able to move your bowels, check with your doctor or nurse before you use enemas, laxatives, or suppositories.  . If you have numbness and tingling in your hands and feet, be careful when cooking, walking, and handling sharp objects and hot liquids.  . To help with hair loss, wash with a mild shampoo and avoid washing your hair every day.  . Avoid rubbing your scalp, pat your hair or scalp dry.  . Avoid coloring your hair.  . Limit your use of hair spray, electric curlers, blow dryers, and curling irons.  . If you are interested in getting a wig, talk to your nurse. You can also call the [facility] at [phone] to find out information about the "Look Good, Feel Better" program close to where you live. It is a free program where women getting chemotherapy can learn about wigs, turbans and scarves as well as makeup techniques and skin and nail care.  Marland Kitchen Keeping your pain under control is important to your well-being. Please tell your doctor or nurse if you are experiencing pain.  Food and Drug Interactions  . There are no known interactions of vinblastine with food.  . Check with your doctor or pharmacist about all other prescription medicines and dietary supplements you are taking before starting this medicine as there are known drug interactions with vincristine. Also, check with your doctor or pharmacist before starting any new prescription or over-the-counter medicines, or dietary supplement to make  sure that there are no interactions.  When to Call the Doctor  Call your doctor or nurse if you have any of these symptoms and/or any new or unusual symptoms:  . Fever of 100.4 F (38 C) or higher  . Chills  . Tiredness that interferes with your daily activities  . Feeling dizzy or lightheaded  . Easy bleeding or bruising  . Confusion and/or agitation  . Seizures  . Hallucinations  . Trouble understanding or speaking  . Blurry vision or changes in your eyesight  . Numbness or lack of strength to your arms, legs, face, or body  . Trouble breathing  . Headache that does not go away  . Feeling that your heart is beating in a fast or not normal way (palpitations) Chest pain or symptoms of a heart attack. Most heart attacks involve pain in the center of the chest that lasts more than a few minutes. The pain may go away and come back, or it can be constant. It can feel like pressure, squeezing, fullness, or pain. Sometimes pain is felt in one or both arms, the back, neck, jaw, or stomach. If any of these symptoms last 2 minutes, call 911.  Marland Kitchen Symptoms of a stroke such as sudden numbness or weakness of your face, arm, or leg, especially on one side of your body; sudden confusion, trouble speaking or understanding; sudden trouble seeing in one or both eyes; sudden trouble walking, feeling dizzy, loss of balance or coordination; or sudden bad headache with no known cause. If you have any of these symptoms for 2 minutes, call 911.  . No bowel movement in 3 days or when you feel uncomfortable  . Pain in your mouth or throat that makes it hard to eat or drink  . Nausea that stops you from eating or drinking and/or is not relieved  by prescribed medicines  . Throwing up  . Pain that does not go away  . While you are getting this drug, please tell your nurse right away if you have any pain, redness, or swelling at the site of the IV infusion  . Numbness, tingling, or pain in your  hands and feet  . Signs of allergic reaction: swelling of the face, feeling like your tongue or throat are swelling, trouble breathing, rash, itching, fever, chills, feeling dizzy, and/or feeling that your heart is beating in a fast or not normal way  . If you think you may be pregnant  Reproduction Warnings  . Pregnancy warning: This drug can have harmful effects on the unborn baby. Women of child bearing potential should use effective methods of birth control during your cancer treatment. Let your doctor know right away if you think you may be pregnant  . Breastfeeding warning: It is not known if this drug passes into breast milk. For this reason, women should talk to their doctor about the risks and benefits of breastfeeding during treatment with this drug because this drug may enter the breast milk and cause harm to a breastfeeding baby.  . Fertility warning: Human fertility studies have not been done with this drug. Talk with your doctor or nurse if you plan to have children. Ask for information on sperm or egg banking.   Dacarbazine (DTIC-Dome)  About This Drug Dacarbazine is used to treat cancer. It is given in the vein (IV).  This drug will take 1 hour to infuse.   Possible Side Effects  . Nausea and throwing up (vomiting)  . Decreased appetite (decreased hunger)  Note: Each of the side effects above was reported in 90% or greater of patients treated with dacarbazine. Not all possible side effects are included above.  Warnings and Precautions  . Severe bone marrow suppression which can be life-threatening. This is a decrease in the number of white blood cells, red blood cells, and platelets. This may raise your risk of infection, make you tired and weak (fatigue), and raise your risk of bleeding.  . Changes in your liver function which can cause liver failure  . Allergic reactions, including anaphylaxis are rare but may happen in some patients. Signs of allergic reaction to  this drug may be swelling of the face, feeling like your tongue or throat are swelling, trouble breathing, rash, itching, fever, chills, feeling dizzy, and/or feeling that your heart is beating in a fast or not normal way. If this happens, do not take another dose of this drug. You should get urgent medical treatment.  . Skin and tissue irritation including redness, pain, warmth, or swelling at the IV site if the drug leaks out of the vein and into nearby tissue.  Note: Some of the side effects above are very rare. If you have concerns and/or questions, please discuss them with your medical team.  Important Information  . This drug may be present in the saliva, tears, sweat, urine, stool, vomit, semen, and vaginal secretions. Talk to your doctor and/or your nurse about the necessary precautions to take during this time.  Treating Side Effects  . Drink plenty of fluids (a minimum of eight glasses per day is recommended).  . If you throw up or have loose bowel movements, you should drink more fluids so that you do not become dehydrated (lack of water in the body from losing too much fluid).  . To help with nausea and vomiting, eat small,  frequent meals instead of three large meals a day. Choose foods and drinks that are at room temperature. Ask your nurse or doctor about other helpful tips and medicine that is available to help stop or lessen these symptoms.  . To help with decreased appetite, eat small, frequent meals.  . Eat foods high in calories and protein, such as meat, poultry, fish, dry beans, tofu, eggs, nuts, milk, yogurt, cheese, ice cream, pudding, and nutritional supplements.  . Manage tiredness by pacing your activities for the day.  . Be sure to include periods of rest between energy-draining activities.  . To decrease the risk of infection, wash your hands regularly.  . Avoid close contact with people who have a cold, the flu, or other infections.  . Take your temperature  as your doctor or nurse tells you, and whenever you feel like you may have a fever.  . To help decrease the risk of bleeding, use a soft toothbrush. Check with your nurse before using dental floss.  . Be very careful when using knives or tools.  . Use an electric shaver instead of a razor.  Food and Drug Interactions  . There are no known interactions of dacarbazine with food.  . Check with your doctor or pharmacist about all other prescription medicines and over-the-counter medicines and dietary supplements (vitamins, minerals, herbs and others) you are taking before starting this medicine as there are known drug interactions with dacarbazine. Also, check with your doctor or pharmacist before starting any new prescription or over-the-counter medicines, or dietary supplements to make sure that there are no interactions.  When to Call the Doctor Call your doctor or nurse if you have any of these symptoms and/or any new or unusual symptoms:  . Fever of 100.4 F (38 C) or higher  . Chills  . Tiredness that interferes with your daily activities  . Feeling dizzy or lightheaded  . Easy bleeding or bruising  . Nausea that stops you from eating or drinking and/or is not relieved by prescribed medicines  . Throwing up  . Lasting loss of appetite or rapid weight loss of five pounds in a week  . Signs of possible liver problems: dark urine, pale bowel movements, bad stomach pain, feeling very tired and weak, unusual itching, or yellowing of the eyes or skin  . Signs of allergic reaction: swelling of the face, feeling like your tongue or throat are swelling, trouble breathing, rash, itching, fever, chills, feeling dizzy, and/or feeling that your heart is beating in a fast or not normal way. If this happens call 911 for emergency care.  . While you are getting this drug, please tell your nurse right away if you have any pain, redness, or swelling at the site of the IV infusion  . If you  think you may be pregnant or may have impregnated your partner  Reproduction Warnings . Pregnancy warning: This drug can have harmful effects on the unborn baby. For this reason, be sure to talk with your doctor if you are pregnant or planning to become pregnant while receiving this drug. Let your doctor know right away if you think you may be pregnant or may have impregnated your partner. . Breastfeeding warning: It is not known if this drug passes into breast milk. For this reason, women should talk to their doctor about the risks and benefits of breastfeeding during treatment with this drug because this drug may enter the breast milk and cause harm to a breastfeeding baby. Marland Kitchen  Fertility warning: Human fertility studies have not been done with this drug. Talk with your doctor or nurse if you plan to have children. Ask for information on sperm or egg banking.  SELF CARE ACTIVITIES WHILE RECEIVING CHEMOTHERAPY:  Hydration Increase your fluid intake 48 hours prior to treatment and drink at least 8 to 12 cups (64 ounces) of water/decaffeinated beverages per day after treatment. You can still have your cup of coffee or soda but these beverages do not count as part of your 8 to 12 cups that you need to drink daily. No alcohol intake.  Medications Continue taking your normal prescription medication as prescribed.  If you start any new herbal or new supplements please let us know first to make sure it is safe.  Mouth Care Have teeth cleaned professionally before starting treatment. Keep dentures and partial plates clean. Use soft toothbrush and do not use mouthwashes that contain alcohol. Biotene is a good mouthwash that is available at most pharmacies or may be ordered by calling [phone]. Use warm salt water gargles (1 teaspoon salt per 1 quart warm water) before and after meals and at bedtime. If you need dental work, please let the doctor know before you go for your appointment so that we can  coordinate the best possible time for you in regards to your chemo regimen. You need to also let your dentist know that you are actively taking chemo. We may need to do labs prior to your dental appointment.  Skin Care Always use sunscreen that has not expired and with SPF (Sun Protection Factor) of 50 or higher. Wear hats to protect your head from the sun. Remember to use sunscreen on your hands, ears, face, & feet.  Use good moisturizing lotions such as udder cream, eucerin, or even Vaseline. Some chemotherapies can cause dry skin, color changes in your skin and nails.    . Avoid long, hot showers or baths. . Use gentle, fragrance-free soaps and laundry detergent. . Use moisturizers, preferably creams or ointments rather than lotions because the thicker consistency is better at preventing skin dehydration. Apply the cream or ointment within 15 minutes of showering. Reapply moisturizer at night, and moisturize your hands every time after you wash them.  Hair Loss (if your doctor says your hair will fall out)  . If your doctor says that your hair is likely to fall out, decide before you begin chemo whether you want to wear a wig. You may want to shop before treatment to match your hair color. . Hats, turbans, and scarves can also camouflage hair loss, although some people prefer to leave their heads uncovered. If you go bare-headed outdoors, be sure to use sunscreen on your scalp. . Cut your hair short. It eases the inconvenience of shedding lots of hair, but it also can reduce the emotional impact of watching your hair fall out. . Don't perm or color your hair during chemotherapy. Those chemical treatments are already damaging to hair and can enhance hair loss. Once your chemo treatments are done and your hair has grown back, it's OK to resume dyeing or perming hair.  With chemotherapy, hair loss is almost always temporary. But when it grows back, it may be a different color or texture. In older  adults who still had hair color before chemotherapy, the new growth may be completely gray.  Often, new hair is very fine and soft.  Infection Prevention Please wash your hands for at least 30 seconds using warm soapy  water. Handwashing is the #1 way to prevent the spread of germs. Stay away from sick people or people who are getting over a cold. If you develop respiratory systems such as green/yellow mucus production or productive cough or persistent cough let us know and we will see if you need an antibiotic. It is a good idea to keep a pair of gloves on when going into grocery stores/[employer] to decrease your risk of coming into contact with germs on the carts, etc. Carry alcohol hand gel with you at all times and use it frequently if out in public. If your temperature reaches 100.5 or higher please call the clinic and let us know.  If it is after hours or on the weekend please go to the ER if your temperature is over 100.5.  Please have your own personal thermometer at home to use.    Sex and bodily fluids If you are going to have sex, a condom must be used to protect the person that isn't taking chemotherapy. Chemo can decrease your libido (sex drive). For a few days after chemotherapy, chemotherapy can be excreted through your bodily fluids.  When using the toilet please close the lid and flush the toilet twice.  Do this for a few day after you have had chemotherapy.   Effects of chemotherapy on your sex life Some changes are simple and won't last long. They won't affect your sex life permanently.  Sometimes you may feel: . too tired . not strong enough to be very active . sick or sore  . not in the mood . anxious or low Your anxiety might not seem related to sex. For example, you may be worried about the cancer and how your treatment is going. Or you may be worried about money, or about how you family are coping with your illness.  These things can cause stress, which can affect your interest  in sex. It's important to talk to your partner about how you feel.  Remember - the changes to your sex life don't usually last long. There's usually no medical reason to stop having sex during chemo. The drugs won't have any long term physical effects on your performance or enjoyment of sex. Cancer can't be passed on to your partner during sex  Contraception It's important to use reliable contraception during treatment. Avoid getting pregnant while you or your partner are having chemotherapy. This is because the drugs may harm the baby. Sometimes chemotherapy drugs can leave a man or woman infertile.  This means you would not be able to have children in the future. You might want to talk to someone about permanent infertility. It can be very difficult to learn that you may no longer be able to have children. Some people find counselling helpful. There might be ways to preserve your fertility, although this is easier for men than for women. You may want to speak to a fertility expert. You can talk about sperm banking or harvesting your eggs. You can also ask about other fertility options, such as donor eggs. If you have or have had breast cancer, your doctor might advise you not to take the contraceptive pill. This is because the hormones in it might affect the cancer. It is not known for sure whether or not chemotherapy drugs can be passed on through semen or secretions from the vagina. Because of this some doctors advise people to use a barrier method if you have sex during treatment. This applies to vaginal, anal  or oral sex. Generally, doctors advise a barrier method only for the time you are actually having the treatment and for about a week after your treatment. Advice like this can be worrying, but this does not mean that you have to avoid being intimate with your partner. You can still have close contact with your partner and continue to enjoy sex.  Animals If you have cats or birds we just ask that you  not change the litter or change the cage.  Please have someone else do this for you while you are on chemotherapy.   Food Safety During and After Cancer Treatment Food safety is important for people both during and after cancer treatment. Cancer and cancer treatments, such as chemotherapy, radiation therapy, and stem cell/bone marrow transplantation, often weaken the immune system. This makes it harder for your body to protect itself from foodborne illness, also called food poisoning. Foodborne illness is caused by eating food that contains harmful bacteria, parasites, or viruses.  Foods to avoid Some foods have a higher risk of becoming tainted with bacteria. These include: Marland Kitchen Unwashed fresh fruit and vegetables, especially leafy vegetables that can hide dirt and other contaminants . Raw sprouts, such as alfalfa sprouts . Raw or undercooked beef, especially ground beef, or other raw or undercooked meat and poultry . Fatty, fried, or spicy foods immediately before or after treatment.  These can sit heavy on your stomach and make you feel nauseous. . Raw or undercooked shellfish, such as oysters. . Sushi and sashimi, which often contain raw fish.  . Unpasteurized beverages, such as unpasteurized fruit juices, raw milk, raw yogurt, or cider . Undercooked eggs, such as soft boiled, over easy, and poached; raw, unpasteurized eggs; or foods made with raw egg, such as homemade raw cookie dough and homemade mayonnaise  Simple steps for food safety  Shop smart. . Do not buy food stored or displayed in an unclean area. . Do not buy bruised or damaged fruits or vegetables. . Do not buy cans that have cracks, dents, or bulges. . Pick up foods that can spoil at the end of your shopping trip and store them in a cooler on the way home.  Prepare and clean up foods carefully. . Rinse all fresh fruits and vegetables under running water, and dry them with a clean towel or paper towel. . Clean the top of cans  before opening them. . After preparing food, wash your hands for 20 seconds with hot water and soap. Pay special attention to areas between fingers and under nails. . Clean your utensils and dishes with hot water and soap. Marland Kitchen Disinfect your kitchen and cutting boards using 1 teaspoon of liquid, unscented bleach mixed into 1 quart of water.    Dispose of old food. . Eat canned and packaged food before its expiration date (the "use by" or "best before" date). . Consume refrigerated leftovers within 3 to 4 days. After that time, throw out the food. Even if the food does not smell or look spoiled, it still may be unsafe. Some bacteria, such as Listeria, can grow even on foods stored in the refrigerator if they are kept for too long.  Take precautions when eating out. . At restaurants, avoid buffets and salad bars where food sits out for a long time and comes in contact with many people. Food can become contaminated when someone with a virus, often a norovirus, or another "bug" handles it. . Put any leftover food in a "to-go" container  yourself, rather than having the server do it. And, refrigerate leftovers as soon as you get home. . Choose restaurants that are clean and that are willing to prepare your food as you order it cooked.   AT HOME MEDICATIONS:                                                                                                                                                                Compazine/Prochlorperazine 10mg  tablet. Take 1 tablet every 6 hours as needed for nausea/vomiting. (This can make you sleepy)   EMLA cream. Apply a quarter size amount to port site 1 hour prior to chemo. Do not rub in. Cover with plastic wrap.    Diarrhea Sheet   If you are having loose stools/diarrhea, please purchase Imodium and begin taking as outlined:  At the first sign of poorly formed or loose stools you should begin taking Imodium (loperamide) 2 mg capsules.  Take two tablets  (4mg ) followed by one tablet (2mg ) every 2 hours - DO NOT EXCEED 8 tablets in 24 hours.  If it is bedtime and you are having loose stools, take 2 tablets at bedtime, then 2 tablets every 4 hours until morning.   Always call the [facility] if you are having loose stools/diarrhea that you can't get under control.  Loose stools/diarrhea leads to dehydration (loss of water) in your body.  We have other options of trying to get the loose stools/diarrhea to stop but you must let us know!   Constipation Sheet  Colace - 100 mg capsules - take 2 capsules daily.  If this doesn't help then you can increase to 2 capsules twice daily.  Please call if the above does not work for you. Do not go more than 2 days without a bowel movement.  It is very important that you do not become constipated.  It will make you feel sick to your stomach (nausea) and can cause abdominal pain and vomiting.  Nausea Sheet   Compazine/Prochlorperazine 10mg  tablet. Take 1 tablet every 6 hours as needed for nausea/vomiting (This can make you drowsy).  If you are having persistent nausea (nausea that does not stop) please call the [facility] and let us know the amount of nausea that you are experiencing.  If you begin to vomit, you need to call the [facility] and if it is the weekend and you have vomited more than one time and can't get it to stop-go to the Emergency Room.  Persistent nausea/vomiting can lead to dehydration (loss of fluid in your body) and will make you feel very weak and unwell. Ice chips, sips of clear liquids, foods that are at room temperature, crackers, and toast tend to be better tolerated.   SYMPTOMS TO REPORT AS SOON  AS POSSIBLE AFTER TREATMENT:  FEVER GREATER THAN 100.5 F  CHILLS WITH OR WITHOUT FEVER  NAUSEA AND VOMITING THAT IS NOT CONTROLLED WITH YOUR NAUSEA MEDICATION  UNUSUAL SHORTNESS OF BREATH  UNUSUAL BRUISING OR BLEEDING  TENDERNESS IN MOUTH AND THROAT WITH OR WITHOUT   PRESENCE OF  ULCERS  URINARY PROBLEMS  BOWEL PROBLEMS  UNUSUAL RASH     Wear comfortable clothing and clothing appropriate for easy access to any Portacath or PICC line. Let us know if there is anything that we can do to make your therapy better!    What to do if you need assistance after hours or on the weekends: CALL [phone].  HOLD on the line, do not hang up.  You will hear multiple messages but at the end you will be connected with a nurse triage line.  They will contact the doctor if necessary.  Most of the time they will be able to assist you.  Do not call the hospital operator.      I have been informed and understand all of the instructions given to me and have received a copy. I have been instructed to call the clinic [phone] or my family physician as soon as possible for continued medical care, if indicated. I do not have any more questions at this time but understand that I may call the [facility] or the Patient Navigator at [phone] during office hours should I have questions or need assistance in obtaining follow-up care.

## 2020-01-11 NOTE — Progress Notes (Signed)
[facility] Initial Psychosocial Assessment Clinical Social Work  Clinical Social Work contacted by phone to assess psychosocial, emotional, mental health, and spiritual needs of the patient.   Barriers to care/review of distress screen:  - Transportation:  Do you anticipate any problems getting to appointments?  Do you have someone who can help run errands for you if you need it?  No issues - has plenty of people who will help.  Mother and stepmother both have flexible schedules.   - Help at home:  What is your living situation (alone, family, other)?  If you are physically unable to care for yourself, who would you call on to help you?  Lives w fiance and grandfather.  They can help if needed.   - Support system:  What does your support system look like?  Who would you call on if you needed some kind of practical help?  What if you needed someone to talk to for emotional support?  Strong support and family - "I couldn't ask for any better."   - Finances:  Are you concerned about finances.  Considering returning to work?  If not, applying for disability?  Works at [employer], has been told that he can continue to work as long as he feels well enough.  Aware that he needs to stay socially distant as much as possible. Work is modifying his work duties - will be learning to do more computer based activities vs heavy physical labor.  Wants to continue as normal a lifestyle as possible.  "I don't want to stop working."  Will transfer his care to [place] because it is closer to his work.  Gets two weeks sick time, two weeks vacation, 2 floating holidays - has significant time built up and will use that for treatments.  Will be able to use as he needs to miss work.  Has FMLA   What is your understanding of where you are with your cancer? Its cause?  Your treatment plan and what happens next?  "Definitely shell shock."  "It's curable, but everyone seems to be very optimistic for a good outcome in the end."   Aware that he will have chemotherapy, "might be sick some, need to be careful of sunlight."  Initially looked in mirror and saw lump on neck - 3 weeks ago process started, diagnosis was quickly established.  Treatment will begin shortly.    What are your worries for the future as you begin treatment for cancer?  "I am just ready to get it started so it can get over with."  "I try to stay offline", family has read a lot.    What are your hopes and priorities during your treatment? What is important to you? What are your goals for your care? Loves to be outside, fixes up dirt track car, hunt/fish.    What are you willing to sacrifice during your treatment?  Anxious to just get treatment started, aware that he has a 6 month schedule of chemotherapy.  What are you NOT willing to sacrifice during your treatment?  Ability to continue to work, wants to continue to be able to be outdoors - "something I enjoy/look forward to."  Wants providers to be "straight with me."  Appreciates honest information from oncology treatment team.     CSW Summary:  Patient and family psychosocial functioning including strengths, limitations, and coping skills:  24 year old male newly diagnosed w Hodgkins Lymphoma after noticing lump on neck 3 weeks ago.  Diagnosis  of cancer was unexpected.  Currently working and wants to continue to do so.  Care will transfer to [facility] so that he will be closer to his work during treatments.  Work has been very supportive and accommodating - he will also turn in paperwork for FMLA at next oncology visit.  Large family is very supportive.  Has a positive attitude, confident that, with treatment, his disease will be cured and he can resume normal life.  An active, outdoor lifestyle is important to patient - he is also aware that he needs to take precautions not be be in sunlight and to avoid contact w people that might lead to infection.    Identifications of barriers to care:  None  noted  Availability of community resources: Patient and Family Support Team, can also access Leukemia/Lymphoma Society ([email or web address].com/) or resources specifically for adolescent/young adult cancer survivors like [employer] and/or [employer]  Clinical Social Worker follow up needed: No.  Patient and/or family are encouraged to reach out as needed for support/resources.    Edwyna Shell, LCSW Clinical Social Worker Phone:  [phone] Cell:  [phone]

## 2020-01-12 ENCOUNTER — Encounter: Payer: Self-pay | Admitting: Hematology and Oncology

## 2020-01-12 ENCOUNTER — Inpatient Hospital Stay (HOSPITAL_COMMUNITY): Payer: BC Managed Care – PPO

## 2020-01-12 ENCOUNTER — Other Ambulatory Visit: Payer: Self-pay

## 2020-01-12 VITALS — BP 144/65 | HR 96 | Temp 97.7°F | Resp 18 | Wt 253.2 lb

## 2020-01-12 DIAGNOSIS — C8118 Nodular sclerosis classical Hodgkin lymphoma, lymph nodes of multiple sites: Secondary | ICD-10-CM

## 2020-01-12 DIAGNOSIS — M545 Low back pain: Secondary | ICD-10-CM | POA: Diagnosis not present

## 2020-01-12 DIAGNOSIS — Z803 Family history of malignant neoplasm of breast: Secondary | ICD-10-CM | POA: Diagnosis not present

## 2020-01-12 DIAGNOSIS — R05 Cough: Secondary | ICD-10-CM | POA: Diagnosis not present

## 2020-01-12 DIAGNOSIS — R197 Diarrhea, unspecified: Secondary | ICD-10-CM | POA: Diagnosis not present

## 2020-01-12 DIAGNOSIS — M25551 Pain in right hip: Secondary | ICD-10-CM | POA: Diagnosis not present

## 2020-01-12 DIAGNOSIS — Z801 Family history of malignant neoplasm of trachea, bronchus and lung: Secondary | ICD-10-CM | POA: Diagnosis not present

## 2020-01-12 DIAGNOSIS — Z79899 Other long term (current) drug therapy: Secondary | ICD-10-CM | POA: Diagnosis not present

## 2020-01-12 DIAGNOSIS — K5900 Constipation, unspecified: Secondary | ICD-10-CM | POA: Diagnosis not present

## 2020-01-12 DIAGNOSIS — Z5111 Encounter for antineoplastic chemotherapy: Secondary | ICD-10-CM | POA: Diagnosis not present

## 2020-01-12 DIAGNOSIS — C8190 Hodgkin lymphoma, unspecified, unspecified site: Secondary | ICD-10-CM | POA: Diagnosis not present

## 2020-01-12 DIAGNOSIS — I10 Essential (primary) hypertension: Secondary | ICD-10-CM | POA: Diagnosis not present

## 2020-01-12 DIAGNOSIS — M25552 Pain in left hip: Secondary | ICD-10-CM | POA: Diagnosis not present

## 2020-01-12 DIAGNOSIS — R590 Localized enlarged lymph nodes: Secondary | ICD-10-CM | POA: Diagnosis not present

## 2020-01-12 LAB — COMPREHENSIVE METABOLIC PANEL
ALT: 33 U/L (ref 0–44)
AST: 14 U/L — ABNORMAL LOW (ref 15–41)
Albumin: 3.3 g/dL — ABNORMAL LOW (ref 3.5–5.0)
Alkaline Phosphatase: 84 U/L (ref 38–126)
Anion gap: 11 (ref 5–15)
BUN: 15 mg/dL (ref 6–20)
CO2: 25 mmol/L (ref 22–32)
Calcium: 8.9 mg/dL (ref 8.9–10.3)
Chloride: 102 mmol/L (ref 98–111)
Creatinine, Ser: 0.87 mg/dL (ref 0.61–1.24)
GFR calc Af Amer: >60 mL/min (ref >60)
GFR calc non Af Amer: >60 mL/min (ref >60)
Glucose, Bld: 148 mg/dL — ABNORMAL HIGH (ref 70–99)
Potassium: 3.8 mmol/L (ref 3.5–5.1)
Sodium: 138 mmol/L (ref 135–145)
Total Bilirubin: 0.2 mg/dL — ABNORMAL LOW (ref 0.3–1.2)
Total Protein: 7.5 g/dL (ref 6.5–8.1)

## 2020-01-12 LAB — CBC WITH DIFFERENTIAL/PLATELET
Abs Immature Granulocytes: 0.06 10*3/uL (ref 0.00–0.07)
Basophils Absolute: 0.1 10*3/uL (ref 0.0–0.1)
Basophils Relative: 1 %
Eosinophils Absolute: 0.4 10*3/uL (ref 0.0–0.5)
Eosinophils Relative: 3 %
HCT: 36.3 % — ABNORMAL LOW (ref 39.0–52.0)
Hemoglobin: 11.4 g/dL — ABNORMAL LOW (ref 13.0–17.0)
Immature Granulocytes: 1 %
Lymphocytes Relative: 11 %
Lymphs Abs: 1.5 10*3/uL (ref 0.7–4.0)
MCH: 27.7 pg (ref 26.0–34.0)
MCHC: 31.4 g/dL (ref 30.0–36.0)
MCV: 88.1 fL (ref 80.0–100.0)
Monocytes Absolute: 0.7 10*3/uL (ref 0.1–1.0)
Monocytes Relative: 5 %
Neutro Abs: 10.7 10*3/uL — ABNORMAL HIGH (ref 1.7–7.7)
Neutrophils Relative %: 79 %
Platelets: 453 10*3/uL — ABNORMAL HIGH (ref 150–400)
RBC: 4.12 MIL/uL — ABNORMAL LOW (ref 4.22–5.81)
RDW: 13.4 % (ref 11.5–15.5)
WBC: 13.3 10*3/uL — ABNORMAL HIGH (ref 4.0–10.5)
nRBC: 0 % (ref 0.0–0.2)

## 2020-01-12 LAB — URIC ACID: Uric Acid, Serum: 3.4 mg/dL — ABNORMAL LOW (ref 3.7–8.6)

## 2020-01-12 MED ORDER — VINBLASTINE SULFATE CHEMO INJECTION 1 MG/ML
6.1000 mg/m2 | Freq: Once | INTRAVENOUS | Status: AC
  Administered 2020-01-12: 15 mg via INTRAVENOUS
  Filled 2020-01-12: qty 15

## 2020-01-12 MED ORDER — PALONOSETRON HCL INJECTION 0.25 MG/5ML
0.2500 mg | Freq: Once | INTRAVENOUS | Status: AC
  Administered 2020-01-12: 11:00:00 0.25 mg via INTRAVENOUS
  Filled 2020-01-12: qty 5

## 2020-01-12 MED ORDER — HEPARIN SOD (PORK) LOCK FLUSH 100 UNIT/ML IV SOLN
500.0000 [IU] | Freq: Once | INTRAVENOUS | Status: AC | PRN
  Administered 2020-01-12: 500 [IU]

## 2020-01-12 MED ORDER — SODIUM CHLORIDE 0.9 % IV SOLN
375.0000 mg/m2 | Freq: Once | INTRAVENOUS | Status: AC
  Administered 2020-01-12: 920 mg via INTRAVENOUS
  Filled 2020-01-12: qty 92

## 2020-01-12 MED ORDER — SODIUM CHLORIDE 0.9 % IV SOLN
10.0000 mg | Freq: Once | INTRAVENOUS | Status: AC
  Administered 2020-01-12: 10 mg via INTRAVENOUS
  Filled 2020-01-12: qty 10

## 2020-01-12 MED ORDER — SODIUM CHLORIDE 0.9 % IV SOLN: Freq: Once | INTRAVENOUS | Status: AC

## 2020-01-12 MED ORDER — DOXORUBICIN HCL CHEMO IV INJECTION 2 MG/ML
25.0000 mg/m2 | Freq: Once | INTRAVENOUS | Status: AC
  Administered 2020-01-12: 62 mg via INTRAVENOUS
  Filled 2020-01-12: qty 31

## 2020-01-12 MED ORDER — SODIUM CHLORIDE 0.9 % IV SOLN
150.0000 mg | Freq: Once | INTRAVENOUS | Status: AC
  Administered 2020-01-12: 11:00:00 150 mg via INTRAVENOUS
  Filled 2020-01-12: qty 150

## 2020-01-12 MED ORDER — SODIUM CHLORIDE 0.9 % IV SOLN
10.0000 [IU]/m2 | Freq: Once | INTRAVENOUS | Status: AC
  Administered 2020-01-12: 25 [IU] via INTRAVENOUS
  Filled 2020-01-12: qty 8.33

## 2020-01-12 MED ORDER — SODIUM CHLORIDE 0.9% FLUSH
10.0000 mL | INTRAVENOUS | Status: DC | PRN
  Administered 2020-01-12: 10 mL

## 2020-01-12 NOTE — Progress Notes (Signed)

## 2020-01-12 NOTE — Progress Notes (Signed)
Labs meet parameters for treatment today. Proceed as planned.  Treatment given per orders. Patient tolerated it well without problems. Vitals stable and discharged home from clinic ambulatory. Follow up as scheduled.

## 2020-01-12 NOTE — Patient Instructions (Signed)
[facility] Discharge Instructions for Patients Receiving Chemotherapy  Today you received the following chemotherapy agents   To help prevent nausea and vomiting after your treatment, we encourage you to take your nausea medication   If you develop nausea and vomiting that is not controlled by your nausea medication, call the clinic.   BELOW ARE SYMPTOMS THAT SHOULD BE REPORTED IMMEDIATELY:  *FEVER GREATER THAN 100.5 F  *CHILLS WITH OR WITHOUT FEVER  NAUSEA AND VOMITING THAT IS NOT CONTROLLED WITH YOUR NAUSEA MEDICATION  *UNUSUAL SHORTNESS OF BREATH  *UNUSUAL BRUISING OR BLEEDING  TENDERNESS IN MOUTH AND THROAT WITH OR WITHOUT PRESENCE OF ULCERS  *URINARY PROBLEMS  *BOWEL PROBLEMS  UNUSUAL RASH Items with * indicate a potential emergency and should be followed up as soon as possible.  Feel free to call the clinic should you have any questions or concerns. The clinic phone number is [phone].  Please show the CHEMO ALERT CARD at check-in to the Emergency Department and triage nurse.   

## 2020-01-12 NOTE — Progress Notes (Signed)
[place] [phone]   Fax:([phone]) [place] NOTE  Patient Care Team: Mikey Kirschner, MD as PCP - General (Family Medicine) Derek Jack, MD as Medical Oncologist (Oncology) Donetta Potts, RN as Oncology Nurse Navigator (Oncology)  Hematological/Oncological History # Classical Hodgkin Lymphoma, Stage IV 1) 12/27/2019: patient presented to PCP with enlarged neck mass. CT neck was ordered and showed Left supraclavicular conglomerate adenopathy. Additional nonenlarged and mildly enlarged cervical lymph nodes, asymmetrically greater on the left. Partially imaged mediastinal adenopathy. 2) 01/03/2020: biopsy performed which showed Classical Hodgkin lymphoma 3)  01/05/2020: PET CT scan showed intense uptake in the left cervical and supraclavicular lymph nodes, increased hepatic lymph nodes, and splenic uptake. Additionally there was diffusely increased uptake in the axial/proximal skeleton.  4) 01/12/2020: Cycle 1 Day 1 of ABVD Chemotherapy. Administered in [place] under the supervision of Dr. Delton Coombes 5) 01/13/2020: transferred, establish care with Dr. Lorenso Courier   CHIEF COMPLAINTS/PURPOSE OF CONSULTATION:  "newly diagnosed Hodgkin Lymphoma "  HISTORY OF PRESENTING ILLNESS:  Tyler [name] 24 y.o. male with medical history significant for GERD, HTN, and ADD who presents to transfer care for treatment of newly diagnosed Stage IV Hodgkin's lymphoma.   On review of the previous records [name] initially had a CT scan of the neck performed on 12/27/2019 at the request of his PCP after presenting with enlarged mass in the neck x3 weeks.  CT scan showed a left supraclavicular conglomerate adenopathy as well as additionally nonenlarged and mildly enlarged cervical lymph nodes, predominantly on the left side.  There is also noted to be mediastinal adenopathy.  On 01/03/2020 the patient had a biopsy performed which showed classical Hodgkin's lymphoma.  On  01/05/2020 the patient had a PET CT scan which showed left cervical and supraclavicular neck lymph nodes, increased hepatic lymph nodes, splenic uptake.  He was also noted to have diffusely increased uptake within the axial and proximal skeleton.  On 01/12/2020 the patient underwent his first treatment of ABVD chemotherapy cycle 1 day 1.  This was administered in [place] with the supervision of Dr. Delton Coombes.  Due to the patient wanting to have the chemotherapy administered closer to where he works he requested for referral here to the [facility].  He established care with Dr. Lorenso Courier on 01/13/2020.  On exam today [name] notes that he is tolerating his treatment well.  He reports that he does have some fatigue, and did have a single episode of diarrhea which has been treated with Imodium.  The patient does endorse having fevers on and off and has been having difficulty with night sweats.  Per his fiance he was in the room he has been sleeping on top of a towel as he has been consistently soaking it.  On further discussion Mr. Godbee notes that he first noticed this lump approximately 3 weeks ago when looking in the mirror.  He presented to his PCP who immediately had it imaged and biopsy was performed confirming the diagnosis.  The full detailed history is listed above.  Symptomatically [name] has been doing fairly well.  He notes that he has been taking Tylenol and ibuprofen for the above-mentioned fevers and night sweats.  He notes that his appetite has been less good than it was before all this began.  He does note having periodic itching, though it is not whole body.  He notes that it "picks a spot" and becomes itchy all day.  He has not  had to take any Benadryl yet as the itching has not become terribly severe.  He does note that he is prone to coughing spells, but this predates his lymphadenopathy.  Of note during his PFT studies he did have a coughing spell that resulted in him  vomiting.  Otherwise [name] notes that he is a never smoker and does not currently drink any alcohol.  He works at [employer].  On further discussion he has not been having any chest pain, abdominal pain, or other sources of pain.  A full 10 point ROS is listed below.  MEDICAL HISTORY:  Past Medical History:  Diagnosis Date  . ADD (attention deficit disorder)    Inattention type  . Cyclic vomiting syndrome 2009  . GERD (gastroesophageal reflux disease)   . Hypertension     SURGICAL HISTORY: Past Surgical History:  Procedure Laterality Date  . closed reduction rt. wrist    . IR IMAGING GUIDED PORT INSERTION  12/31/2019  . MASS BIOPSY Left 01/03/2020   Procedure: OPEN NECK BIOPSY;  Surgeon: Leta Baptist, MD;  Location: [place];  Service: ENT;  Laterality: Left;    SOCIAL HISTORY: Social History   Socioeconomic History  . Marital status: Single    Spouse name: Not on file  . Number of children: 0  . Years of education: Not on file  . Highest education level: Not on file  Occupational History  . Not on file  Tobacco Use  . Smoking status: Never Smoker  . Smokeless tobacco: Former Network engineer and Sexual Activity  . Alcohol use: Never  . Drug use: Never  . Sexual activity: Not Currently  Other Topics Concern  . Not on file  Social History Narrative  . Not on file   Social Determinants of Health   Financial Resource Strain: Low Risk   . Difficulty of Paying Living Expenses: Not hard at all  Food Insecurity: No Food Insecurity  . Worried About Charity fundraiser in the Last Year: Never true  . Ran Out of Food in the Last Year: Never true  Transportation Needs: No Transportation Needs  . Lack of Transportation (Medical): No  . Lack of Transportation (Non-Medical): No  Physical Activity: Inactive  . Days of Exercise per Week: 0 days  . Minutes of Exercise per Session: 0 min  Stress: No Stress Concern Present  . Feeling of Stress : Not at all   Social Connections: Somewhat Isolated  . Frequency of Communication with Friends and Family: More than three times a week  . Frequency of Social Gatherings with Friends and Family: More than three times a week  . Attends Religious Services: More than 4 times per year  . Active Member of Clubs or Organizations: No  . Attends Archivist Meetings: Never  . Marital Status: Never married  Intimate Partner Violence: Not At Risk  . Fear of Current or Ex-Partner: No  . Emotionally Abused: No  . Physically Abused: No  . Sexually Abused: No    FAMILY HISTORY: Family History  Problem Relation Age of Onset  . Cancer Paternal Grandmother   . Hypertension Mother   . Hypertension Father     ALLERGIES:  is allergic to ace inhibitors.  MEDICATIONS:  Current Outpatient Medications  Medication Sig Dispense Refill  . acetaminophen (TYLENOL) 325 MG tablet Take 650 mg by mouth 2 (two) times daily as needed for moderate pain or fever.    Marland Kitchen allopurinol (ZYLOPRIM) 300  MG tablet Take 1 tablet (300 mg total) by mouth daily. 30 tablet 1  . amLODipine (NORVASC) 10 MG tablet Take 1 tablet (10 mg total) by mouth daily. 90 tablet 1  . bleomycin in sodium chloride 0.9 % 50 mL Inject into the vein every 14 (fourteen) days. Day 1, 15 every 28 days    . DACARBAZINE IV Inject into the vein every 14 (fourteen) days. Day 1, 15 every 28 days    . DOXORUBICIN HCL IV Inject into the vein every 14 (fourteen) days. Day 1, 15 every 28 days    . lidocaine-prilocaine (EMLA) cream Apply small amount over port site and cover with plastic wrap 1 hour before appointment. 30 g 3  . omeprazole (PRILOSEC OTC) 20 MG tablet Take 1 tablet (20 mg total) by mouth daily. 90 tablet 1  . prochlorperazine (COMPAZINE) 10 MG tablet Take 1 tablet (10 mg total) by mouth every 6 (six) hours as needed (Nausea or vomiting). 30 tablet 1  . sodium chloride (OCEAN) 0.65 % SOLN nasal spray Place 1 spray into both nostrils as needed for  congestion.    . vinBLAStine in sodium chloride 0.9 % 50 mL Inject into the vein. Day 1, 15 every 28 days     No current facility-administered medications for this visit.    REVIEW OF SYSTEMS:   Constitutional: ( + ) fevers, ( + )  chills , ( + ) night sweats Eyes: ( - ) blurriness of vision, ( - ) double vision, ( - ) watery eyes Ears, nose, mouth, throat, and face: ( - ) mucositis, ( - ) sore throat Respiratory: ( + ) cough, ( - ) dyspnea, ( - ) wheezes Cardiovascular: ( - ) palpitation, ( - ) chest discomfort, ( - ) lower extremity swelling Gastrointestinal:  ( - ) nausea, ( - ) heartburn, ( - ) change in bowel habits Skin: ( - ) abnormal skin rashes (+) ithcing Lymphatics: ( - ) new lymphadenopathy, ( - ) easy bruising Neurological: ( - ) numbness, ( - ) tingling, ( - ) new weaknesses Behavioral/Psych: ( - ) mood change, ( - ) new changes  All other systems were reviewed with the patient and are negative.  PHYSICAL EXAMINATION: ECOG PERFORMANCE STATUS: 0 - Asymptomatic  Vitals:   01/13/20 1304  BP: (!) 158/80  Pulse: 87  Resp: 17  Temp: 98.9 F (37.2 C)  SpO2: 98%   Filed Weights   01/13/20 1304  Weight: 250 lb 11.2 oz (113.7 kg)    GENERAL: well appearing young Caucasian male in NAD  SKIN: skin color, texture, turgor are normal, no rashes or significant lesions EYES: conjunctiva are pink and non-injected, sclera clear NECK: supple, non-tender LYMPH:  Marked increase in size of left supraclavicular lymph node. Fullness in left cervical chain.  LUNGS: clear to auscultation and percussion with normal breathing effort HEART: regular rate & rhythm and no murmurs and no lower extremity edema ABDOMEN: soft, non-tender, non-distended, normal bowel sounds Musculoskeletal: no cyanosis of digits and no clubbing  PSYCH: alert & oriented x 3, fluent speech NEURO: no focal motor/sensory deficits  LABORATORY DATA:  I have reviewed the data as listed CBC Latest Ref Rng & Units  01/12/2020 12/24/2019  WBC 4.0 - 10.5 K/uL 13.3(H) 17.6(H)  Hemoglobin 13.0 - 17.0 g/dL 11.4(L) 12.9(L)  Hematocrit 39.0 - 52.0 % 36.3(L) 38.8  Platelets 150 - 400 K/uL 453(H) 400    CMP Latest Ref Rng & Units 01/12/2020 12/24/2019  08/02/2017  Glucose 70 - 99 mg/dL 148(H) 97 87  BUN 6 - 20 mg/dL 15 13 14   Creatinine 0.61 - 1.24 mg/dL 0.87 0.98 1.06  Sodium 135 - 145 mmol/L 138 140 141  Potassium 3.5 - 5.1 mmol/L 3.8 4.7 4.5  Chloride 98 - 111 mmol/L 102 100 99  CO2 22 - 32 mmol/L 25 25 25   Calcium 8.9 - 10.3 mg/dL 8.9 9.3 9.5  Total Protein 6.5 - 8.1 g/dL 7.5 7.3 7.1  Total Bilirubin 0.3 - 1.2 mg/dL 0.2(L) 0.3 0.3  Alkaline Phos 38 - 126 U/L 84 126(H) 76  AST 15 - 41 U/L 14(L) 9 10  ALT 0 - 44 U/L 33 16 31     PATHOLOGY: SURGICAL PATHOLOGY  CASE: [id number]  PATIENT: Roselle Locus  Surgical Pathology Report      Clinical History: enlarged lymph nodes (cm)     FINAL MICROSCOPIC DIAGNOSIS:   A. SOFT TISSUE MASS, LEFT NECK, BIOPSY:  - Classical Hodgkin lymphoma, see comment.    COMMENT:   The biopsy reveals an atypical lymphoreticular infiltrate with large  abnormal lymphocytes. The abnormal lymphocytes and vesicular nuclei  with occasional prominent nucleoli occasional binucleation, consistent  with Hodgkin Reed-Sternberg (HRS) cells. There are lacunar cells.  There are background eosinophils, small lymphocytes, neutrophils, and  histiocytes. Immunohistochemistry reveals the HRS cells are positive  for CD30, CD15, and PAX 5 (characteristically weak). They are positive  for EBV in situ hybridization. CD20 highlights background residual B  cells. CD3 highlight admixed T cells. The overall findings are  consistent with a classical Hodgkin lymphoma. While thick fibrous bands  are not seenin the biopsy, the background and EBV positivity are  suggestive of a nodular sclerosis type. Dr. Benjamine Mola was paged on 01/05/2020.    GROSS DESCRIPTION:   Received fresh are 2  fragments of tan-pink soft tissue, 0.6 and 0.7 cm.  One half specimen submitted in RPMI for possible flow cytometry. Touch  prep slide is prepared. Remaining specimen submitted in 1 cassette.  (AK 01/03/2020)   RADIOGRAPHIC STUDIES:  DG Chest 2 View  Result Date: 12/23/2019 CLINICAL DATA:  Productive cough for several months. Right supraclavicular lymphadenopathy. EXAM: CHEST - 2 VIEW COMPARISON:  None. FINDINGS: Heart size is normal. A left mediastinal mass is seen which is likely located in the anterior or middle mediastinum. Both lungs are clear. No evidence of pleural effusion. No skeletal abnormality visualized. IMPRESSION: Left-sided mediastinal mass or lymphadenopathy. Chest CT with contrast is recommended for further evaluation. These results will be called to the ordering clinician or representative by the Radiologist Assistant, and communication documented in the PACS or zVision Dashboard. Electronically Signed   By: Marlaine Hind M.D.   On: 12/23/2019 17:16   CT Soft Tissue Neck W Contrast  Result Date: 12/27/2019 CLINICAL DATA:  Lymphadenopathy, cough EXAM: CT NECK WITH CONTRAST TECHNIQUE: Multidetector CT imaging of the neck was performed using the standard protocol following the bolus administration of intravenous contrast. CONTRAST:  137m OMNIPAQUE IOHEXOL 300 MG/ML  SOLN COMPARISON:  None. FINDINGS: Pharynx and larynx: Prominent adenoids and palatine and lingual tonsils. Otherwise unremarkable. Salivary glands: Parotid and submandibular glands are unremarkable. Thyroid: Unremarkable. Adenopathy abuts the left lobe but appears separate. Lymph nodes: Left supraclavicular conglomerate adenopathy. Representative axial measurement of 3.8 x 6.6 cm. Additional mildly enlarged and nonenlarged left cervical lymph nodes. For example, left level 3 node or adjacent nodes measuring 1.5 x 2.1 cm. Vascular: Major neck vessels are patent. Limited intracranial: No  abnormal enhancement. Visualized orbits:  Unremarkable. Mastoids and visualized paranasal sinuses: Minimal mucosal thickening. Mastoids are clear. Skeleton: No significant abnormality Upper chest: Dictated separately. Mediastinal adenopathy is partially imaged. IMPRESSION: Left supraclavicular conglomerate adenopathy. Additional nonenlarged and mildly enlarged cervical lymph nodes, asymmetrically greater on the left. Partially imaged mediastinal adenopathy. This may be infectious/inflammatory or lymphoproliferative in etiology. Electronically Signed   By: Macy Mis M.D.   On: 12/27/2019 17:51   CT Chest W Contrast  Result Date: 12/27/2019 CLINICAL DATA:  24 year old male with productive cough and mediastinal mass/adenopathy. EXAM: CT CHEST WITH CONTRAST TECHNIQUE: Multidetector CT imaging of the chest was performed during intravenous contrast administration. CONTRAST:  144m OMNIPAQUE IOHEXOL 300 MG/ML  SOLN COMPARISON:  Chest radiograph dated 12/23/2019. FINDINGS: Cardiovascular: There is no cardiomegaly or pericardial effusion. The thoracic aorta is unremarkable. The origins of the great vessels of the aortic arch appear patent as visualized. The central pulmonary arteries are grossly unremarkable for the degree of opacification. Mediastinum/Nodes: Mediastinal adenopathy measure 3.8 cm in short axis in the prevascular space, and 15 mm in short axis anterior to the SVC. There is a cluster of enlarged lymph node in the left supraclavicular region with combined dimension of 7.2 x 5.3 cm. There is encasement, with associated mass effect and compression of the left internal jugular vein. Lungs/Pleura: The lungs are clear. There is no pleural effusion or pneumothorax. The central airways are patent. Upper Abdomen: No acute abnormality. Musculoskeletal: No chest wall abnormality. No acute or significant osseous findings. IMPRESSION: 1. Left supraclavicular and mediastinal lymphadenopathy most concerning for lymphoma. 2. No acute intrathoracic pathology.  Electronically Signed   By: AAnner CreteM.D.   On: 12/27/2019 17:42   NM PET Image Initial (PI) Skull Base To Thigh  Result Date: 01/05/2020 CLINICAL DATA:  Initial treatment strategy for lymphoma. EXAM: NUCLEAR MEDICINE PET SKULL BASE TO THIGH TECHNIQUE: 12.2 mCi F-18 FDG was injected intravenously. Full-ring PET imaging was performed from the skull base to thigh after the radiotracer. CT data was obtained and used for attenuation correction and anatomic localization. Fasting blood glucose: 96 mg/dl COMPARISON:  CT 12/27/2019 FINDINGS: Mediastinal blood pool activity: SUV max 1.99 Liver activity: SUV max 3.3 NECK: FDG avid left supraclavicular adenopathy is identified. -Index left level 3 lymph node conglomeration measures 3.2 x 2.4 cm with SUV max of 12.17. -Left supraclavicular nodal conglomeration measures 6.1 x 7.7 cm with SUV max of 15.9. -No FDG avid right cervical lymph nodes. Incidental CT findings: none CHEST: Bilateral mediastinal lymph nodes identified. -Left pre-vascular nodal mass measures 5.3 x 3.5 cm with SUV max of 10.21. -Right sided precaval lymph node measures 1.2 x 2.3 cm and has an SUV max of 7.76. No hypermetabolic axillary lymph nodes. No FDG avid hilar lymph nodes. No pleural effusion. No airspace consolidation, atelectasis or pneumothorax. Incidental CT findings: None ABDOMEN/PELVIS: No abnormal hypermetabolic activity within the liver, pancreas, or adrenal glands. -Mild increased uptake localizing to normal size porta hepatic lymph nodes has an SUV max of 4.49. -spleen measures 12.38 by 10.9 x 5.1 cm (volume = 360 cm^3). SUV max within the spleen equals 4.9. Incidental CT findings: none SKELETON: Diffusely increased radiotracer uptake throughout the visualized axial and appendicular skeleton is noted. Incidental CT findings: none IMPRESSION: 1. There is intense FDG uptake associated with left cervical, left supraclavicular, and bilateral mediastinal adenopathy compatible with the  clinical history of lymphoma. 2. Mild increased uptake is associated with normal size porta hepatic lymph nodes. 3. Mildly increased  FDG uptake associated with the spleen which is above liver activity. 4. Diffusely increased uptake within the axial and proximal appendicular skeleton. This is a nonspecific finding. This may reflect changes due to anemia. Diffuse bone marrow involvement by lymphoma not excluded. Electronically Signed   By: Kerby Moors M.D.   On: 01/05/2020 10:52   ECHOCARDIOGRAM COMPLETE  Result Date: 01/10/2020    ECHOCARDIOGRAM REPORT   Patient Name:   DANFORD TAT Date of Exam: 01/10/2020 Medical Rec #:  [id number]      Height:       75.0 in Accession #:    [id number]     Weight:       252.5 lb Date of Birth:  [birth year]      BSA:          2.423 m Patient Age:    23 years       BP:           148/85 mmHg Patient Gender: M              HR:           97 bpm. Exam Location:  [facility] Procedure: 2D Echo Indications:    chemo  History:        Patient has no prior history of Echocardiogram examinations.                 Risk Factors:Non-Smoker and Hypertension. Lymphadenopathy,                 Hodgkin's disease ([place]).  Sonographer:    Leavy Cella RDCS (AE) Referring Phys: [place]  1. Left ventricular ejection fraction, by estimation, is 60 to 65%. The left ventricle has normal function. The left ventricle has no regional wall motion abnormalities. Left ventricular diastolic parameters were normal.  2. Right ventricular systolic function is normal. The right ventricular size is normal.  3. The mitral valve is normal in structure. No evidence of mitral valve regurgitation.  4. The aortic valve is tricuspid. Aortic valve regurgitation is not visualized. No aortic stenosis is present.  5. The inferior vena cava is normal in size with greater than 50% respiratory variability, suggesting right atrial pressure of 3 mmHg. FINDINGS  Left Ventricle: Left ventricular  ejection fraction, by estimation, is 60 to 65%. The left ventricle has normal function. The left ventricle has no regional wall motion abnormalities. The left ventricular internal cavity size was normal in size. There is  borderline concentric left ventricular hypertrophy. Left ventricular diastolic parameters were normal. Right Ventricle: The right ventricular size is normal. No increase in right ventricular wall thickness. Right ventricular systolic function is normal. Left Atrium: Left atrial size was normal in size. Right Atrium: Right atrial size was normal in size. Pericardium: There is no evidence of pericardial effusion. Mitral Valve: The mitral valve is normal in structure. No evidence of mitral valve regurgitation. Tricuspid Valve: The tricuspid valve is grossly normal. Tricuspid valve regurgitation is not demonstrated. Aortic Valve: The aortic valve is tricuspid. Aortic valve regurgitation is not visualized. No aortic stenosis is present. Pulmonic Valve: The pulmonic valve was grossly normal. Pulmonic valve regurgitation is not visualized. Aorta: The aortic root is normal in size and structure. Venous: The inferior vena cava is normal in size with greater than 50% respiratory variability, suggesting right atrial pressure of 3 mmHg. IAS/Shunts: No atrial level shunt detected by color flow Doppler.  LEFT VENTRICLE PLAX 2D LVIDd:  4.91 cm  Diastology LVIDs:         3.50 cm  LV e' lateral:   12.80 cm/s LV PW:         1.17 cm  LV E/e' lateral: 5.0 LV IVS:        1.05 cm  LV e' medial:    9.90 cm/s LVOT diam:     2.00 cm  LV E/e' medial:  6.4 LVOT Area:     3.14 cm  RIGHT VENTRICLE RV S prime:     15.40 cm/s TAPSE (M-mode): 2.6 cm LEFT ATRIUM             Index       RIGHT ATRIUM           Index LA diam:        3.00 cm 1.24 cm/m  RA Area:     15.10 cm LA Vol (A2C):   62.7 ml 25.88 ml/m RA Volume:   45.20 ml  18.66 ml/m LA Vol (A4C):   40.4 ml 16.67 ml/m LA Biplane Vol: 50.7 ml 20.93 ml/m   AORTA Ao  Root diam: 2.50 cm MITRAL VALVE MV Area (PHT): 2.87 cm    SHUNTS MV Decel Time: 264 msec    Systemic Diam: 2.00 cm MV E velocity: 63.50 cm/s MV A velocity: 57.70 cm/s MV E/A ratio:  1.10 Kate Sable MD Electronically signed by Kate Sable MD Signature Date/Time: 01/10/2020/10:23:21 AM    Final    IR IMAGING GUIDED PORT INSERTION  Result Date: 12/31/2019 CLINICAL DATA:  Adenopathy, probable lymphoma. Durable venous access requested for planned chemotherapy. EXAM: TUNNELED PORT CATHETER PLACEMENT WITH ULTRASOUND AND FLUOROSCOPIC GUIDANCE FLUOROSCOPY TIME:  0.1 minute; 11  uGym2 DAP ANESTHESIA/SEDATION: Intravenous Fentanyl 139mg and Versed 374mwere administered as conscious sedation during continuous monitoring of the patient's level of consciousness and physiological / cardiorespiratory status by the radiology RN, with a total moderate sedation time of 17 minutes. TECHNIQUE: The procedure, risks, benefits, and alternatives were explained to the patient. Questions regarding the procedure were encouraged and answered. The patient understands and consents to the procedure. As antibiotic prophylaxis, cefazolin 2 g was ordered pre-procedure and administered intravenously within one hour of incision. Patency of the right IJ vein was confirmed with ultrasound with image documentation. An appropriate skin site was determined. Skin site was marked. Region was prepped using maximum barrier technique including cap and mask, sterile gown, sterile gloves, large sterile sheet, and Chlorhexidine as cutaneous antisepsis. The region was infiltrated locally with 1% lidocaine. Under real-time ultrasound guidance, the right IJ vein was accessed with a 21 gauge micropuncture needle; the needle tip within the vein was confirmed with ultrasound image documentation. Needle was exchanged over a 018 guidewire for transitional dilator, and vascular measurement was performed. A small incision was made on the right anterior chest  wall and a subcutaneous pocket fashioned. The power-injectable port was positioned and its catheter tunneled to the right IJ dermatotomy site. The transitional dilator was exchanged over an Amplatz wire for a peel-away sheath, through which the port catheter, which had been trimmed to the appropriate length, was advanced and positioned under fluoroscopy with its tip at the cavoatrial junction. Spot chest radiograph confirms good catheter position and no pneumothorax. The port was flushed per protocol. The pocket was closed with deep interrupted and subcuticular continuous 3-0 Monocryl sutures. The incisions were covered with Dermabond then covered with a sterile dressing. The patient tolerated the procedure well. COMPLICATIONS: COMPLICATIONS None immediate IMPRESSION:  Technically successful right IJ power-injectable port catheter placement. Ready for routine use. Electronically Signed   By: Lucrezia Europe M.D.   On: 12/31/2019 13:31    ASSESSMENT & PLAN Tyler [name] 24 y.o. male with medical history significant for GERD, HTN, and ADD who presents to transfer care for treatment of newly diagnosed Stage IV Hodgkins lymphoma.  After review of the pathology, the imaging, and discussion with the patient his findings are most consistent with a newly diagnosed stage IV Hodgkin's lymphoma.  Of note the treatment for stage III and stage IV of Hodgkin's lymphoma is exactly the same.  On the PET scan imaging the findings are consistent with bone marrow involvement and therefore we would classify him as a stage IV.  However the patient has not undergone bone marrow biopsy to confirm this given that it would not change treatment.  As such I would recommend that we assume that his cancer is currently stage IV although this does not have implications in our treatment plan.  The patient has had port placement, TTE, and PFTs.  He appears to be young and had good functional status and therefore I believe he is appropriate for  ABVD chemotherapy.  He received his first cycle of this yesterday at [facility] and reportedly would like to receive his additional cycles here at the [facility] cancer center.  Current plan is for the patient to receive 2 cycles of ABVD chemotherapy followed by PET/CT scan to determine the further course of action.  We are currently planning for him to return on 01/26/2020 for cycle 1 day 15 of his treatment.   # Classical Hodgkin Lymphoma, Stage IV --patient started therapy with ABVD Cycle 1 Day 1 on 01/12/2020 (yesterday) at [place]. He will receive his Day 3 GCSF here --of note, he is a Stage III or IV given the imaging. The PET scan seems to support marrow involvement, however as this would not change management a bone marrow biopsy has not been performed.  --will continue treatment here with ABVD, plan for C1D15 to be on 01/26/2020 --plan for 2 cycles (28 days) and then re-imaging with PET/CT scan to determine the response to treatment --patient has undergone PFTs, TTE, and port placement.  --RTC prior to [place] on 01/26/2020.   #Symptom Management --patient provided with zofran/compazine --imodium for diarrhea PRN. He notes 1 use so far --EMLA cream for port --Tylenol/ibuprofen for night sweats PRN --benadryl 8m PO for itching PRN  No orders of the defined types were placed in this encounter.   All questions were answered. The patient knows to call the clinic with any problems, questions or concerns.  A total of more than 60 minutes were spent on this encounter and over half of that time was spent on counseling and coordination of care as outlined above. (Of note, [facility] this does not qualify as a 'new' patient visit)  JLedell Peoples MD Department of Hematology/Oncology [facility] HospitalPhone: [phone]: [phone]: [email or web address]  01/16/2020 4:24 PM    YAmada Jupiter  Romaguera J, Pro B, Goy A, Wang M. Safety and Efficacy of Once-per-cycle Pegfilgrastim in Support of ABVD Chemotherapy in Patients with Hodgkin Lymphoma. European Journal of Cancer. 26144;31:5400-8676  --Pegfilgrastim was found to be safe in support of ABVD chemotherapy that is administered every 14 d. Pegfilgrastim was also effective in maintaining ABVD dose intensity, and keeping planned dose of chemotherapy  on schedule.  Binder AF, Irma Newness A. The Use of Filgrastim in Patients with Hodgkin Lymphoma Receiving ABVD. Int J Hematol Oncol Stem Cell Res. 2017 Oct 1;11(4):286-292. PMID: 41937902; PMCID: IOX7353299.  -- This study does not find evidence that the combination of bleomycin and G-CSF increases the risk for bleomycin- induced pulmonary toxicity. We recommend G-CSF use in HL patients receiving bleomycin when needed to maintain dose intensity.

## 2020-01-13 ENCOUNTER — Telehealth: Payer: Self-pay | Admitting: Hematology and Oncology

## 2020-01-13 ENCOUNTER — Other Ambulatory Visit: Payer: Self-pay

## 2020-01-13 ENCOUNTER — Inpatient Hospital Stay: Payer: BC Managed Care – PPO

## 2020-01-13 ENCOUNTER — Inpatient Hospital Stay: Payer: BC Managed Care – PPO | Attending: Hematology and Oncology | Admitting: Hematology and Oncology

## 2020-01-13 VITALS — BP 158/80 | HR 87 | Temp 98.9°F | Resp 17 | Ht 75.0 in | Wt 250.7 lb

## 2020-01-13 DIAGNOSIS — Z5189 Encounter for other specified aftercare: Secondary | ICD-10-CM | POA: Diagnosis not present

## 2020-01-13 DIAGNOSIS — Z79899 Other long term (current) drug therapy: Secondary | ICD-10-CM | POA: Insufficient documentation

## 2020-01-13 DIAGNOSIS — C8118 Nodular sclerosis classical Hodgkin lymphoma, lymph nodes of multiple sites: Secondary | ICD-10-CM

## 2020-01-13 DIAGNOSIS — I10 Essential (primary) hypertension: Secondary | ICD-10-CM | POA: Insufficient documentation

## 2020-01-13 DIAGNOSIS — C8191 Hodgkin lymphoma, unspecified, lymph nodes of head, face, and neck: Secondary | ICD-10-CM | POA: Diagnosis not present

## 2020-01-13 DIAGNOSIS — R197 Diarrhea, unspecified: Secondary | ICD-10-CM | POA: Insufficient documentation

## 2020-01-13 DIAGNOSIS — R591 Generalized enlarged lymph nodes: Secondary | ICD-10-CM | POA: Diagnosis not present

## 2020-01-13 DIAGNOSIS — R222 Localized swelling, mass and lump, trunk: Secondary | ICD-10-CM | POA: Diagnosis not present

## 2020-01-13 NOTE — Telephone Encounter (Signed)
Scheduled apt per 3/18 sch message - pt aware of appt

## 2020-01-13 NOTE — Telephone Encounter (Signed)
24 hour follow up call today. Patient states he feels a little fatigued but otherwise doing ok. No concerns or questions at this time.

## 2020-01-14 ENCOUNTER — Other Ambulatory Visit: Payer: Self-pay | Admitting: Hematology and Oncology

## 2020-01-14 ENCOUNTER — Other Ambulatory Visit: Payer: Self-pay

## 2020-01-14 ENCOUNTER — Inpatient Hospital Stay: Payer: BC Managed Care – PPO

## 2020-01-14 VITALS — BP 150/74 | HR 83 | Temp 98.5°F | Resp 18

## 2020-01-14 DIAGNOSIS — Z5189 Encounter for other specified aftercare: Secondary | ICD-10-CM | POA: Diagnosis not present

## 2020-01-14 DIAGNOSIS — Z79899 Other long term (current) drug therapy: Secondary | ICD-10-CM | POA: Diagnosis not present

## 2020-01-14 DIAGNOSIS — C8191 Hodgkin lymphoma, unspecified, lymph nodes of head, face, and neck: Secondary | ICD-10-CM | POA: Diagnosis not present

## 2020-01-14 DIAGNOSIS — R197 Diarrhea, unspecified: Secondary | ICD-10-CM | POA: Diagnosis not present

## 2020-01-14 DIAGNOSIS — C8118 Nodular sclerosis classical Hodgkin lymphoma, lymph nodes of multiple sites: Secondary | ICD-10-CM

## 2020-01-14 DIAGNOSIS — I10 Essential (primary) hypertension: Secondary | ICD-10-CM | POA: Diagnosis not present

## 2020-01-14 MED ORDER — PEGFILGRASTIM-JMDB 6 MG/0.6ML ~~LOC~~ SOSY
6.0000 mg | PREFILLED_SYRINGE | Freq: Once | SUBCUTANEOUS | Status: AC
  Administered 2020-01-14: 6 mg via SUBCUTANEOUS

## 2020-01-14 MED ORDER — PEGFILGRASTIM-JMDB 6 MG/0.6ML ~~LOC~~ SOSY
PREFILLED_SYRINGE | SUBCUTANEOUS | Status: AC
  Filled 2020-01-14: qty 0.6

## 2020-01-14 NOTE — Patient Instructions (Signed)

## 2020-01-16 ENCOUNTER — Encounter: Payer: Self-pay | Admitting: Hematology and Oncology

## 2020-01-19 ENCOUNTER — Other Ambulatory Visit: Payer: Self-pay

## 2020-01-19 ENCOUNTER — Ambulatory Visit (HOSPITAL_COMMUNITY): Payer: BC Managed Care – PPO | Admitting: Hematology

## 2020-01-19 ENCOUNTER — Inpatient Hospital Stay (HOSPITAL_COMMUNITY): Payer: BC Managed Care – PPO

## 2020-01-19 ENCOUNTER — Encounter (HOSPITAL_COMMUNITY): Payer: Self-pay | Admitting: Hematology

## 2020-01-19 ENCOUNTER — Inpatient Hospital Stay (HOSPITAL_COMMUNITY): Payer: BC Managed Care – PPO | Admitting: Hematology

## 2020-01-19 VITALS — BP 135/82 | HR 83 | Temp 97.7°F | Resp 18 | Wt 254.9 lb

## 2020-01-19 DIAGNOSIS — Z801 Family history of malignant neoplasm of trachea, bronchus and lung: Secondary | ICD-10-CM | POA: Diagnosis not present

## 2020-01-19 DIAGNOSIS — M25552 Pain in left hip: Secondary | ICD-10-CM | POA: Diagnosis not present

## 2020-01-19 DIAGNOSIS — R05 Cough: Secondary | ICD-10-CM | POA: Diagnosis not present

## 2020-01-19 DIAGNOSIS — I10 Essential (primary) hypertension: Secondary | ICD-10-CM | POA: Diagnosis not present

## 2020-01-19 DIAGNOSIS — K5900 Constipation, unspecified: Secondary | ICD-10-CM | POA: Diagnosis not present

## 2020-01-19 DIAGNOSIS — M25551 Pain in right hip: Secondary | ICD-10-CM | POA: Diagnosis not present

## 2020-01-19 DIAGNOSIS — C8118 Nodular sclerosis classical Hodgkin lymphoma, lymph nodes of multiple sites: Secondary | ICD-10-CM

## 2020-01-19 DIAGNOSIS — Z803 Family history of malignant neoplasm of breast: Secondary | ICD-10-CM | POA: Diagnosis not present

## 2020-01-19 DIAGNOSIS — C8190 Hodgkin lymphoma, unspecified, unspecified site: Secondary | ICD-10-CM | POA: Diagnosis not present

## 2020-01-19 DIAGNOSIS — R197 Diarrhea, unspecified: Secondary | ICD-10-CM | POA: Diagnosis not present

## 2020-01-19 DIAGNOSIS — Z79899 Other long term (current) drug therapy: Secondary | ICD-10-CM | POA: Diagnosis not present

## 2020-01-19 DIAGNOSIS — R590 Localized enlarged lymph nodes: Secondary | ICD-10-CM | POA: Diagnosis not present

## 2020-01-19 DIAGNOSIS — M545 Low back pain: Secondary | ICD-10-CM | POA: Diagnosis not present

## 2020-01-19 DIAGNOSIS — Z5111 Encounter for antineoplastic chemotherapy: Secondary | ICD-10-CM | POA: Diagnosis not present

## 2020-01-19 LAB — CBC WITH DIFFERENTIAL/PLATELET
Abs Immature Granulocytes: 0.51 10*3/uL — ABNORMAL HIGH (ref 0.00–0.07)
Basophils Absolute: 0.1 10*3/uL (ref 0.0–0.1)
Basophils Relative: 3 %
Eosinophils Absolute: 0.5 10*3/uL (ref 0.0–0.5)
Eosinophils Relative: 8 %
HCT: 35.2 % — ABNORMAL LOW (ref 39.0–52.0)
Hemoglobin: 11.3 g/dL — ABNORMAL LOW (ref 13.0–17.0)
Immature Granulocytes: 9 %
Lymphocytes Relative: 38 %
Lymphs Abs: 2.1 10*3/uL (ref 0.7–4.0)
MCH: 28 pg (ref 26.0–34.0)
MCHC: 32.1 g/dL (ref 30.0–36.0)
MCV: 87.1 fL (ref 80.0–100.0)
Monocytes Absolute: 0.9 10*3/uL (ref 0.1–1.0)
Monocytes Relative: 17 %
Neutro Abs: 1.4 10*3/uL — ABNORMAL LOW (ref 1.7–7.7)
Neutrophils Relative %: 25 %
Platelets: 274 10*3/uL (ref 150–400)
RBC: 4.04 MIL/uL — ABNORMAL LOW (ref 4.22–5.81)
RDW: 13.1 % (ref 11.5–15.5)
WBC: 5.6 10*3/uL (ref 4.0–10.5)
nRBC: 0 % (ref 0.0–0.2)

## 2020-01-19 LAB — COMPREHENSIVE METABOLIC PANEL
ALT: 63 U/L — ABNORMAL HIGH (ref 0–44)
AST: 19 U/L (ref 15–41)
Albumin: 3.6 g/dL (ref 3.5–5.0)
Alkaline Phosphatase: 103 U/L (ref 38–126)
Anion gap: 9 (ref 5–15)
BUN: 17 mg/dL (ref 6–20)
CO2: 28 mmol/L (ref 22–32)
Calcium: 9 mg/dL (ref 8.9–10.3)
Chloride: 100 mmol/L (ref 98–111)
Creatinine, Ser: 0.87 mg/dL (ref 0.61–1.24)
GFR calc Af Amer: >60 mL/min (ref >60)
GFR calc non Af Amer: >60 mL/min (ref >60)
Glucose, Bld: 80 mg/dL (ref 70–99)
Potassium: 3.8 mmol/L (ref 3.5–5.1)
Sodium: 137 mmol/L (ref 135–145)
Total Bilirubin: 0.1 mg/dL — ABNORMAL LOW (ref 0.3–1.2)
Total Protein: 7.2 g/dL (ref 6.5–8.1)

## 2020-01-19 LAB — URIC ACID: Uric Acid, Serum: 4.2 mg/dL (ref 3.7–8.6)

## 2020-01-19 MED ORDER — SODIUM CHLORIDE 0.9% FLUSH
10.0000 mL | Freq: Once | INTRAVENOUS | Status: AC
  Administered 2020-01-19: 15:00:00 10 mL via INTRAVENOUS

## 2020-01-19 MED ORDER — HEPARIN SOD (PORK) LOCK FLUSH 100 UNIT/ML IV SOLN
500.0000 [IU] | Freq: Once | INTRAVENOUS | Status: AC
  Administered 2020-01-19: 500 [IU] via INTRAVENOUS

## 2020-01-19 NOTE — Patient Instructions (Addendum)
[place] at [facility] Discharge Instructions  You were seen today by Dr. Delton Coombes. He went over your recent lab results and how you've been feeling since your first treatment. He will see you back as scheduled for labs, treatment and follow up.   Thank you for choosing [place] at [facility] to provide your oncology and hematology care.  To afford each patient quality time with our provider, please arrive at least 15 minutes before your scheduled appointment time.   If you have a lab appointment with the [facility] please come in thru the  Main Entrance and check in at the main information desk  You need to re-schedule your appointment should you arrive 10 or more minutes late.  We strive to give you quality time with our providers, and arriving late affects you and other patients whose appointments are after yours.  Also, if you no show three or more times for appointments you may be dismissed from the clinic at the providers discretion.     Again, thank you for choosing [facility].  Our hope is that these requests will decrease the amount of time that you wait before being seen by our physicians.       _____________________________________________________________  Should you have questions after your visit to [facility], please contact our office at [phone] between the hours of 8:00 a.m. and 4:30 p.m.  Voicemails left after 4:00 p.m. will not be returned until the following business day.  For prescription refill requests, have your pharmacy contact our office and allow 72 hours.    Cancer Center Support Programs:   > Cancer Support Group  2nd Tuesday of the month 1pm-2pm, Journey Room

## 2020-01-19 NOTE — Progress Notes (Signed)
Labs drawn from port.  Patients port flushed without difficulty.  Good blood return noted with no bruising or swelling noted at site.  Band aid applied.  VSS with discharge and left ambulatory with no s/s of distress noted.  

## 2020-01-19 NOTE — Assessment & Plan Note (Signed)
1.  Stage IVAE classical Hodgkin's disease: -PET scan on 01/05/2020 showed hypermetabolic left cervical, supraclavicular, bilateral mediastinal adenopathy.  Hypermetabolic porta hepatic lymph nodes.  Mildly increased FDG uptake associated with the spleen which is about liver activity.  Diffuse increased uptake within the axial and proximal appendicular skeleton. -IPI score of 3. -Cycle 1 day 1 of ABVD on 01/12/2020. -He reported low back and bilateral hip pain which started yesterday. -He did have decrease in appetite and eating less.  He lost 4 pounds in the last 1 week.  He has taken Compazine as needed. -Reports improvement in the cough since treatment started. -We reviewed his labs.  Electrolytes are normal.  Creatinine is normal.  ALT is elevated at 63. -CBC shows slightly decreased ANC 1400.  Hemoglobin and platelets are stable. -He will come back next week for day 15 of cycle 1.  2.  Tumor lysis prophylaxis: -He will continue allopurinol 300 mg daily. -Uric acid is 4.2.  Potassium and creatinine are normal.  3.  Low back pain and hip pain: -Likely from growth factors. -He is taking Claritin.  He will use Percocet left over from his port placement as needed.

## 2020-01-19 NOTE — Progress Notes (Signed)
Tyler [name],  [id number]   CLINIC:  Medical Oncology/Hematology  PCP:  Mikey Kirschner, [place] [address] [phone]   REASON FOR VISIT:  Follow-up for Hodgkin's disease.  CURRENT THERAPY: ABVD.  BRIEF ONCOLOGIC HISTORY:  Oncology History  Hodgkin's disease ([place])  01/06/2020 Initial Diagnosis   Hodgkin's disease ([place])   01/12/2020 -  Chemotherapy   The patient had DOXOrubicin (ADRIAMYCIN) chemo injection 62 mg, 25 mg/m2 = 62 mg, Intravenous,  Once, 1 of 2 cycles Administration: 62 mg (01/12/2020) palonosetron (ALOXI) injection 0.25 mg, 0.25 mg, Intravenous,  Once, 1 of 2 cycles Administration: 0.25 mg (01/12/2020) bleomycin (BLEOCIN) 25 Units in sodium chloride 0.9 % 50 mL chemo infusion, 10 Units/m2 = 25 Units, Intravenous,  Once, 1 of 2 cycles Administration: 25 Units (01/12/2020) dacarbazine (DTIC) 920 mg in sodium chloride 0.9 % 250 mL chemo infusion, 375 mg/m2 = 920 mg, Intravenous,  Once, 1 of 2 cycles Administration: 920 mg (01/12/2020) fosaprepitant (EMEND) 150 mg in sodium chloride 0.9 % 145 mL IVPB, 150 mg, Intravenous,  Once, 1 of 2 cycles Administration: 150 mg (01/12/2020) vinBLAStine (VELBAN) 15 mg in sodium chloride 0.9 % 50 mL chemo infusion, 6.1 mg/m2 = 14.8 mg, Intravenous, Once, 1 of 2 cycles Administration: 15 mg (01/12/2020)  for chemotherapy treatment.       CANCER STAGING: Cancer Staging Hodgkin's disease [facility]) Staging form: Hodgkin and Non-Hodgkin Lymphoma, AJCC 8th Edition - Clinical stage from 01/06/2020: Stage IV (Hodgkin lymphoma, A - Asymptomatic) - Unsigned    INTERVAL HISTORY:  Tyler [name] 24 y.o. male seen for follow-up after chemotherapy.  He received first cycle of ABVD last week.  He reported that cough has already improved.  He reported low back pain and bilateral hip pain rated as 5 out of 10 in intensity which started yesterday.  He received udenyca injection on  Friday.  Also reported decrease in appetite and is eating less.  He lost about 4 pounds as a result.  Reported some constipation alternating with diarrhea.    REVIEW OF SYSTEMS:  Review of Systems  Respiratory: Positive for cough.   Gastrointestinal: Positive for constipation and diarrhea.  Musculoskeletal: Positive for back pain.  All other systems reviewed and are negative.    PAST MEDICAL/SURGICAL HISTORY:  Past Medical History:  Diagnosis Date  . ADD (attention deficit disorder)    Inattention type  . Cyclic vomiting syndrome 2009  . GERD (gastroesophageal reflux disease)   . Hypertension    Past Surgical History:  Procedure Laterality Date  . closed reduction rt. wrist    . IR IMAGING GUIDED PORT INSERTION  12/31/2019  . MASS BIOPSY Left 01/03/2020   Procedure: OPEN NECK BIOPSY;  Surgeon: Leta Baptist, MD;  Location: [facility];  Service: ENT;  Laterality: Left;     SOCIAL HISTORY:  Social History   Socioeconomic History  . Marital status: Single    Spouse name: Not on file  . Number of children: 0  . Years of education: Not on file  . Highest education level: Not on file  Occupational History  . Not on file  Tobacco Use  . Smoking status: Never Smoker  . Smokeless tobacco: Former Network engineer and Sexual Activity  . Alcohol use: Never  . Drug use: Never  . Sexual activity: Not Currently  Other Topics Concern  . Not on file  Social History Narrative  . Not on  file   Social Determinants of Health   Financial Resource Strain: Low Risk   . Difficulty of Paying Living Expenses: Not hard at all  Food Insecurity: No Food Insecurity  . Worried About Charity fundraiser in the Last Year: Never true  . Ran Out of Food in the Last Year: Never true  Transportation Needs: No Transportation Needs  . Lack of Transportation (Medical): No  . Lack of Transportation (Non-Medical): No  Physical Activity: Inactive  . Days of Exercise per Week: 0 days  .  Minutes of Exercise per Session: 0 min  Stress: No Stress Concern Present  . Feeling of Stress : Not at all  Social Connections: Somewhat Isolated  . Frequency of Communication with Friends and Family: More than three times a week  . Frequency of Social Gatherings with Friends and Family: More than three times a week  . Attends Religious Services: More than 4 times per year  . Active Member of Clubs or Organizations: No  . Attends Archivist Meetings: Never  . Marital Status: Never married  Intimate Partner Violence: Not At Risk  . Fear of Current or Ex-Partner: No  . Emotionally Abused: No  . Physically Abused: No  . Sexually Abused: No    FAMILY HISTORY:  Family History  Problem Relation Age of Onset  . Cancer Paternal Grandmother   . Hypertension Mother   . Hypertension Father     CURRENT MEDICATIONS:  Outpatient Encounter Medications as of 01/19/2020  Medication Sig  . allopurinol (ZYLOPRIM) 300 MG tablet Take 1 tablet (300 mg total) by mouth daily.  Marland Kitchen amLODipine (NORVASC) 10 MG tablet Take 1 tablet (10 mg total) by mouth daily.  . bleomycin in sodium chloride 0.9 % 50 mL Inject into the vein every 14 (fourteen) days. Day 1, 15 every 28 days  . DACARBAZINE IV Inject into the vein every 14 (fourteen) days. Day 1, 15 every 28 days  . DOXORUBICIN HCL IV Inject into the vein every 14 (fourteen) days. Day 1, 15 every 28 days  . omeprazole (PRILOSEC OTC) 20 MG tablet Take 1 tablet (20 mg total) by mouth daily.  . vinBLAStine in sodium chloride 0.9 % 50 mL Inject into the vein. Day 1, 15 every 28 days  . acetaminophen (TYLENOL) 325 MG tablet Take 650 mg by mouth 2 (two) times daily as needed for moderate pain or fever.  . lidocaine-prilocaine (EMLA) cream Apply small amount over port site and cover with plastic wrap 1 hour before appointment. (Patient not taking: Reported on 01/19/2020)  . prochlorperazine (COMPAZINE) 10 MG tablet Take 1 tablet (10 mg total) by mouth every 6  (six) hours as needed (Nausea or vomiting). (Patient not taking: Reported on 01/19/2020)  . sodium chloride (OCEAN) 0.65 % SOLN nasal spray Place 1 spray into both nostrils as needed for congestion.  . [EXPIRED] heparin lock flush 100 unit/mL   . [EXPIRED] sodium chloride flush (NS) 0.9 % injection 10 mL    No facility-administered encounter medications on file as of 01/19/2020.    ALLERGIES:  Allergies  Allergen Reactions  . Ace Inhibitors Swelling     PHYSICAL EXAM:  ECOG Performance status: 0  Vitals:   01/19/20 1537  BP: 135/82  Pulse: 83  Resp: 18  Temp: 97.7 F (36.5 C)  SpO2: 98%   Filed Weights   01/19/20 1537  Weight: 254 lb 14.4 oz (115.6 kg)    Physical Exam Vitals reviewed.  Constitutional:  Appearance: Normal appearance.  Cardiovascular:     Rate and Rhythm: Normal rate and regular rhythm.     Heart sounds: Normal heart sounds.  Pulmonary:     Effort: Pulmonary effort is normal.     Breath sounds: Normal breath sounds.  Abdominal:     General: There is no distension.     Palpations: Abdomen is soft. There is no mass.  Lymphadenopathy:     Cervical: Cervical adenopathy present.  Skin:    General: Skin is warm.  Neurological:     General: No focal deficit present.     Mental Status: He is alert and oriented to person, place, and time.  Psychiatric:        Mood and Affect: Mood normal.        Behavior: Behavior normal.    Left supraclavicular adenopathy has improved in size.  LABORATORY DATA:  I have reviewed the labs as listed.  CBC    Component Value Date/Time   WBC 5.6 01/19/2020 1455   RBC 4.04 (L) 01/19/2020 1455   HGB 11.3 (L) 01/19/2020 1455   HGB 12.9 (L) 12/24/2019 1556   HCT 35.2 (L) 01/19/2020 1455   HCT 38.8 12/24/2019 1556   PLT 274 01/19/2020 1455   PLT 400 12/24/2019 1556   MCV 87.1 01/19/2020 1455   MCV 84 12/24/2019 1556   MCH 28.0 01/19/2020 1455   MCHC 32.1 01/19/2020 1455   RDW 13.1 01/19/2020 1455   RDW 12.9  12/24/2019 1556   LYMPHSABS 2.1 01/19/2020 1455   LYMPHSABS 2.0 12/24/2019 1556   MONOABS 0.9 01/19/2020 1455   EOSABS 0.5 01/19/2020 1455   EOSABS 0.1 12/24/2019 1556   BASOSABS 0.1 01/19/2020 1455   BASOSABS 0.1 12/24/2019 1556   CMP Latest Ref Rng & Units 01/19/2020 01/12/2020 12/24/2019  Glucose 70 - 99 mg/dL 80 148(H) 97  BUN 6 - 20 mg/dL 17 15 13   Creatinine 0.61 - 1.24 mg/dL 0.87 0.87 0.98  Sodium 135 - 145 mmol/L 137 138 140  Potassium 3.5 - 5.1 mmol/L 3.8 3.8 4.7  Chloride 98 - 111 mmol/L 100 102 100  CO2 22 - 32 mmol/L 28 25 25   Calcium 8.9 - 10.3 mg/dL 9.0 8.9 9.3  Total Protein 6.5 - 8.1 g/dL 7.2 7.5 7.3  Total Bilirubin 0.3 - 1.2 mg/dL 0.1(L) 0.2(L) 0.3  Alkaline Phos 38 - 126 U/L 103 84 126(H)  AST 15 - 41 U/L 19 14(L) 9  ALT 0 - 44 U/L 63(H) 33 16       DIAGNOSTIC IMAGING:  I have reviewed scans.     ASSESSMENT & PLAN:   Hodgkin's disease ([place]) 1.  Stage IVAE classical Hodgkin's disease: -PET scan on 01/05/2020 showed hypermetabolic left cervical, supraclavicular, bilateral mediastinal adenopathy.  Hypermetabolic porta hepatic lymph nodes.  Mildly increased FDG uptake associated with the spleen which is about liver activity.  Diffuse increased uptake within the axial and proximal appendicular skeleton. -IPI score of 3. -Cycle 1 day 1 of ABVD on 01/12/2020. -He reported low back and bilateral hip pain which started yesterday. -He did have decrease in appetite and eating less.  He lost 4 pounds in the last 1 week.  He has taken Compazine as needed. -Reports improvement in the cough since treatment started. -We reviewed his labs.  Electrolytes are normal.  Creatinine is normal.  ALT is elevated at 63. -CBC shows slightly decreased ANC 1400.  Hemoglobin and platelets are stable. -He will come back next week for day  15 of cycle 1.  2.  Tumor lysis prophylaxis: -He will continue allopurinol 300 mg daily. -Uric acid is 4.2.  Potassium and creatinine are  normal.  3.  Low back pain and hip pain: -Likely from growth factors. -He is taking Claritin.  He will use Percocet left over from his port placement as needed.      Orders placed this encounter:  Orders Placed This Encounter  Procedures  . CBC with Differential/Platelet  . Comprehensive metabolic panel  . Magnesium  . Phosphorus  . Uric acid      Derek Jack, MD [place] [phone]

## 2020-01-20 NOTE — Progress Notes (Signed)
.  Pharmacist Chemotherapy Monitoring - Follow Up Assessment    I verify that I have reviewed each item in the below checklist:  . Regimen for the patient is scheduled for the appropriate day and plan matches scheduled date. Marland Kitchen Appropriate non-routine labs are ordered dependent on drug ordered. . If applicable, additional medications reviewed and ordered per protocol based on lifetime cumulative doses and/or treatment regimen.   Plan for follow-up and/or issues identified: No . I-vent associated with next due treatment: No . MD and/or nursing notified: No  MARSHALL ELLIFF 01/20/2020 4:22 PM

## 2020-01-26 ENCOUNTER — Inpatient Hospital Stay (HOSPITAL_COMMUNITY): Payer: BC Managed Care – PPO

## 2020-01-26 ENCOUNTER — Other Ambulatory Visit: Payer: Self-pay

## 2020-01-26 ENCOUNTER — Inpatient Hospital Stay (HOSPITAL_BASED_OUTPATIENT_CLINIC_OR_DEPARTMENT_OTHER): Payer: BC Managed Care – PPO | Admitting: Hematology

## 2020-01-26 ENCOUNTER — Encounter (HOSPITAL_COMMUNITY): Payer: Self-pay | Admitting: Hematology

## 2020-01-26 VITALS — BP 140/64 | HR 88 | Temp 97.5°F | Resp 16 | Wt 256.4 lb

## 2020-01-26 DIAGNOSIS — Z803 Family history of malignant neoplasm of breast: Secondary | ICD-10-CM | POA: Diagnosis not present

## 2020-01-26 DIAGNOSIS — Z79899 Other long term (current) drug therapy: Secondary | ICD-10-CM | POA: Diagnosis not present

## 2020-01-26 DIAGNOSIS — I10 Essential (primary) hypertension: Secondary | ICD-10-CM | POA: Diagnosis not present

## 2020-01-26 DIAGNOSIS — C8118 Nodular sclerosis classical Hodgkin lymphoma, lymph nodes of multiple sites: Secondary | ICD-10-CM

## 2020-01-26 DIAGNOSIS — M25552 Pain in left hip: Secondary | ICD-10-CM | POA: Diagnosis not present

## 2020-01-26 DIAGNOSIS — C8190 Hodgkin lymphoma, unspecified, unspecified site: Secondary | ICD-10-CM | POA: Diagnosis not present

## 2020-01-26 DIAGNOSIS — Z5111 Encounter for antineoplastic chemotherapy: Secondary | ICD-10-CM | POA: Diagnosis not present

## 2020-01-26 DIAGNOSIS — K5900 Constipation, unspecified: Secondary | ICD-10-CM | POA: Diagnosis not present

## 2020-01-26 DIAGNOSIS — M25551 Pain in right hip: Secondary | ICD-10-CM | POA: Diagnosis not present

## 2020-01-26 DIAGNOSIS — R05 Cough: Secondary | ICD-10-CM | POA: Diagnosis not present

## 2020-01-26 DIAGNOSIS — R590 Localized enlarged lymph nodes: Secondary | ICD-10-CM | POA: Diagnosis not present

## 2020-01-26 DIAGNOSIS — R197 Diarrhea, unspecified: Secondary | ICD-10-CM | POA: Diagnosis not present

## 2020-01-26 DIAGNOSIS — Z801 Family history of malignant neoplasm of trachea, bronchus and lung: Secondary | ICD-10-CM | POA: Diagnosis not present

## 2020-01-26 DIAGNOSIS — M545 Low back pain: Secondary | ICD-10-CM | POA: Diagnosis not present

## 2020-01-26 LAB — CBC WITH DIFFERENTIAL/PLATELET
Abs Immature Granulocytes: 0.98 10*3/uL — ABNORMAL HIGH (ref 0.00–0.07)
Basophils Absolute: 0.1 10*3/uL (ref 0.0–0.1)
Basophils Relative: 1 %
Eosinophils Absolute: 0.2 10*3/uL (ref 0.0–0.5)
Eosinophils Relative: 1 %
HCT: 36.1 % — ABNORMAL LOW (ref 39.0–52.0)
Hemoglobin: 11.5 g/dL — ABNORMAL LOW (ref 13.0–17.0)
Immature Granulocytes: 9 %
Lymphocytes Relative: 21 %
Lymphs Abs: 2.3 10*3/uL (ref 0.7–4.0)
MCH: 28.3 pg (ref 26.0–34.0)
MCHC: 31.9 g/dL (ref 30.0–36.0)
MCV: 88.7 fL (ref 80.0–100.0)
Monocytes Absolute: 0.8 10*3/uL (ref 0.1–1.0)
Monocytes Relative: 7 %
Neutro Abs: 6.8 10*3/uL (ref 1.7–7.7)
Neutrophils Relative %: 61 %
Platelets: 174 10*3/uL (ref 150–400)
RBC: 4.07 MIL/uL — ABNORMAL LOW (ref 4.22–5.81)
RDW: 14.5 % (ref 11.5–15.5)
WBC: 11.1 10*3/uL — ABNORMAL HIGH (ref 4.0–10.5)
nRBC: 0 % (ref 0.0–0.2)

## 2020-01-26 LAB — COMPREHENSIVE METABOLIC PANEL
ALT: 120 U/L — ABNORMAL HIGH (ref 0–44)
AST: 51 U/L — ABNORMAL HIGH (ref 15–41)
Albumin: 3.8 g/dL (ref 3.5–5.0)
Alkaline Phosphatase: 106 U/L (ref 38–126)
Anion gap: 8 (ref 5–15)
BUN: 17 mg/dL (ref 6–20)
CO2: 27 mmol/L (ref 22–32)
Calcium: 9 mg/dL (ref 8.9–10.3)
Chloride: 103 mmol/L (ref 98–111)
Creatinine, Ser: 0.96 mg/dL (ref 0.61–1.24)
GFR calc Af Amer: >60 mL/min (ref >60)
GFR calc non Af Amer: >60 mL/min (ref >60)
Glucose, Bld: 127 mg/dL — ABNORMAL HIGH (ref 70–99)
Potassium: 4 mmol/L (ref 3.5–5.1)
Sodium: 138 mmol/L (ref 135–145)
Total Bilirubin: 0.6 mg/dL (ref 0.3–1.2)
Total Protein: 7.2 g/dL (ref 6.5–8.1)

## 2020-01-26 LAB — URIC ACID: Uric Acid, Serum: 5.3 mg/dL (ref 3.7–8.6)

## 2020-01-26 LAB — MAGNESIUM: Magnesium: 2.1 mg/dL (ref 1.7–2.4)

## 2020-01-26 LAB — PHOSPHORUS: Phosphorus: 4.1 mg/dL (ref 2.5–4.6)

## 2020-01-26 MED ORDER — DOXORUBICIN HCL CHEMO IV INJECTION 2 MG/ML
25.0000 mg/m2 | Freq: Once | INTRAVENOUS | Status: AC
  Administered 2020-01-26: 62 mg via INTRAVENOUS
  Filled 2020-01-26: qty 31

## 2020-01-26 MED ORDER — SODIUM CHLORIDE 0.9 % IV SOLN
150.0000 mg | Freq: Once | INTRAVENOUS | Status: AC
  Administered 2020-01-26: 150 mg via INTRAVENOUS
  Filled 2020-01-26: qty 150

## 2020-01-26 MED ORDER — SODIUM CHLORIDE 0.9 % IV SOLN
10.0000 [IU]/m2 | Freq: Once | INTRAVENOUS | Status: AC
  Administered 2020-01-26: 25 [IU] via INTRAVENOUS
  Filled 2020-01-26: qty 8.33

## 2020-01-26 MED ORDER — SODIUM CHLORIDE 0.9% FLUSH
10.0000 mL | INTRAVENOUS | Status: DC | PRN
  Administered 2020-01-26: 10 mL

## 2020-01-26 MED ORDER — HEPARIN SOD (PORK) LOCK FLUSH 100 UNIT/ML IV SOLN
500.0000 [IU] | Freq: Once | INTRAVENOUS | Status: AC | PRN
  Administered 2020-01-26: 500 [IU]

## 2020-01-26 MED ORDER — PALONOSETRON HCL INJECTION 0.25 MG/5ML
0.2500 mg | Freq: Once | INTRAVENOUS | Status: AC
  Administered 2020-01-26: 0.25 mg via INTRAVENOUS
  Filled 2020-01-26: qty 5

## 2020-01-26 MED ORDER — SODIUM CHLORIDE 0.9 % IV SOLN
375.0000 mg/m2 | Freq: Once | INTRAVENOUS | Status: AC
  Administered 2020-01-26: 920 mg via INTRAVENOUS
  Filled 2020-01-26: qty 92

## 2020-01-26 MED ORDER — VINBLASTINE SULFATE CHEMO INJECTION 1 MG/ML
6.1000 mg/m2 | Freq: Once | INTRAVENOUS | Status: AC
  Administered 2020-01-26: 15 mg via INTRAVENOUS
  Filled 2020-01-26: qty 15

## 2020-01-26 MED ORDER — SODIUM CHLORIDE 0.9 % IV SOLN: Freq: Once | INTRAVENOUS | Status: AC

## 2020-01-26 MED ORDER — SODIUM CHLORIDE 0.9 % IV SOLN
10.0000 mg | Freq: Once | INTRAVENOUS | Status: AC
  Administered 2020-01-26: 10 mg via INTRAVENOUS
  Filled 2020-01-26: qty 10

## 2020-01-26 NOTE — Patient Instructions (Signed)
[facility] at [facility] Discharge Instructions  You were seen today by Dr. Katragadda. He went over your recent lab results. He will see you back in 2 weeks for labs and follow up.   Thank you for choosing  [facility] Center at [facility] to provide your oncology and hematology care.  To afford each patient quality time with our provider, please arrive at least 15 minutes before your scheduled appointment time.   If you have a lab appointment with the Cancer Center please come in thru the  Main Entrance and check in at the main information desk  You need to re-schedule your appointment should you arrive 10 or more minutes late.  We strive to give you quality time with our providers, and arriving late affects you and other patients whose appointments are after yours.  Also, if you no show three or more times for appointments you may be dismissed from the clinic at the providers discretion.     Again, thank you for choosing [facility].  Our hope is that these requests will decrease the amount of time that you wait before being seen by our physicians.       _____________________________________________________________  Should you have questions after your visit to [facility], please contact our office at [phone] between the hours of 8:00 a.m. and 4:30 p.m.  Voicemails left after 4:00 p.m. will not be returned until the following business day.  For prescription refill requests, have your pharmacy contact our office and allow 72 hours.    Cancer Center Support Programs:   > Cancer Support Group  2nd Tuesday of the month 1pm-2pm, Journey Room    

## 2020-01-26 NOTE — Progress Notes (Signed)
[place], [place] [address]   CLINIC:  Medical Oncology/Hematology  PCP:  Mikey Kirschner, [place] [address] [phone]   REASON FOR VISIT:  Follow-up for Hodgkin's disease.  CURRENT THERAPY: ABVD.  BRIEF ONCOLOGIC HISTORY:  Oncology History  Hodgkin's disease ([facility])  01/06/2020 Initial Diagnosis   Hodgkin's disease ([place])   01/12/2020 -  Chemotherapy   The patient had DOXOrubicin (ADRIAMYCIN) chemo injection 62 mg, 25 mg/m2 = 62 mg, Intravenous,  Once, 1 of 2 cycles Administration: 62 mg (01/12/2020), 62 mg (01/26/2020) palonosetron (ALOXI) injection 0.25 mg, 0.25 mg, Intravenous,  Once, 1 of 2 cycles Administration: 0.25 mg (01/12/2020), 0.25 mg (01/26/2020) bleomycin (BLEOCIN) 25 Units in sodium chloride 0.9 % 50 mL chemo infusion, 10 Units/m2 = 25 Units, Intravenous,  Once, 1 of 2 cycles Administration: 25 Units (01/12/2020), 25 Units (01/26/2020) dacarbazine (DTIC) 920 mg in sodium chloride 0.9 % 250 mL chemo infusion, 375 mg/m2 = 920 mg, Intravenous,  Once, 1 of 2 cycles Administration: 920 mg (01/12/2020), 920 mg (01/26/2020) fosaprepitant (EMEND) 150 mg in sodium chloride 0.9 % 145 mL IVPB, 150 mg, Intravenous,  Once, 1 of 2 cycles Administration: 150 mg (01/12/2020), 150 mg (01/26/2020) vinBLAStine (VELBAN) 15 mg in sodium chloride 0.9 % 50 mL chemo infusion, 6.1 mg/m2 = 14.8 mg, Intravenous, Once, 1 of 2 cycles Administration: 15 mg (01/12/2020), 15 mg (01/26/2020)  for chemotherapy treatment.       CANCER STAGING: Cancer Staging Hodgkin's disease [facility]) Staging form: Hodgkin and Non-Hodgkin Lymphoma, AJCC 8th Edition - Clinical stage from 01/06/2020: Stage IV (Hodgkin lymphoma, A - Asymptomatic) - Unsigned    INTERVAL HISTORY:  Mr. Tapp 24 y.o. male seen for follow-up for day 15 cycle 1 of chemotherapy and toxicity assessment.  Appetite and energy levels are 100%.  Occasional headaches present.   Reported some cramping in the bilateral lower rib area on and off.  Today he does not have it.  Denies any nausea vomiting or diarrhea.  No low back pain this week.   REVIEW OF SYSTEMS:  Review of Systems  Respiratory: Positive for cough.   Neurological: Positive for headaches.  All other systems reviewed and are negative.    PAST MEDICAL/SURGICAL HISTORY:  Past Medical History:  Diagnosis Date  . ADD (attention deficit disorder)    Inattention type  . Cyclic vomiting syndrome 2009  . GERD (gastroesophageal reflux disease)   . Hypertension    Past Surgical History:  Procedure Laterality Date  . closed reduction rt. wrist    . IR IMAGING GUIDED PORT INSERTION  12/31/2019  . MASS BIOPSY Left 01/03/2020   Procedure: OPEN NECK BIOPSY;  Surgeon: Leta Baptist, MD;  Location: [facility];  Service: ENT;  Laterality: Left;     SOCIAL HISTORY:  Social History   Socioeconomic History  . Marital status: Single    Spouse name: Not on file  . Number of children: 0  . Years of education: Not on file  . Highest education level: Not on file  Occupational History  . Not on file  Tobacco Use  . Smoking status: Never Smoker  . Smokeless tobacco: Former Network engineer and Sexual Activity  . Alcohol use: Never  . Drug use: Never  . Sexual activity: Not Currently  Other Topics Concern  . Not on file  Social History Narrative  . Not on file   Social Determinants of Health  Financial Resource Strain: Low Risk   . Difficulty of Paying Living Expenses: Not hard at all  Food Insecurity: No Food Insecurity  . Worried About Charity fundraiser in the Last Year: Never true  . Ran Out of Food in the Last Year: Never true  Transportation Needs: No Transportation Needs  . Lack of Transportation (Medical): No  . Lack of Transportation (Non-Medical): No  Physical Activity: Inactive  . Days of Exercise per Week: 0 days  . Minutes of Exercise per Session: 0 min  Stress: No Stress  Concern Present  . Feeling of Stress : Not at all  Social Connections: Somewhat Isolated  . Frequency of Communication with Friends and Family: More than three times a week  . Frequency of Social Gatherings with Friends and Family: More than three times a week  . Attends Religious Services: More than 4 times per year  . Active Member of Clubs or Organizations: No  . Attends Archivist Meetings: Never  . Marital Status: Never married  Intimate Partner Violence: Not At Risk  . Fear of Current or Ex-Partner: No  . Emotionally Abused: No  . Physically Abused: No  . Sexually Abused: No    FAMILY HISTORY:  Family History  Problem Relation Age of Onset  . Cancer Paternal Grandmother   . Hypertension Mother   . Hypertension Father     CURRENT MEDICATIONS:  Outpatient Encounter Medications as of 01/26/2020  Medication Sig  . allopurinol (ZYLOPRIM) 300 MG tablet Take 1 tablet (300 mg total) by mouth daily.  Marland Kitchen amLODipine (NORVASC) 10 MG tablet Take 1 tablet (10 mg total) by mouth daily.  . bleomycin in sodium chloride 0.9 % 50 mL Inject into the vein every 14 (fourteen) days. Day 1, 15 every 28 days  . DACARBAZINE IV Inject into the vein every 14 (fourteen) days. Day 1, 15 every 28 days  . DOXORUBICIN HCL IV Inject into the vein every 14 (fourteen) days. Day 1, 15 every 28 days  . loratadine (CLARITIN) 10 MG tablet Take 10 mg by mouth daily.  Marland Kitchen omeprazole (PRILOSEC OTC) 20 MG tablet Take 1 tablet (20 mg total) by mouth daily.  . vinBLAStine in sodium chloride 0.9 % 50 mL Inject into the vein. Day 1, 15 every 28 days  . acetaminophen (TYLENOL) 325 MG tablet Take 650 mg by mouth 2 (two) times daily as needed for moderate pain or fever.  . lidocaine-prilocaine (EMLA) cream Apply small amount over port site and cover with plastic wrap 1 hour before appointment. (Patient not taking: Reported on 01/19/2020)  . prochlorperazine (COMPAZINE) 10 MG tablet Take 1 tablet (10 mg total) by mouth  every 6 (six) hours as needed (Nausea or vomiting). (Patient not taking: Reported on 01/19/2020)  . sodium chloride (OCEAN) 0.65 % SOLN nasal spray Place 1 spray into both nostrils as needed for congestion.   No facility-administered encounter medications on file as of 01/26/2020.    ALLERGIES:  Allergies  Allergen Reactions  . Ace Inhibitors Swelling     PHYSICAL EXAM:  ECOG Performance status: 0  Vitals:   01/26/20 0920  BP: (!) 141/73  Pulse: 83  Resp: 18  Temp: (!) 97.3 F (36.3 C)  SpO2: 98%   Filed Weights   01/26/20 0920  Weight: 256 lb 6.4 oz (116.3 kg)    Physical Exam Vitals reviewed.  Constitutional:      Appearance: Normal appearance.  Cardiovascular:     Rate and Rhythm: Normal  rate and regular rhythm.     Heart sounds: Normal heart sounds.  Pulmonary:     Effort: Pulmonary effort is normal.     Breath sounds: Normal breath sounds.  Abdominal:     General: There is no distension.     Palpations: Abdomen is soft. There is no mass.  Lymphadenopathy:     Cervical: Cervical adenopathy present.  Skin:    General: Skin is warm.  Neurological:     General: No focal deficit present.     Mental Status: He is alert and oriented to person, place, and time.  Psychiatric:        Mood and Affect: Mood normal.        Behavior: Behavior normal.    Left supraclavicular adenopathy has improved.  LABORATORY DATA:  I have reviewed the labs as listed.  CBC    Component Value Date/Time   WBC 11.1 (H) 01/26/2020 0836   RBC 4.07 (L) 01/26/2020 0836   HGB 11.5 (L) 01/26/2020 0836   HGB 12.9 (L) 12/24/2019 1556   HCT 36.1 (L) 01/26/2020 0836   HCT 38.8 12/24/2019 1556   PLT 174 01/26/2020 0836   PLT 400 12/24/2019 1556   MCV 88.7 01/26/2020 0836   MCV 84 12/24/2019 1556   MCH 28.3 01/26/2020 0836   MCHC 31.9 01/26/2020 0836   RDW 14.5 01/26/2020 0836   RDW 12.9 12/24/2019 1556   LYMPHSABS 2.3 01/26/2020 0836   LYMPHSABS 2.0 12/24/2019 1556   MONOABS 0.8  01/26/2020 0836   EOSABS 0.2 01/26/2020 0836   EOSABS 0.1 12/24/2019 1556   BASOSABS 0.1 01/26/2020 0836   BASOSABS 0.1 12/24/2019 1556   CMP Latest Ref Rng & Units 01/26/2020 01/19/2020 01/12/2020  Glucose 70 - 99 mg/dL 127(H) 80 148(H)  BUN 6 - 20 mg/dL 17 17 15   Creatinine 0.61 - 1.24 mg/dL 0.96 0.87 0.87  Sodium 135 - 145 mmol/L 138 137 138  Potassium 3.5 - 5.1 mmol/L 4.0 3.8 3.8  Chloride 98 - 111 mmol/L 103 100 102  CO2 22 - 32 mmol/L 27 28 25   Calcium 8.9 - 10.3 mg/dL 9.0 9.0 8.9  Total Protein 6.5 - 8.1 g/dL 7.2 7.2 7.5  Total Bilirubin 0.3 - 1.2 mg/dL 0.6 0.1(L) 0.2(L)  Alkaline Phos 38 - 126 U/L 106 103 84  AST 15 - 41 U/L 51(H) 19 14(L)  ALT 0 - 44 U/L 120(H) 63(H) 33       DIAGNOSTIC IMAGING:  I have reviewed scans.     ASSESSMENT & PLAN:   Hodgkin's disease ([place]) 1.  Stage IV AE classical Hodgkin's disease: -PET scan on 01/05/2020-hypermetabolic left cervical, supraclavicular, bilateral mediastinal adenopathy.  Hypermetabolic porta hepatic lymph nodes.  Mildly increased FDG uptake associated with spleen which is above liver activity.  Diffuse increased uptake in the axial and proximal appendicular skeleton. -IPI score of 3. -Cycle 1 of ABVD on 01/12/2020. -He had experienced back pain and hip pain during the first week, likely from growth factor. -In the last 1 week he did not experience any bone pains. -We have reviewed his labs.  Mildly elevated AST and ALT. -I would not dose reduce Adriamycin at this time.  We will proceed with cycle 1 day 15 today. -We will see him back in 2 weeks for follow-up.  2.  Tumor lysis prophylaxis: -Uric acid today is 5.3 and other electrolytes are normal. -He will continue allopurinol daily.  3.  Low back pain and hip pain: -He did get  some back pain from growth factors during the first week. -He will take Claritin.  He will use Percocet if needed.      Orders placed this encounter:  No orders of the defined types were  placed in this encounter.     Derek Jack, MD [place] [phone]

## 2020-01-26 NOTE — Progress Notes (Signed)
Labs reviewed with MD today. Proceed with treatment. Liver enzymes noted by MD. No change at this time per MD.   .Treatment given per orders. Patient tolerated it well without problems. Vitals stable and discharged home from clinic ambulatory. Follow up as scheduled.

## 2020-01-26 NOTE — Progress Notes (Signed)
Patient has been assessed, vital signs and labs have been reviewed by Dr. Katragadda. ANC, Creatinine, LFTs, and Platelets are within treatment parameters per Dr. Katragadda. The patient is good to proceed with treatment at this time.  

## 2020-01-26 NOTE — Patient Instructions (Signed)
[facility] Discharge Instructions for Patients Receiving Chemotherapy  Today you received the following chemotherapy agents   To help prevent nausea and vomiting after your treatment, we encourage you to take your nausea medication   If you develop nausea and vomiting that is not controlled by your nausea medication, call the clinic.   BELOW ARE SYMPTOMS THAT SHOULD BE REPORTED IMMEDIATELY:  *FEVER GREATER THAN 100.5 F  *CHILLS WITH OR WITHOUT FEVER  NAUSEA AND VOMITING THAT IS NOT CONTROLLED WITH YOUR NAUSEA MEDICATION  *UNUSUAL SHORTNESS OF BREATH  *UNUSUAL BRUISING OR BLEEDING  TENDERNESS IN MOUTH AND THROAT WITH OR WITHOUT PRESENCE OF ULCERS  *URINARY PROBLEMS  *BOWEL PROBLEMS  UNUSUAL RASH Items with * indicate a potential emergency and should be followed up as soon as possible.  Feel free to call the clinic should you have any questions or concerns. The clinic phone number is [phone].  Please show the CHEMO ALERT CARD at check-in to the Emergency Department and triage nurse.   

## 2020-01-26 NOTE — Assessment & Plan Note (Signed)
1.  Stage IV AE classical Hodgkin's disease: -PET scan on 01/05/2020-hypermetabolic left cervical, supraclavicular, bilateral mediastinal adenopathy.  Hypermetabolic porta hepatic lymph nodes.  Mildly increased FDG uptake associated with spleen which is above liver activity.  Diffuse increased uptake in the axial and proximal appendicular skeleton. -IPI score of 3. -Cycle 1 of ABVD on 01/12/2020. -He had experienced back pain and hip pain during the first week, likely from growth factor. -In the last 1 week he did not experience any bone pains. -We have reviewed his labs.  Mildly elevated AST and ALT. -I would not dose reduce Adriamycin at this time.  We will proceed with cycle 1 day 15 today. -We will see him back in 2 weeks for follow-up.  2.  Tumor lysis prophylaxis: -Uric acid today is 5.3 and other electrolytes are normal. -He will continue allopurinol daily.  3.  Low back pain and hip pain: -He did get some back pain from growth factors during the first week. -He will take Claritin.  He will use Percocet if needed.

## 2020-01-27 ENCOUNTER — Inpatient Hospital Stay (HOSPITAL_COMMUNITY): Payer: BC Managed Care – PPO | Attending: Hematology

## 2020-01-27 VITALS — BP 141/70 | HR 69 | Resp 17

## 2020-01-27 DIAGNOSIS — C8118 Nodular sclerosis classical Hodgkin lymphoma, lymph nodes of multiple sites: Secondary | ICD-10-CM

## 2020-01-27 DIAGNOSIS — Z5111 Encounter for antineoplastic chemotherapy: Secondary | ICD-10-CM | POA: Diagnosis not present

## 2020-01-27 DIAGNOSIS — Z5189 Encounter for other specified aftercare: Secondary | ICD-10-CM | POA: Insufficient documentation

## 2020-01-27 DIAGNOSIS — C8190 Hodgkin lymphoma, unspecified, unspecified site: Secondary | ICD-10-CM | POA: Insufficient documentation

## 2020-01-27 MED ORDER — PEGFILGRASTIM-JMDB 6 MG/0.6ML ~~LOC~~ SOSY
PREFILLED_SYRINGE | SUBCUTANEOUS | Status: AC
  Filled 2020-01-27: qty 0.6

## 2020-01-27 MED ORDER — PEGFILGRASTIM-JMDB 6 MG/0.6ML ~~LOC~~ SOSY
6.0000 mg | PREFILLED_SYRINGE | Freq: Once | SUBCUTANEOUS | Status: AC
  Administered 2020-01-27: 6 mg via SUBCUTANEOUS

## 2020-01-27 NOTE — Progress Notes (Signed)
Tyler [name] presents today for injection per MD orders. Fulphila administered SQ in left Abdomen. Administration without incident. Patient tolerated well. Vital signs stable. No complaints at this time. Discharged from clinic ambulatory. F/U with [facility] as scheduled.

## 2020-02-04 NOTE — Progress Notes (Signed)
.  Pharmacist Chemotherapy Monitoring - Follow Up Assessment    I verify that I have reviewed each item in the below checklist:  . Regimen for the patient is scheduled for the appropriate day and plan matches scheduled date. Marland Kitchen Appropriate non-routine labs are ordered dependent on drug ordered. . If applicable, additional medications reviewed and ordered per protocol based on lifetime cumulative doses and/or treatment regimen.   Plan for follow-up and/or issues identified: No . I-vent associated with next due treatment: no . MD and/or nursing notified: No  Tyler [name] 02/04/2020 1:41 PM

## 2020-02-09 ENCOUNTER — Inpatient Hospital Stay (HOSPITAL_BASED_OUTPATIENT_CLINIC_OR_DEPARTMENT_OTHER): Payer: BC Managed Care – PPO | Admitting: Hematology

## 2020-02-09 ENCOUNTER — Encounter (HOSPITAL_COMMUNITY): Payer: Self-pay | Admitting: Hematology

## 2020-02-09 ENCOUNTER — Inpatient Hospital Stay (HOSPITAL_COMMUNITY): Payer: BC Managed Care – PPO

## 2020-02-09 ENCOUNTER — Ambulatory Visit (HOSPITAL_COMMUNITY): Payer: BC Managed Care – PPO | Admitting: Hematology

## 2020-02-09 ENCOUNTER — Other Ambulatory Visit (HOSPITAL_COMMUNITY): Payer: BC Managed Care – PPO

## 2020-02-09 ENCOUNTER — Other Ambulatory Visit: Payer: Self-pay

## 2020-02-09 VITALS — BP 153/69 | HR 98 | Temp 96.9°F | Resp 18

## 2020-02-09 DIAGNOSIS — C8118 Nodular sclerosis classical Hodgkin lymphoma, lymph nodes of multiple sites: Secondary | ICD-10-CM

## 2020-02-09 DIAGNOSIS — Z5189 Encounter for other specified aftercare: Secondary | ICD-10-CM | POA: Diagnosis not present

## 2020-02-09 DIAGNOSIS — C8190 Hodgkin lymphoma, unspecified, unspecified site: Secondary | ICD-10-CM | POA: Diagnosis not present

## 2020-02-09 DIAGNOSIS — Z5111 Encounter for antineoplastic chemotherapy: Secondary | ICD-10-CM | POA: Diagnosis not present

## 2020-02-09 LAB — MAGNESIUM: Magnesium: 2.1 mg/dL (ref 1.7–2.4)

## 2020-02-09 LAB — COMPREHENSIVE METABOLIC PANEL
ALT: 44 U/L (ref 0–44)
AST: 17 U/L (ref 15–41)
Albumin: 4 g/dL (ref 3.5–5.0)
Alkaline Phosphatase: 100 U/L (ref 38–126)
Anion gap: 11 (ref 5–15)
BUN: 16 mg/dL (ref 6–20)
CO2: 24 mmol/L (ref 22–32)
Calcium: 8.9 mg/dL (ref 8.9–10.3)
Chloride: 101 mmol/L (ref 98–111)
Creatinine, Ser: 0.97 mg/dL (ref 0.61–1.24)
GFR calc Af Amer: >60 mL/min (ref >60)
GFR calc non Af Amer: >60 mL/min (ref >60)
Glucose, Bld: 144 mg/dL — ABNORMAL HIGH (ref 70–99)
Potassium: 3.8 mmol/L (ref 3.5–5.1)
Sodium: 136 mmol/L (ref 135–145)
Total Bilirubin: 0.4 mg/dL (ref 0.3–1.2)
Total Protein: 7.1 g/dL (ref 6.5–8.1)

## 2020-02-09 LAB — PHOSPHORUS: Phosphorus: 3.8 mg/dL (ref 2.5–4.6)

## 2020-02-09 LAB — CBC WITH DIFFERENTIAL/PLATELET
Abs Immature Granulocytes: 0.33 10*3/uL — ABNORMAL HIGH (ref 0.00–0.07)
Basophils Absolute: 0.1 10*3/uL (ref 0.0–0.1)
Basophils Relative: 1 %
Eosinophils Absolute: 0.1 10*3/uL (ref 0.0–0.5)
Eosinophils Relative: 2 %
HCT: 36.9 % — ABNORMAL LOW (ref 39.0–52.0)
Hemoglobin: 11.8 g/dL — ABNORMAL LOW (ref 13.0–17.0)
Immature Granulocytes: 4 %
Lymphocytes Relative: 23 %
Lymphs Abs: 2.1 10*3/uL (ref 0.7–4.0)
MCH: 28.2 pg (ref 26.0–34.0)
MCHC: 32 g/dL (ref 30.0–36.0)
MCV: 88.3 fL (ref 80.0–100.0)
Monocytes Absolute: 0.6 10*3/uL (ref 0.1–1.0)
Monocytes Relative: 6 %
Neutro Abs: 6 10*3/uL (ref 1.7–7.7)
Neutrophils Relative %: 64 %
Platelets: 291 10*3/uL (ref 150–400)
RBC: 4.18 MIL/uL — ABNORMAL LOW (ref 4.22–5.81)
RDW: 16.5 % — ABNORMAL HIGH (ref 11.5–15.5)
WBC: 9.2 10*3/uL (ref 4.0–10.5)
nRBC: 0 % (ref 0.0–0.2)

## 2020-02-09 LAB — URIC ACID: Uric Acid, Serum: 5.2 mg/dL (ref 3.7–8.6)

## 2020-02-09 MED ORDER — SODIUM CHLORIDE 0.9% FLUSH
10.0000 mL | INTRAVENOUS | Status: DC | PRN
  Administered 2020-02-09: 10 mL

## 2020-02-09 MED ORDER — VINBLASTINE SULFATE CHEMO INJECTION 1 MG/ML
6.1000 mg/m2 | Freq: Once | INTRAVENOUS | Status: AC
  Administered 2020-02-09: 15 mg via INTRAVENOUS
  Filled 2020-02-09: qty 15

## 2020-02-09 MED ORDER — HEPARIN SOD (PORK) LOCK FLUSH 100 UNIT/ML IV SOLN
500.0000 [IU] | Freq: Once | INTRAVENOUS | Status: AC | PRN
  Administered 2020-02-09: 500 [IU]

## 2020-02-09 MED ORDER — SODIUM CHLORIDE 0.9 % IV SOLN
375.0000 mg/m2 | Freq: Once | INTRAVENOUS | Status: AC
  Administered 2020-02-09: 920 mg via INTRAVENOUS
  Filled 2020-02-09: qty 92

## 2020-02-09 MED ORDER — SODIUM CHLORIDE 0.9 % IV SOLN
10.0000 mg | Freq: Once | INTRAVENOUS | Status: AC
  Administered 2020-02-09: 10 mg via INTRAVENOUS
  Filled 2020-02-09: qty 10

## 2020-02-09 MED ORDER — SODIUM CHLORIDE 0.9 % IV SOLN
150.0000 mg | Freq: Once | INTRAVENOUS | Status: AC
  Administered 2020-02-09: 150 mg via INTRAVENOUS
  Filled 2020-02-09: qty 150

## 2020-02-09 MED ORDER — PALONOSETRON HCL INJECTION 0.25 MG/5ML
0.2500 mg | Freq: Once | INTRAVENOUS | Status: AC
  Administered 2020-02-09: 0.25 mg via INTRAVENOUS

## 2020-02-09 MED ORDER — SODIUM CHLORIDE 0.9 % IV SOLN: Freq: Once | INTRAVENOUS | Status: AC

## 2020-02-09 MED ORDER — DOXORUBICIN HCL CHEMO IV INJECTION 2 MG/ML
25.0000 mg/m2 | Freq: Once | INTRAVENOUS | Status: AC
  Administered 2020-02-09: 62 mg via INTRAVENOUS
  Filled 2020-02-09: qty 31

## 2020-02-09 MED ORDER — PALONOSETRON HCL INJECTION 0.25 MG/5ML
INTRAVENOUS | Status: AC
  Filled 2020-02-09: qty 5

## 2020-02-09 MED ORDER — SODIUM CHLORIDE 0.9 % IV SOLN
10.0000 [IU]/m2 | Freq: Once | INTRAVENOUS | Status: AC
  Administered 2020-02-09: 25 [IU] via INTRAVENOUS
  Filled 2020-02-09: qty 8.33

## 2020-02-09 NOTE — Progress Notes (Signed)
Tyler [name],  [id number]   CLINIC:  Medical Oncology/Hematology  PCP:  Tyler [name], [place] [address] [phone]   REASON FOR VISIT:  Follow-up for Hodgkin's disease.  CURRENT THERAPY: ABVD.  BRIEF ONCOLOGIC HISTORY:  Oncology History  Hodgkin's disease ([place])  01/06/2020 Initial Diagnosis   Hodgkin's disease ([place])   01/12/2020 -  Chemotherapy   The patient had DOXOrubicin (ADRIAMYCIN) chemo injection 62 mg, 25 mg/m2 = 62 mg, Intravenous,  Once, 1 of 3 cycles Administration: 62 mg (01/12/2020), 62 mg (01/26/2020) palonosetron (ALOXI) injection 0.25 mg, 0.25 mg, Intravenous,  Once, 1 of 3 cycles Administration: 0.25 mg (01/12/2020), 0.25 mg (01/26/2020) bleomycin (BLEOCIN) 25 Units in sodium chloride 0.9 % 50 mL chemo infusion, 10 Units/m2 = 25 Units, Intravenous,  Once, 1 of 3 cycles Administration: 25 Units (01/12/2020), 25 Units (01/26/2020) dacarbazine (DTIC) 920 mg in sodium chloride 0.9 % 250 mL chemo infusion, 375 mg/m2 = 920 mg, Intravenous,  Once, 1 of 3 cycles Administration: 920 mg (01/12/2020), 920 mg (01/26/2020) fosaprepitant (EMEND) 150 mg in sodium chloride 0.9 % 145 mL IVPB, 150 mg, Intravenous,  Once, 1 of 3 cycles Administration: 150 mg (01/12/2020), 150 mg (01/26/2020) vinBLAStine (VELBAN) 15 mg in sodium chloride 0.9 % 50 mL chemo infusion, 6.1 mg/m2 = 14.8 mg, Intravenous, Once, 1 of 3 cycles Administration: 15 mg (01/12/2020), 15 mg (01/26/2020)  for chemotherapy treatment.       CANCER STAGING: Cancer Staging Hodgkin's disease [facility]) Staging form: Hodgkin and Non-Hodgkin Lymphoma, AJCC 8th Edition - Clinical stage from 01/06/2020: Stage IV (Hodgkin lymphoma, A - Asymptomatic) - Unsigned    INTERVAL HISTORY:  Tyler [name] 24 y.o. male seen for follow-up of cycle 2-day 1 of chemotherapy and toxicity assessment.  Appetite is 100%.  Energy levels are 75%.  Shaved his head as his hair  started falling off.  Has some dry cough on and off.  He thinks it is coming from allergies.  Denies any tingling or numbness in extremities.  Denies any fevers or chills.  He is taking Claritin.  He is continuing to work full-time.   REVIEW OF SYSTEMS:  Review of Systems  Respiratory: Positive for cough.   Neurological: Positive for headaches.  All other systems reviewed and are negative.    PAST MEDICAL/SURGICAL HISTORY:  Past Medical History:  Diagnosis Date  . ADD (attention deficit disorder)    Inattention type  . Cyclic vomiting syndrome 2009  . GERD (gastroesophageal reflux disease)   . Hypertension    Past Surgical History:  Procedure Laterality Date  . closed reduction rt. wrist    . IR IMAGING GUIDED PORT INSERTION  12/31/2019  . MASS BIOPSY Left 01/03/2020   Procedure: OPEN NECK BIOPSY;  Surgeon: Leta Baptist, MD;  Location: [facility];  Service: ENT;  Laterality: Left;     SOCIAL HISTORY:  Social History   Socioeconomic History  . Marital status: Single    Spouse name: Not on file  . Number of children: 0  . Years of education: Not on file  . Highest education level: Not on file  Occupational History  . Not on file  Tobacco Use  . Smoking status: Never Smoker  . Smokeless tobacco: Former Network engineer and Sexual Activity  . Alcohol use: Never  . Drug use: Never  . Sexual activity: Not Currently  Other Topics Concern  . Not on file  Social  History Narrative  . Not on file   Social Determinants of Health   Financial Resource Strain: Low Risk   . Difficulty of Paying Living Expenses: Not hard at all  Food Insecurity: No Food Insecurity  . Worried About Charity fundraiser in the Last Year: Never true  . Ran Out of Food in the Last Year: Never true  Transportation Needs: No Transportation Needs  . Lack of Transportation (Medical): No  . Lack of Transportation (Non-Medical): No  Physical Activity: Inactive  . Days of Exercise per Week: 0  days  . Minutes of Exercise per Session: 0 min  Stress: No Stress Concern Present  . Feeling of Stress : Not at all  Social Connections: Somewhat Isolated  . Frequency of Communication with Friends and Family: More than three times a week  . Frequency of Social Gatherings with Friends and Family: More than three times a week  . Attends Religious Services: More than 4 times per year  . Active Member of Clubs or Organizations: No  . Attends Archivist Meetings: Never  . Marital Status: Never married  Intimate Partner Violence: Not At Risk  . Fear of Current or Ex-Partner: No  . Emotionally Abused: No  . Physically Abused: No  . Sexually Abused: No    FAMILY HISTORY:  Family History  Problem Relation Age of Onset  . Cancer Paternal Grandmother   . Hypertension Mother   . Hypertension Father     CURRENT MEDICATIONS:  Outpatient Encounter Medications as of 02/09/2020  Medication Sig  . allopurinol (ZYLOPRIM) 300 MG tablet Take 1 tablet (300 mg total) by mouth daily.  Marland Kitchen amLODipine (NORVASC) 10 MG tablet Take 1 tablet (10 mg total) by mouth daily.  . bleomycin in sodium chloride 0.9 % 50 mL Inject into the vein every 14 (fourteen) days. Day 1, 15 every 28 days  . DACARBAZINE IV Inject into the vein every 14 (fourteen) days. Day 1, 15 every 28 days  . DOXORUBICIN HCL IV Inject into the vein every 14 (fourteen) days. Day 1, 15 every 28 days  . loratadine (CLARITIN) 10 MG tablet Take 10 mg by mouth daily.  Marland Kitchen omeprazole (PRILOSEC OTC) 20 MG tablet Take 1 tablet (20 mg total) by mouth daily.  . sodium chloride (OCEAN) 0.65 % SOLN nasal spray Place 1 spray into both nostrils as needed for congestion.  . vinBLAStine in sodium chloride 0.9 % 50 mL Inject into the vein. Day 1, 15 every 28 days  . acetaminophen (TYLENOL) 325 MG tablet Take 650 mg by mouth 2 (two) times daily as needed for moderate pain or fever.  . lidocaine-prilocaine (EMLA) cream Apply small amount over port site and  cover with plastic wrap 1 hour before appointment. (Patient not taking: Reported on 02/09/2020)  . prochlorperazine (COMPAZINE) 10 MG tablet Take 1 tablet (10 mg total) by mouth every 6 (six) hours as needed (Nausea or vomiting). (Patient not taking: Reported on 02/09/2020)   No facility-administered encounter medications on file as of 02/09/2020.    ALLERGIES:  Allergies  Allergen Reactions  . Ace Inhibitors Swelling     PHYSICAL EXAM:  ECOG Performance status: 0  Vitals:   02/09/20 0847  BP: (!) 157/76  Pulse: 90  Resp: 18  Temp: (!) 96.8 F (36 C)  SpO2: 99%   Filed Weights   02/09/20 0847  Weight: 260 lb 6.4 oz (118.1 kg)    Physical Exam Vitals reviewed.  Constitutional:  Appearance: Normal appearance.  Cardiovascular:     Rate and Rhythm: Normal rate and regular rhythm.     Heart sounds: Normal heart sounds.  Pulmonary:     Effort: Pulmonary effort is normal.     Breath sounds: Normal breath sounds.  Abdominal:     General: There is no distension.     Palpations: Abdomen is soft. There is no mass.  Lymphadenopathy:     Cervical: Cervical adenopathy present.  Skin:    General: Skin is warm.  Neurological:     General: No focal deficit present.     Mental Status: He is alert and oriented to person, place, and time.  Psychiatric:        Mood and Affect: Mood normal.        Behavior: Behavior normal.    Left supraclavicular lymph node swelling has improved.  LABORATORY DATA:  I have reviewed the labs as listed.  CBC    Component Value Date/Time   WBC 9.2 02/09/2020 0838   RBC 4.18 (L) 02/09/2020 0838   HGB 11.8 (L) 02/09/2020 0838   HGB 12.9 (L) 12/24/2019 1556   HCT 36.9 (L) 02/09/2020 0838   HCT 38.8 12/24/2019 1556   PLT 291 02/09/2020 0838   PLT 400 12/24/2019 1556   MCV 88.3 02/09/2020 0838   MCV 84 12/24/2019 1556   MCH 28.2 02/09/2020 0838   MCHC 32.0 02/09/2020 0838   RDW 16.5 (H) 02/09/2020 0838   RDW 12.9 12/24/2019 1556    LYMPHSABS 2.1 02/09/2020 0838   LYMPHSABS 2.0 12/24/2019 1556   MONOABS 0.6 02/09/2020 0838   EOSABS 0.1 02/09/2020 0838   EOSABS 0.1 12/24/2019 1556   BASOSABS 0.1 02/09/2020 0838   BASOSABS 0.1 12/24/2019 1556   CMP Latest Ref Rng & Units 02/09/2020 01/26/2020 01/19/2020  Glucose 70 - 99 mg/dL 144(H) 127(H) 80  BUN 6 - 20 mg/dL 16 17 17   Creatinine 0.61 - 1.24 mg/dL 0.97 0.96 0.87  Sodium 135 - 145 mmol/L 136 138 137  Potassium 3.5 - 5.1 mmol/L 3.8 4.0 3.8  Chloride 98 - 111 mmol/L 101 103 100  CO2 22 - 32 mmol/L 24 27 28   Calcium 8.9 - 10.3 mg/dL 8.9 9.0 9.0  Total Protein 6.5 - 8.1 g/dL 7.1 7.2 7.2  Total Bilirubin 0.3 - 1.2 mg/dL 0.4 0.6 0.1(L)  Alkaline Phos 38 - 126 U/L 100 106 103  AST 15 - 41 U/L 17 51(H) 19  ALT 0 - 44 U/L 44 120(H) 63(H)       DIAGNOSTIC IMAGING:  I have reviewed scans.     ASSESSMENT & PLAN:   Hodgkin's disease ([place]) 1.  Stage IV AE classical Hodgkin's disease: -PET scan on 01/05/2020-hypermetabolic left cervical, supraclavicular, bilateral mediastinal adenopathy.  Hypermetabolic porta hepatic lymph nodes.  Mildly increased FDG uptake associated with spleen which is above liver activity.  Diffuse increased uptake in the axial and proximal appendicular skeleton. -IPI score of 3. -Cycle 1 of ABVD on 01/12/2020. -He started falling off.  He shaved his head.  Denies any dyspnea on exertion. -I have reviewed his CBC which is adequate to proceed with cycle 2-day 1 today.  He will come back in 2 weeks for follow-up.  I plan to repeat PET scan after cycle 2-day 15.  2.  Tumor lysis prophylaxis: -He is continuing allopurinol daily.  Uric acid is 5.2.  Phosphate and other electrolytes are normal.  3.  Low back pain and hip pain: -He had some  pain in both hips from growth factors during the first week.  He is taking Claritin.  4.  Elevated liver enzymes: -His LFTs were elevated 2 weeks ago.  They have normalized today.      Orders placed this  encounter:  No orders of the defined types were placed in this encounter.     Derek Jack, MD [place] [phone]

## 2020-02-09 NOTE — Assessment & Plan Note (Addendum)
1.  Stage IV AE classical Hodgkin's disease: -PET scan on 01/05/2020-hypermetabolic left cervical, supraclavicular, bilateral mediastinal adenopathy.  Hypermetabolic porta hepatic lymph nodes.  Mildly increased FDG uptake associated with spleen which is above liver activity.  Diffuse increased uptake in the axial and proximal appendicular skeleton. -IPI score of 3. -Cycle 1 of ABVD on 01/12/2020. -He started falling off.  He shaved his head.  Denies any dyspnea on exertion. -I have reviewed his CBC which is adequate to proceed with cycle 2-day 1 today.  He will come back in 2 weeks for follow-up.  I plan to repeat PET scan after cycle 2-day 15.  2.  Tumor lysis prophylaxis: -He is continuing allopurinol daily.  Uric acid is 5.2.  Phosphate and other electrolytes are normal.  3.  Low back pain and hip pain: -He had some pain in both hips from growth factors during the first week.  He is taking Claritin.  4.  Elevated liver enzymes: -His LFTs were elevated 2 weeks ago.  They have normalized today.

## 2020-02-09 NOTE — Progress Notes (Signed)
Patient presents today for treatment and follow up visit with Dr. Delton Coombes. Labs pending. Patient denies any pain today. Patient states, he used more of the nausea medication at home after the last treatment. Patient states the nausea resolved with his medication. Vital signs within parameters for treatment. Patient denies any changes since his last visit.   Message received from [facility] LPN/ Dr. Delton Coombes. Proceed with treatment.    Treatment given today per MD orders. Tolerated infusion without adverse affects. Vital signs stable. No complaints at this time. Discharged from clinic ambulatory. F/U with [facility] as scheduled.

## 2020-02-09 NOTE — Patient Instructions (Addendum)
[facility] at [facility] Discharge Instructions  You were seen today by Dr. Katragadda. He went over your recent lab results. He will see you back in 2 weeks for labs, treatment and follow up.   Thank you for choosing [facility] at [facility] to provide your oncology and hematology care.  To afford each patient quality time with our provider, please arrive at least 15 minutes before your scheduled appointment time.   If you have a lab appointment with the Cancer Center please come in thru the  Main Entrance and check in at the main information desk  You need to re-schedule your appointment should you arrive 10 or more minutes late.  We strive to give you quality time with our providers, and arriving late affects you and other patients whose appointments are after yours.  Also, if you no show three or more times for appointments you may be dismissed from the clinic at the providers discretion.     Again, thank you for choosing [facility].  Our hope is that these requests will decrease the amount of time that you wait before being seen by our physicians.       _____________________________________________________________  Should you have questions after your visit to [facility], please contact our office at [phone] between the hours of 8:00 a.m. and 4:30 p.m.  Voicemails left after 4:00 p.m. will not be returned until the following business day.  For prescription refill requests, have your pharmacy contact our office and allow 72 hours.    Cancer Center Support Programs:   > Cancer Support Group  2nd Tuesday of the month 1pm-2pm, Journey Room    

## 2020-02-09 NOTE — Patient Instructions (Signed)
[facility] Discharge Instructions for Patients Receiving Chemotherapy  Today you received the following chemotherapy agents   To help prevent nausea and vomiting after your treatment, we encourage you to take your nausea medication   If you develop nausea and vomiting that is not controlled by your nausea medication, call the clinic.   BELOW ARE SYMPTOMS THAT SHOULD BE REPORTED IMMEDIATELY:  *FEVER GREATER THAN 100.5 F  *CHILLS WITH OR WITHOUT FEVER  NAUSEA AND VOMITING THAT IS NOT CONTROLLED WITH YOUR NAUSEA MEDICATION  *UNUSUAL SHORTNESS OF BREATH  *UNUSUAL BRUISING OR BLEEDING  TENDERNESS IN MOUTH AND THROAT WITH OR WITHOUT PRESENCE OF ULCERS  *URINARY PROBLEMS  *BOWEL PROBLEMS  UNUSUAL RASH Items with * indicate a potential emergency and should be followed up as soon as possible.  Feel free to call the clinic should you have any questions or concerns. The clinic phone number is [phone].  Please show the CHEMO ALERT CARD at check-in to the Emergency Department and triage nurse.   

## 2020-02-09 NOTE — Progress Notes (Signed)
Patient has been assessed, vital signs and labs have been reviewed by Dr. Katragadda. ANC, Creatinine, LFTs, and Platelets are within treatment parameters per Dr. Katragadda. The patient is good to proceed with treatment at this time.  

## 2020-02-10 ENCOUNTER — Encounter (HOSPITAL_COMMUNITY): Payer: Self-pay

## 2020-02-10 ENCOUNTER — Inpatient Hospital Stay (HOSPITAL_COMMUNITY): Payer: BC Managed Care – PPO

## 2020-02-10 VITALS — BP 163/73 | HR 96 | Temp 97.1°F | Resp 18

## 2020-02-10 DIAGNOSIS — C8190 Hodgkin lymphoma, unspecified, unspecified site: Secondary | ICD-10-CM | POA: Diagnosis not present

## 2020-02-10 DIAGNOSIS — Z5111 Encounter for antineoplastic chemotherapy: Secondary | ICD-10-CM | POA: Diagnosis not present

## 2020-02-10 DIAGNOSIS — C8118 Nodular sclerosis classical Hodgkin lymphoma, lymph nodes of multiple sites: Secondary | ICD-10-CM

## 2020-02-10 DIAGNOSIS — Z5189 Encounter for other specified aftercare: Secondary | ICD-10-CM | POA: Diagnosis not present

## 2020-02-10 MED ORDER — PEGFILGRASTIM-JMDB 6 MG/0.6ML ~~LOC~~ SOSY
6.0000 mg | PREFILLED_SYRINGE | Freq: Once | SUBCUTANEOUS | Status: AC
  Administered 2020-02-10: 6 mg via SUBCUTANEOUS

## 2020-02-10 NOTE — Progress Notes (Signed)
Tyler [name] tolerated Fulphila injection well without complaints or incident. VSS Pt discharged self ambulatory in satisfactory condition

## 2020-02-10 NOTE — Patient Instructions (Signed)
[facility] at [facility] Discharge Instructions  Received Fulphila injection today. Follow-up as scheduled. Call clinic for any questions or concerns   Thank you for choosing [facility] at [facility] to provide your oncology and hematology care.  To afford each patient quality time with our provider, please arrive at least 15 minutes before your scheduled appointment time.   If you have a lab appointment with the Cancer Center please come in thru the Main Entrance and check in at the main information desk.  You need to re-schedule your appointment should you arrive 10 or more minutes late.  We strive to give you quality time with our providers, and arriving late affects you and other patients whose appointments are after yours.  Also, if you no show three or more times for appointments you may be dismissed from the clinic at the providers discretion.     Again, thank you for choosing [facility].  Our hope is that these requests will decrease the amount of time that you wait before being seen by our physicians.       _____________________________________________________________  Should you have questions after your visit to [facility], please contact our office at [phone] between the hours of 8:00 a.m. and 4:30 p.m.  Voicemails left after 4:00 p.m. will not be returned until the following business day.  For prescription refill requests, have your pharmacy contact our office and allow 72 hours.    Due to Covid, you will need to wear a mask upon entering the hospital. If you do not have a mask, a mask will be given to you at the Main Entrance upon arrival. For doctor visits, patients may have 1 support person with them. For treatment visits, patients can not have anyone with them due to social distancing guidelines and our immunocompromised population.     

## 2020-02-22 NOTE — Progress Notes (Signed)
.  Pharmacist Chemotherapy Monitoring - Follow Up Assessment    I verify that I have reviewed each item in the below checklist:  . Regimen for the patient is scheduled for the appropriate day and plan matches scheduled date. Marland Kitchen Appropriate non-routine labs are ordered dependent on drug ordered. . If applicable, additional medications reviewed and ordered per protocol based on lifetime cumulative doses and/or treatment regimen.   Plan for follow-up and/or issues identified: No . I-vent associated with next due treatment: No  . MD and/or nursing notified: No  Tyler [name] 02/22/2020 2:24 PM

## 2020-02-23 ENCOUNTER — Inpatient Hospital Stay (HOSPITAL_COMMUNITY): Payer: BC Managed Care – PPO

## 2020-02-23 ENCOUNTER — Other Ambulatory Visit: Payer: Self-pay

## 2020-02-23 ENCOUNTER — Encounter (HOSPITAL_COMMUNITY): Payer: Self-pay | Admitting: Hematology

## 2020-02-23 ENCOUNTER — Inpatient Hospital Stay (HOSPITAL_BASED_OUTPATIENT_CLINIC_OR_DEPARTMENT_OTHER): Payer: BC Managed Care – PPO | Admitting: Hematology

## 2020-02-23 VITALS — BP 143/72 | HR 90 | Resp 16

## 2020-02-23 VITALS — BP 150/73 | HR 81 | Temp 96.8°F | Resp 18 | Wt 265.0 lb

## 2020-02-23 DIAGNOSIS — C8118 Nodular sclerosis classical Hodgkin lymphoma, lymph nodes of multiple sites: Secondary | ICD-10-CM

## 2020-02-23 DIAGNOSIS — Z5189 Encounter for other specified aftercare: Secondary | ICD-10-CM | POA: Diagnosis not present

## 2020-02-23 DIAGNOSIS — C8190 Hodgkin lymphoma, unspecified, unspecified site: Secondary | ICD-10-CM | POA: Diagnosis not present

## 2020-02-23 DIAGNOSIS — Z5111 Encounter for antineoplastic chemotherapy: Secondary | ICD-10-CM | POA: Diagnosis not present

## 2020-02-23 LAB — COMPREHENSIVE METABOLIC PANEL
ALT: 37 U/L (ref 0–44)
AST: 16 U/L (ref 15–41)
Albumin: 4 g/dL (ref 3.5–5.0)
Alkaline Phosphatase: 106 U/L (ref 38–126)
Anion gap: 10 (ref 5–15)
BUN: 15 mg/dL (ref 6–20)
CO2: 24 mmol/L (ref 22–32)
Calcium: 8.9 mg/dL (ref 8.9–10.3)
Chloride: 104 mmol/L (ref 98–111)
Creatinine, Ser: 0.95 mg/dL (ref 0.61–1.24)
GFR calc Af Amer: >60 mL/min (ref >60)
GFR calc non Af Amer: >60 mL/min (ref >60)
Glucose, Bld: 154 mg/dL — ABNORMAL HIGH (ref 70–99)
Potassium: 3.8 mmol/L (ref 3.5–5.1)
Sodium: 138 mmol/L (ref 135–145)
Total Bilirubin: 0.3 mg/dL (ref 0.3–1.2)
Total Protein: 7.1 g/dL (ref 6.5–8.1)

## 2020-02-23 LAB — PHOSPHORUS: Phosphorus: 4.2 mg/dL (ref 2.5–4.6)

## 2020-02-23 LAB — URIC ACID: Uric Acid, Serum: 4.9 mg/dL (ref 3.7–8.6)

## 2020-02-23 LAB — CBC WITH DIFFERENTIAL/PLATELET
Abs Immature Granulocytes: 0.54 10*3/uL — ABNORMAL HIGH (ref 0.00–0.07)
Basophils Absolute: 0.1 10*3/uL (ref 0.0–0.1)
Basophils Relative: 1 %
Eosinophils Absolute: 0.2 10*3/uL (ref 0.0–0.5)
Eosinophils Relative: 2 %
HCT: 36.8 % — ABNORMAL LOW (ref 39.0–52.0)
Hemoglobin: 11.7 g/dL — ABNORMAL LOW (ref 13.0–17.0)
Immature Granulocytes: 5 %
Lymphocytes Relative: 20 %
Lymphs Abs: 2.1 10*3/uL (ref 0.7–4.0)
MCH: 28.2 pg (ref 26.0–34.0)
MCHC: 31.8 g/dL (ref 30.0–36.0)
MCV: 88.7 fL (ref 80.0–100.0)
Monocytes Absolute: 0.6 10*3/uL (ref 0.1–1.0)
Monocytes Relative: 6 %
Neutro Abs: 6.7 10*3/uL (ref 1.7–7.7)
Neutrophils Relative %: 66 %
Platelets: 249 10*3/uL (ref 150–400)
RBC: 4.15 MIL/uL — ABNORMAL LOW (ref 4.22–5.81)
RDW: 17.7 % — ABNORMAL HIGH (ref 11.5–15.5)
WBC: 10.3 10*3/uL (ref 4.0–10.5)
nRBC: 0 % (ref 0.0–0.2)

## 2020-02-23 LAB — MAGNESIUM: Magnesium: 2 mg/dL (ref 1.7–2.4)

## 2020-02-23 MED ORDER — SODIUM CHLORIDE 0.9 % IV SOLN
10.0000 mg | Freq: Once | INTRAVENOUS | Status: AC
  Administered 2020-02-23: 10 mg via INTRAVENOUS
  Filled 2020-02-23: qty 10

## 2020-02-23 MED ORDER — SODIUM CHLORIDE 0.9 % IV SOLN: Freq: Once | INTRAVENOUS | Status: AC

## 2020-02-23 MED ORDER — VINBLASTINE SULFATE CHEMO INJECTION 1 MG/ML
6.1000 mg/m2 | Freq: Once | INTRAVENOUS | Status: AC
  Administered 2020-02-23: 15 mg via INTRAVENOUS
  Filled 2020-02-23: qty 15

## 2020-02-23 MED ORDER — SODIUM CHLORIDE 0.9 % IV SOLN
10.0000 [IU]/m2 | Freq: Once | INTRAVENOUS | Status: AC
  Administered 2020-02-23: 25 [IU] via INTRAVENOUS
  Filled 2020-02-23: qty 8.33

## 2020-02-23 MED ORDER — PALONOSETRON HCL INJECTION 0.25 MG/5ML
0.2500 mg | Freq: Once | INTRAVENOUS | Status: AC
  Administered 2020-02-23: 0.25 mg via INTRAVENOUS
  Filled 2020-02-23: qty 5

## 2020-02-23 MED ORDER — SODIUM CHLORIDE 0.9 % IV SOLN
150.0000 mg | Freq: Once | INTRAVENOUS | Status: AC
  Administered 2020-02-23: 150 mg via INTRAVENOUS
  Filled 2020-02-23: qty 150

## 2020-02-23 MED ORDER — SODIUM CHLORIDE 0.9 % IV SOLN
375.0000 mg/m2 | Freq: Once | INTRAVENOUS | Status: AC
  Administered 2020-02-23: 920 mg via INTRAVENOUS
  Filled 2020-02-23: qty 92

## 2020-02-23 MED ORDER — DOXORUBICIN HCL CHEMO IV INJECTION 2 MG/ML
25.0000 mg/m2 | Freq: Once | INTRAVENOUS | Status: AC
  Administered 2020-02-23: 62 mg via INTRAVENOUS
  Filled 2020-02-23: qty 31

## 2020-02-23 MED ORDER — SODIUM CHLORIDE 0.9% FLUSH
10.0000 mL | INTRAVENOUS | Status: DC | PRN
  Administered 2020-02-23: 10 mL

## 2020-02-23 MED ORDER — HEPARIN SOD (PORK) LOCK FLUSH 100 UNIT/ML IV SOLN
500.0000 [IU] | Freq: Once | INTRAVENOUS | Status: AC | PRN
  Administered 2020-02-23: 500 [IU]

## 2020-02-23 NOTE — Patient Instructions (Addendum)
[place] at [facility] Discharge Instructions  You were seen today by Dr. Delton Coombes. He went over your recent lab results. He will repeat your PET scan prior to your next visit. He will see you back in 2 weeks for labs, treatment and follow up.   Thank you for choosing [place] at [facility] to provide your oncology and hematology care.  To afford each patient quality time with our provider, please arrive at least 15 minutes before your scheduled appointment time.   If you have a lab appointment with the [facility] please come in thru the  Main Entrance and check in at the main information desk  You need to re-schedule your appointment should you arrive 10 or more minutes late.  We strive to give you quality time with our providers, and arriving late affects you and other patients whose appointments are after yours.  Also, if you no show three or more times for appointments you may be dismissed from the clinic at the providers discretion.     Again, thank you for choosing [facility].  Our hope is that these requests will decrease the amount of time that you wait before being seen by our physicians.       _____________________________________________________________  Should you have questions after your visit to [facility], please contact our office at [phone] between the hours of 8:00 a.m. and 4:30 p.m.  Voicemails left after 4:00 p.m. will not be returned until the following business day.  For prescription refill requests, have your pharmacy contact our office and allow 72 hours.    Cancer Center Support Programs:   > Cancer Support Group  2nd Tuesday of the month 1pm-2pm, Journey Room

## 2020-02-23 NOTE — Progress Notes (Signed)
[place],  [address]   CLINIC:  Medical Oncology/Hematology  PCP:  Mikey Kirschner, [place] [address] [phone]   REASON FOR VISIT:  Follow-up for Hodgkin's disease.  CURRENT THERAPY: ABVD.  BRIEF ONCOLOGIC HISTORY:  Oncology History  Hodgkin's disease ([place])  01/06/2020 Initial Diagnosis   Hodgkin's disease ([place])   01/12/2020 -  Chemotherapy   The patient had DOXOrubicin (ADRIAMYCIN) chemo injection 62 mg, 25 mg/m2 = 62 mg, Intravenous,  Once, 2 of 3 cycles Administration: 62 mg (01/12/2020), 62 mg (01/26/2020), 62 mg (02/09/2020), 62 mg (02/23/2020) palonosetron (ALOXI) injection 0.25 mg, 0.25 mg, Intravenous,  Once, 2 of 3 cycles Administration: 0.25 mg (01/12/2020), 0.25 mg (01/26/2020), 0.25 mg (02/09/2020), 0.25 mg (02/23/2020) bleomycin (BLEOCIN) 25 Units in sodium chloride 0.9 % 50 mL chemo infusion, 10 Units/m2 = 25 Units, Intravenous,  Once, 2 of 3 cycles Administration: 25 Units (01/12/2020), 25 Units (01/26/2020), 25 Units (02/09/2020), 25 Units (02/23/2020) dacarbazine (DTIC) 920 mg in sodium chloride 0.9 % 250 mL chemo infusion, 375 mg/m2 = 920 mg, Intravenous,  Once, 2 of 3 cycles Administration: 920 mg (01/12/2020), 920 mg (01/26/2020), 920 mg (02/09/2020), 920 mg (02/23/2020) fosaprepitant (EMEND) 150 mg in sodium chloride 0.9 % 145 mL IVPB, 150 mg, Intravenous,  Once, 2 of 3 cycles Administration: 150 mg (01/12/2020), 150 mg (01/26/2020), 150 mg (02/09/2020), 150 mg (02/23/2020) vinBLAStine (VELBAN) 15 mg in sodium chloride 0.9 % 50 mL chemo infusion, 6.1 mg/m2 = 14.8 mg, Intravenous, Once, 2 of 3 cycles Administration: 15 mg (01/12/2020), 15 mg (01/26/2020), 15 mg (02/09/2020), 15 mg (02/23/2020)  for chemotherapy treatment.       CANCER STAGING: Cancer Staging Hodgkin's disease [facility]) Staging form: Hodgkin and Non-Hodgkin Lymphoma, AJCC 8th Edition - Clinical stage from 01/06/2020: Stage IV (Hodgkin  lymphoma, A - Asymptomatic) - Unsigned    INTERVAL HISTORY:  Mr. Richel 24 y.o. male seen for follow-up and cycle 2-day 15 of chemotherapy and toxicity assessment.  Appetite is 100%.  Energy levels are 75%.  Numbness in the hands and feet which is transient was reported.  He also had some nausea and vomiting after last cycle of chemotherapy.  This was fairly well controlled with Compazine.  Denies any cough.  No major shortness of breath on exertion.   REVIEW OF SYSTEMS:  Review of Systems  Gastrointestinal: Positive for nausea.  Neurological: Positive for numbness.  All other systems reviewed and are negative.    PAST MEDICAL/SURGICAL HISTORY:  Past Medical History:  Diagnosis Date  . ADD (attention deficit disorder)    Inattention type  . Cyclic vomiting syndrome 2009  . GERD (gastroesophageal reflux disease)   . Hypertension    Past Surgical History:  Procedure Laterality Date  . closed reduction rt. wrist    . IR IMAGING GUIDED PORT INSERTION  12/31/2019  . MASS BIOPSY Left 01/03/2020   Procedure: OPEN NECK BIOPSY;  Surgeon: Leta Baptist, MD;  Location: [facility];  Service: ENT;  Laterality: Left;     SOCIAL HISTORY:  Social History   Socioeconomic History  . Marital status: Single    Spouse name: Not on file  . Number of children: 0  . Years of education: Not on file  . Highest education level: Not on file  Occupational History  . Not on file  Tobacco Use  . Smoking status: Never Smoker  . Smokeless tobacco: Former Network engineer and Sexual  Activity  . Alcohol use: Never  . Drug use: Never  . Sexual activity: Not Currently  Other Topics Concern  . Not on file  Social History Narrative  . Not on file   Social Determinants of Health   Financial Resource Strain: Low Risk   . Difficulty of Paying Living Expenses: Not hard at all  Food Insecurity: No Food Insecurity  . Worried About Charity fundraiser in the Last Year: Never true  . Ran Out of  Food in the Last Year: Never true  Transportation Needs: No Transportation Needs  . Lack of Transportation (Medical): No  . Lack of Transportation (Non-Medical): No  Physical Activity: Inactive  . Days of Exercise per Week: 0 days  . Minutes of Exercise per Session: 0 min  Stress: No Stress Concern Present  . Feeling of Stress : Not at all  Social Connections: Somewhat Isolated  . Frequency of Communication with Friends and Family: More than three times a week  . Frequency of Social Gatherings with Friends and Family: More than three times a week  . Attends Religious Services: More than 4 times per year  . Active Member of Clubs or Organizations: No  . Attends Archivist Meetings: Never  . Marital Status: Never married  Intimate Partner Violence: Not At Risk  . Fear of Current or Ex-Partner: No  . Emotionally Abused: No  . Physically Abused: No  . Sexually Abused: No    FAMILY HISTORY:  Family History  Problem Relation Age of Onset  . Cancer Paternal Grandmother   . Hypertension Mother   . Hypertension Father     CURRENT MEDICATIONS:  Outpatient Encounter Medications as of 02/23/2020  Medication Sig  . allopurinol (ZYLOPRIM) 300 MG tablet Take 1 tablet (300 mg total) by mouth daily.  Marland Kitchen amLODipine (NORVASC) 10 MG tablet Take 1 tablet (10 mg total) by mouth daily.  . bleomycin in sodium chloride 0.9 % 50 mL Inject into the vein every 14 (fourteen) days. Day 1, 15 every 28 days  . DACARBAZINE IV Inject into the vein every 14 (fourteen) days. Day 1, 15 every 28 days  . DOXORUBICIN HCL IV Inject into the vein every 14 (fourteen) days. Day 1, 15 every 28 days  . loratadine (CLARITIN) 10 MG tablet Take 10 mg by mouth daily.  Marland Kitchen omeprazole (PRILOSEC OTC) 20 MG tablet Take 1 tablet (20 mg total) by mouth daily.  . vinBLAStine in sodium chloride 0.9 % 50 mL Inject into the vein. Day 1, 15 every 28 days  . acetaminophen (TYLENOL) 325 MG tablet Take 650 mg by mouth 2 (two) times  daily as needed for moderate pain or fever.  . lidocaine-prilocaine (EMLA) cream Apply small amount over port site and cover with plastic wrap 1 hour before appointment. (Patient not taking: Reported on 02/09/2020)  . prochlorperazine (COMPAZINE) 10 MG tablet Take 1 tablet (10 mg total) by mouth every 6 (six) hours as needed (Nausea or vomiting). (Patient not taking: Reported on 02/09/2020)  . sodium chloride (OCEAN) 0.65 % SOLN nasal spray Place 1 spray into both nostrils as needed for congestion.   No facility-administered encounter medications on file as of 02/23/2020.    ALLERGIES:  Allergies  Allergen Reactions  . Ace Inhibitors Swelling     PHYSICAL EXAM:  ECOG Performance status: 0  Vitals:   02/23/20 0827  BP: (!) 150/73  Pulse: 81  Resp: 18  Temp: (!) 96.8 F (36 C)  SpO2: 99%  Filed Weights   02/23/20 0827  Weight: 265 lb (120.2 kg)    Physical Exam Vitals reviewed.  Constitutional:      Appearance: Normal appearance.  Cardiovascular:     Rate and Rhythm: Normal rate and regular rhythm.     Heart sounds: Normal heart sounds.  Pulmonary:     Effort: Pulmonary effort is normal.     Breath sounds: Normal breath sounds.  Abdominal:     General: There is no distension.     Palpations: Abdomen is soft. There is no mass.  Skin:    General: Skin is warm.  Neurological:     General: No focal deficit present.     Mental Status: He is alert and oriented to person, place, and time.  Psychiatric:        Mood and Affect: Mood normal.        Behavior: Behavior normal.    I could not palpate the left cervical lymph node anymore.  LABORATORY DATA:  I have reviewed the labs as listed.  CBC    Component Value Date/Time   WBC 10.3 02/23/2020 0837   RBC 4.15 (L) 02/23/2020 0837   HGB 11.7 (L) 02/23/2020 0837   HGB 12.9 (L) 12/24/2019 1556   HCT 36.8 (L) 02/23/2020 0837   HCT 38.8 12/24/2019 1556   PLT 249 02/23/2020 0837   PLT 400 12/24/2019 1556   MCV 88.7  02/23/2020 0837   MCV 84 12/24/2019 1556   MCH 28.2 02/23/2020 0837   MCHC 31.8 02/23/2020 0837   RDW 17.7 (H) 02/23/2020 0837   RDW 12.9 12/24/2019 1556   LYMPHSABS 2.1 02/23/2020 0837   LYMPHSABS 2.0 12/24/2019 1556   MONOABS 0.6 02/23/2020 0837   EOSABS 0.2 02/23/2020 0837   EOSABS 0.1 12/24/2019 1556   BASOSABS 0.1 02/23/2020 0837   BASOSABS 0.1 12/24/2019 1556   CMP Latest Ref Rng & Units 02/23/2020 02/09/2020 01/26/2020  Glucose 70 - 99 mg/dL 154(H) 144(H) 127(H)  BUN 6 - 20 mg/dL 15 16 17   Creatinine 0.61 - 1.24 mg/dL 0.95 0.97 0.96  Sodium 135 - 145 mmol/L 138 136 138  Potassium 3.5 - 5.1 mmol/L 3.8 3.8 4.0  Chloride 98 - 111 mmol/L 104 101 103  CO2 22 - 32 mmol/L 24 24 27   Calcium 8.9 - 10.3 mg/dL 8.9 8.9 9.0  Total Protein 6.5 - 8.1 g/dL 7.1 7.1 7.2  Total Bilirubin 0.3 - 1.2 mg/dL 0.3 0.4 0.6  Alkaline Phos 38 - 126 U/L 106 100 106  AST 15 - 41 U/L 16 17 51(H)  ALT 0 - 44 U/L 37 44 120(H)       DIAGNOSTIC IMAGING:  I have reviewed scans.     ASSESSMENT & PLAN:   Hodgkin's disease ([place]) 1.  Stage IV AE classical Hodgkin's disease: -PET scan on 01/05/2020-hypermetabolic left cervical, supraclavicular and bilateral mediastinal adenopathy.  Hypermetabolic porta hepatic lymph nodes.  Mildly increased FDG uptake associated with spleen which is about liver activity.  Diffuse increased uptake in the axial and proximal appendicular skeleton. -IPI score of 3. -Cycle 1 of ABVD on 01/12/2020, cycle 2 on 02/09/2020. -He tolerated last treatment reasonably well except on and off nausea which started 2 days after chemotherapy.  He vomited couple of times. -I have reviewed his labs.  White count is 10.3 with normal platelet count.  He will proceed with his cycle 2-day 15 treatment today. -I plan to schedule him for PET CT scan to evaluate response.  We  will also consider repeating PFTs after cycle 3.  2.  Tumor lysis prophylaxis: -Uric acid is 4.9.  Phosphate, magnesium and  potassium are normal.  He will continue allopurinol for 10 more days and stop it.  3.  Peripheral neuropathy: -He reported tingling in the hands if he sits still.  This typically lasts few seconds to less than a minute and goes away when he moves his hands.  He also has some tingling in his feet when he sits for long time. -We will closely watch it at this time.  4.  Nausea/vomiting: -He had on and off nausea started 2 days after chemotherapy.  He vomited 2-3 times. -His nausea is fairly controlled with Compazine.      Orders placed this encounter:  Orders Placed This Encounter  Procedures  . NM PET Image Restag (PS) Skull Base To Thigh      Derek Jack, MD [place] [phone]

## 2020-02-23 NOTE — Progress Notes (Signed)
Labs drawn from port without difficulty

## 2020-02-23 NOTE — Progress Notes (Signed)
Labs reviewed today with MD. Will proceed with treatment today per MD.   Treatment given per orders. Patient tolerated it well without problems. Vitals stable and discharged home from clinic ambulatory. Follow up as scheduled.  

## 2020-02-23 NOTE — Assessment & Plan Note (Signed)
1.  Stage IV AE classical Hodgkin's disease: -PET scan on 01/05/2020-hypermetabolic left cervical, supraclavicular and bilateral mediastinal adenopathy.  Hypermetabolic porta hepatic lymph nodes.  Mildly increased FDG uptake associated with spleen which is about liver activity.  Diffuse increased uptake in the axial and proximal appendicular skeleton. -IPI score of 3. -Cycle 1 of ABVD on 01/12/2020, cycle 2 on 02/09/2020. -He tolerated last treatment reasonably well except on and off nausea which started 2 days after chemotherapy.  He vomited couple of times. -I have reviewed his labs.  White count is 10.3 with normal platelet count.  He will proceed with his cycle 2-day 15 treatment today. -I plan to schedule him for PET CT scan to evaluate response.  We will also consider repeating PFTs after cycle 3.  2.  Tumor lysis prophylaxis: -Uric acid is 4.9.  Phosphate, magnesium and potassium are normal.  He will continue allopurinol for 10 more days and stop it.  3.  Peripheral neuropathy: -He reported tingling in the hands if he sits still.  This typically lasts few seconds to less than a minute and goes away when he moves his hands.  He also has some tingling in his feet when he sits for long time. -We will closely watch it at this time.  4.  Nausea/vomiting: -He had on and off nausea started 2 days after chemotherapy.  He vomited 2-3 times. -His nausea is fairly controlled with Compazine.

## 2020-02-23 NOTE — Progress Notes (Signed)
Patient has been assessed, vital signs and labs have been reviewed by Dr. Katragadda. ANC, Creatinine, LFTs, and Platelets are within treatment parameters per Dr. Katragadda. The patient is good to proceed with treatment at this time.  

## 2020-02-23 NOTE — Patient Instructions (Signed)
[facility] °Discharge Instructions for Patients Receiving Chemotherapy ° °Today you received the following chemotherapy agents  ° °To help prevent nausea and vomiting after your treatment, we encourage you to take your nausea medication  °  °If you develop nausea and vomiting that is not controlled by your nausea medication, call the clinic.  ° °BELOW ARE SYMPTOMS THAT SHOULD BE REPORTED IMMEDIATELY: °· *FEVER GREATER THAN 100.5 F °· *CHILLS WITH OR WITHOUT FEVER °· NAUSEA AND VOMITING THAT IS NOT CONTROLLED WITH YOUR NAUSEA MEDICATION °· *UNUSUAL SHORTNESS OF BREATH °· *UNUSUAL BRUISING OR BLEEDING °· TENDERNESS IN MOUTH AND THROAT WITH OR WITHOUT PRESENCE OF ULCERS °· *URINARY PROBLEMS °· *BOWEL PROBLEMS °· UNUSUAL RASH °Items with * indicate a potential emergency and should be followed up as soon as possible. ° °Feel free to call the clinic should you have any questions or concerns. The clinic phone number is [phone]. ° °Please show the CHEMO ALERT CARD at check-in to the Emergency Department and triage nurse. ° ° °

## 2020-02-24 ENCOUNTER — Inpatient Hospital Stay (HOSPITAL_COMMUNITY): Payer: BC Managed Care – PPO

## 2020-02-24 VITALS — BP 137/59 | HR 80 | Temp 97.7°F | Resp 18

## 2020-02-24 DIAGNOSIS — C8190 Hodgkin lymphoma, unspecified, unspecified site: Secondary | ICD-10-CM | POA: Diagnosis not present

## 2020-02-24 DIAGNOSIS — C8118 Nodular sclerosis classical Hodgkin lymphoma, lymph nodes of multiple sites: Secondary | ICD-10-CM

## 2020-02-24 DIAGNOSIS — Z5111 Encounter for antineoplastic chemotherapy: Secondary | ICD-10-CM | POA: Diagnosis not present

## 2020-02-24 DIAGNOSIS — Z5189 Encounter for other specified aftercare: Secondary | ICD-10-CM | POA: Diagnosis not present

## 2020-02-24 MED ORDER — PEGFILGRASTIM-JMDB 6 MG/0.6ML ~~LOC~~ SOSY
6.0000 mg | PREFILLED_SYRINGE | Freq: Once | SUBCUTANEOUS | Status: AC
  Administered 2020-02-24: 6 mg via SUBCUTANEOUS

## 2020-02-24 NOTE — Progress Notes (Signed)
Fulphila injection given per orders. Patient tolerated it well without problems. Vitals stable and discharged home from clinic ambulatory. Follow up as scheduled.  

## 2020-03-06 ENCOUNTER — Ambulatory Visit (HOSPITAL_COMMUNITY)
Admission: RE | Admit: 2020-03-06 | Discharge: 2020-03-06 | Disposition: A | Discharge status: Home / Self Care | Payer: BC Managed Care – PPO | Source: Ambulatory Visit | Attending: Hematology | Admitting: Hematology

## 2020-03-06 ENCOUNTER — Other Ambulatory Visit: Payer: Self-pay

## 2020-03-06 DIAGNOSIS — C8118 Nodular sclerosis classical Hodgkin lymphoma, lymph nodes of multiple sites: Secondary | ICD-10-CM | POA: Diagnosis not present

## 2020-03-06 DIAGNOSIS — C8111 Nodular sclerosis classical Hodgkin lymphoma, lymph nodes of head, face, and neck: Secondary | ICD-10-CM | POA: Diagnosis not present

## 2020-03-06 LAB — GLUCOSE, CAPILLARY: Glucose-Capillary: 93 mg/dL (ref 70–99)

## 2020-03-06 MED ORDER — FLUDEOXYGLUCOSE F - 18 (FDG) INJECTION
13.2600 | Freq: Once | INTRAVENOUS | Status: AC | PRN
  Administered 2020-03-06: 13.26 via INTRAVENOUS

## 2020-03-08 ENCOUNTER — Inpatient Hospital Stay (HOSPITAL_COMMUNITY): Payer: BC Managed Care – PPO | Attending: Hematology

## 2020-03-08 ENCOUNTER — Other Ambulatory Visit: Payer: Self-pay

## 2020-03-08 ENCOUNTER — Encounter (HOSPITAL_COMMUNITY): Payer: Self-pay | Admitting: Hematology

## 2020-03-08 ENCOUNTER — Inpatient Hospital Stay (HOSPITAL_COMMUNITY): Payer: BC Managed Care – PPO

## 2020-03-08 ENCOUNTER — Inpatient Hospital Stay (HOSPITAL_BASED_OUTPATIENT_CLINIC_OR_DEPARTMENT_OTHER): Payer: BC Managed Care – PPO | Admitting: Hematology

## 2020-03-08 VITALS — BP 144/67 | HR 82 | Temp 97.7°F | Resp 18

## 2020-03-08 DIAGNOSIS — C8190 Hodgkin lymphoma, unspecified, unspecified site: Secondary | ICD-10-CM | POA: Insufficient documentation

## 2020-03-08 DIAGNOSIS — C8118 Nodular sclerosis classical Hodgkin lymphoma, lymph nodes of multiple sites: Secondary | ICD-10-CM

## 2020-03-08 DIAGNOSIS — Z5189 Encounter for other specified aftercare: Secondary | ICD-10-CM | POA: Insufficient documentation

## 2020-03-08 DIAGNOSIS — Z5111 Encounter for antineoplastic chemotherapy: Secondary | ICD-10-CM | POA: Diagnosis not present

## 2020-03-08 LAB — MAGNESIUM: Magnesium: 2 mg/dL (ref 1.7–2.4)

## 2020-03-08 LAB — CBC WITH DIFFERENTIAL/PLATELET
Abs Immature Granulocytes: 0.22 10*3/uL — ABNORMAL HIGH (ref 0.00–0.07)
Basophils Absolute: 0.1 10*3/uL (ref 0.0–0.1)
Basophils Relative: 1 %
Eosinophils Absolute: 0.3 10*3/uL (ref 0.0–0.5)
Eosinophils Relative: 3 %
HCT: 36.4 % — ABNORMAL LOW (ref 39.0–52.0)
Hemoglobin: 11.6 g/dL — ABNORMAL LOW (ref 13.0–17.0)
Immature Granulocytes: 2 %
Lymphocytes Relative: 19 %
Lymphs Abs: 1.9 10*3/uL (ref 0.7–4.0)
MCH: 28.5 pg (ref 26.0–34.0)
MCHC: 31.9 g/dL (ref 30.0–36.0)
MCV: 89.4 fL (ref 80.0–100.0)
Monocytes Absolute: 0.5 10*3/uL (ref 0.1–1.0)
Monocytes Relative: 5 %
Neutro Abs: 6.8 10*3/uL (ref 1.7–7.7)
Neutrophils Relative %: 70 %
Platelets: 282 10*3/uL (ref 150–400)
RBC: 4.07 MIL/uL — ABNORMAL LOW (ref 4.22–5.81)
RDW: 18.3 % — ABNORMAL HIGH (ref 11.5–15.5)
WBC: 9.8 10*3/uL (ref 4.0–10.5)
nRBC: 0 % (ref 0.0–0.2)

## 2020-03-08 LAB — COMPREHENSIVE METABOLIC PANEL
ALT: 30 U/L (ref 0–44)
AST: 15 U/L (ref 15–41)
Albumin: 4 g/dL (ref 3.5–5.0)
Alkaline Phosphatase: 99 U/L (ref 38–126)
Anion gap: 8 (ref 5–15)
BUN: 19 mg/dL (ref 6–20)
CO2: 25 mmol/L (ref 22–32)
Calcium: 8.5 mg/dL — ABNORMAL LOW (ref 8.9–10.3)
Chloride: 104 mmol/L (ref 98–111)
Creatinine, Ser: 0.99 mg/dL (ref 0.61–1.24)
GFR calc Af Amer: >60 mL/min (ref >60)
GFR calc non Af Amer: >60 mL/min (ref >60)
Glucose, Bld: 156 mg/dL — ABNORMAL HIGH (ref 70–99)
Potassium: 4 mmol/L (ref 3.5–5.1)
Sodium: 137 mmol/L (ref 135–145)
Total Bilirubin: 0.4 mg/dL (ref 0.3–1.2)
Total Protein: 6.7 g/dL (ref 6.5–8.1)

## 2020-03-08 LAB — URIC ACID: Uric Acid, Serum: 5.5 mg/dL (ref 3.7–8.6)

## 2020-03-08 LAB — PHOSPHORUS: Phosphorus: 3.9 mg/dL (ref 2.5–4.6)

## 2020-03-08 MED ORDER — HEPARIN SOD (PORK) LOCK FLUSH 100 UNIT/ML IV SOLN
500.0000 [IU] | Freq: Once | INTRAVENOUS | Status: AC | PRN
  Administered 2020-03-08: 13:00:00 500 [IU]

## 2020-03-08 MED ORDER — VINBLASTINE SULFATE CHEMO INJECTION 1 MG/ML
6.1000 mg/m2 | Freq: Once | INTRAVENOUS | Status: AC
  Administered 2020-03-08: 11:00:00 15 mg via INTRAVENOUS
  Filled 2020-03-08: qty 15

## 2020-03-08 MED ORDER — SODIUM CHLORIDE 0.9 % IV SOLN
150.0000 mg | Freq: Once | INTRAVENOUS | Status: AC
  Administered 2020-03-08: 10:00:00 150 mg via INTRAVENOUS
  Filled 2020-03-08: qty 150

## 2020-03-08 MED ORDER — SODIUM CHLORIDE 0.9 % IV SOLN
375.0000 mg/m2 | Freq: Once | INTRAVENOUS | Status: AC
  Administered 2020-03-08: 12:00:00 920 mg via INTRAVENOUS
  Filled 2020-03-08: qty 92

## 2020-03-08 MED ORDER — SODIUM CHLORIDE 0.9 % IV SOLN: Freq: Once | INTRAVENOUS | Status: AC

## 2020-03-08 MED ORDER — SODIUM CHLORIDE 0.9% FLUSH
10.0000 mL | INTRAVENOUS | Status: DC | PRN
  Administered 2020-03-08: 09:00:00 10 mL

## 2020-03-08 MED ORDER — PALONOSETRON HCL INJECTION 0.25 MG/5ML
0.2500 mg | Freq: Once | INTRAVENOUS | Status: AC
  Administered 2020-03-08: 10:00:00 0.25 mg via INTRAVENOUS
  Filled 2020-03-08: qty 5

## 2020-03-08 MED ORDER — SODIUM CHLORIDE 0.9 % IV SOLN
10.0000 mg | Freq: Once | INTRAVENOUS | Status: AC
  Administered 2020-03-08: 10:00:00 10 mg via INTRAVENOUS
  Filled 2020-03-08: qty 10

## 2020-03-08 MED ORDER — SODIUM CHLORIDE 0.9 % IV SOLN
10.0000 [IU]/m2 | Freq: Once | INTRAVENOUS | Status: AC
  Administered 2020-03-08: 11:00:00 25 [IU] via INTRAVENOUS
  Filled 2020-03-08: qty 8.33

## 2020-03-08 MED ORDER — SODIUM CHLORIDE 0.9 % IV SOLN: INTRAVENOUS | Status: DC

## 2020-03-08 MED ORDER — DOXORUBICIN HCL CHEMO IV INJECTION 2 MG/ML
25.0000 mg/m2 | Freq: Once | INTRAVENOUS | Status: AC
  Administered 2020-03-08: 11:00:00 62 mg via INTRAVENOUS
  Filled 2020-03-08: qty 31

## 2020-03-08 NOTE — Assessment & Plan Note (Addendum)
1.  Stage IV AE classical Hodgkin's disease: -2 cycles of ABVD on 01/12/2020 and 02/09/2020. -We reviewed results of the PET scan on 03/06/2020.  Left lower neck lymph node decreased in size and metabolic.  It measures 2 cm, previously 3.7 cm with SUV 1.9, previously 14.5.  There is 1.6 cm left supraclavicular node with SUV 3.0, previously 2.6 cm with SUV 9.8.  Left prevascular lymph node decreased to 1.9 cm from 5.3 cm.  Right prevascular node decreased to 0.8 cm, previously 2.3 cm.  Borderline prominent 0.9 cm right external iliac lymph node with mild hypermetabolic with maximum SUV 3.5, new. -There is skin thickening with mild erythema in the left groin fold.  Denies any skin lesions on the abdominal wall or in the groin region.  I think the right external iliac lymph node is reactive. -I have recommended continuing ABVD.  I will continue bleomycin at this time.  I plan to repeat PET scan after cycle 4. -I have reviewed his labs which are within normal limits.  He will proceed with cycle 3-day 1 today.  He usually feels tired on Friday and sleeps a lot.  2.  Tumor lysis prophylaxis: -He stopped taking allopurinol 2 days ago as he completed the bottle.  Uric acid today is 5.5.  3.  Peripheral neuropathy: -Occasional numbness in the fingers 4 and 5 of the left hand when he is driving or sitting still for long time.  This gets better upon movement within a few seconds to minutes.  Denies any constant numbness.  We will closely watched at this time.  4.  Nausea/vomiting: -He has usually nausea and vomiting day 2 and 3 after chemotherapy. -I have suggested him to take Compazine in the mornings as a standing dose on those 2 days and the in the afternoon if he does not feel too drowsy.

## 2020-03-08 NOTE — Progress Notes (Signed)
[facility] [place], [place] [address]   CLINIC:  Medical Oncology/Hematology  PCP:  Mikey Kirschner, [place] [id number] [phone]   REASON FOR VISIT:  Follow-up for Hodgkin's disease.  CURRENT THERAPY: ABVD.  BRIEF ONCOLOGIC HISTORY:  Oncology History  Hodgkin's disease ([place])  01/06/2020 Initial Diagnosis   Hodgkin's disease ([place])   01/12/2020 -  Chemotherapy   The patient had DOXOrubicin (ADRIAMYCIN) chemo injection 62 mg, 25 mg/m2 = 62 mg, Intravenous,  Once, 3 of 6 cycles Administration: 62 mg (01/12/2020), 62 mg (01/26/2020), 62 mg (02/09/2020), 62 mg (02/23/2020) palonosetron (ALOXI) injection 0.25 mg, 0.25 mg, Intravenous,  Once, 3 of 6 cycles Administration: 0.25 mg (01/12/2020), 0.25 mg (01/26/2020), 0.25 mg (02/09/2020), 0.25 mg (02/23/2020) bleomycin (BLEOCIN) 25 Units in sodium chloride 0.9 % 50 mL chemo infusion, 10 Units/m2 = 25 Units, Intravenous,  Once, 3 of 6 cycles Administration: 25 Units (01/12/2020), 25 Units (01/26/2020), 25 Units (02/09/2020), 25 Units (02/23/2020) dacarbazine (DTIC) 920 mg in sodium chloride 0.9 % 250 mL chemo infusion, 375 mg/m2 = 920 mg, Intravenous,  Once, 3 of 6 cycles Administration: 920 mg (01/12/2020), 920 mg (01/26/2020), 920 mg (02/09/2020), 920 mg (02/23/2020) fosaprepitant (EMEND) 150 mg in sodium chloride 0.9 % 145 mL IVPB, 150 mg, Intravenous,  Once, 3 of 6 cycles Administration: 150 mg (01/12/2020), 150 mg (01/26/2020), 150 mg (02/09/2020), 150 mg (02/23/2020) vinBLAStine (VELBAN) 15 mg in sodium chloride 0.9 % 50 mL chemo infusion, 6.1 mg/m2 = 14.8 mg, Intravenous, Once, 3 of 6 cycles Administration: 15 mg (01/12/2020), 15 mg (01/26/2020), 15 mg (02/09/2020), 15 mg (02/23/2020)  for chemotherapy treatment.       CANCER STAGING: Cancer Staging Hodgkin's disease [facility]) Staging form: Hodgkin and Non-Hodgkin Lymphoma, AJCC 8th Edition - Clinical stage from 01/06/2020: Stage IV (Hodgkin  lymphoma, A - Asymptomatic) - Unsigned    INTERVAL HISTORY:  Tyler [name] 24 y.o. male seen for follow-up of Hodgkin's disease and Dr. Marta Antu prior to cycle 3 of chemotherapy.  Appetite is not percent.  Energy levels are 75%.  Numbness in the fingers and feet has been stable.  No constant numbness reported.  He has nausea and vomiting 1 to 2 days after each treatment.  He takes the Compazine but usually vomits it.  No fevers or chills reported.   REVIEW OF SYSTEMS:  Review of Systems  Gastrointestinal: Positive for nausea and vomiting.  Neurological: Positive for numbness.  All other systems reviewed and are negative.    PAST MEDICAL/SURGICAL HISTORY:  Past Medical History:  Diagnosis Date  . ADD (attention deficit disorder)    Inattention type  . Cyclic vomiting syndrome 2009  . GERD (gastroesophageal reflux disease)   . Hypertension    Past Surgical History:  Procedure Laterality Date  . closed reduction rt. wrist    . IR IMAGING GUIDED PORT INSERTION  12/31/2019  . MASS BIOPSY Left 01/03/2020   Procedure: OPEN NECK BIOPSY;  Surgeon: Leta Baptist, MD;  Location: [facility];  Service: ENT;  Laterality: Left;     SOCIAL HISTORY:  Social History   Socioeconomic History  . Marital status: Single    Spouse name: Not on file  . Number of children: 0  . Years of education: Not on file  . Highest education level: Not on file  Occupational History  . Not on file  Tobacco Use  . Smoking status: Never Smoker  . Smokeless tobacco: Former Systems developer  Substance and Sexual Activity  . Alcohol use: Never  . Drug use: Never  . Sexual activity: Not Currently  Other Topics Concern  . Not on file  Social History Narrative  . Not on file   Social Determinants of Health   Financial Resource Strain: Low Risk   . Difficulty of Paying Living Expenses: Not hard at all  Food Insecurity: No Food Insecurity  . Worried About Charity fundraiser in the Last Year: Never true  . Ran  Out of Food in the Last Year: Never true  Transportation Needs: No Transportation Needs  . Lack of Transportation (Medical): No  . Lack of Transportation (Non-Medical): No  Physical Activity: Inactive  . Days of Exercise per Week: 0 days  . Minutes of Exercise per Session: 0 min  Stress: No Stress Concern Present  . Feeling of Stress : Not at all  Social Connections: Somewhat Isolated  . Frequency of Communication with Friends and Family: More than three times a week  . Frequency of Social Gatherings with Friends and Family: More than three times a week  . Attends Religious Services: More than 4 times per year  . Active Member of Clubs or Organizations: No  . Attends Archivist Meetings: Never  . Marital Status: Never married  Intimate Partner Violence: Not At Risk  . Fear of Current or Ex-Partner: No  . Emotionally Abused: No  . Physically Abused: No  . Sexually Abused: No    FAMILY HISTORY:  Family History  Problem Relation Age of Onset  . Cancer Paternal Grandmother   . Hypertension Mother   . Hypertension Father     CURRENT MEDICATIONS:  Outpatient Encounter Medications as of 03/08/2020  Medication Sig  . amLODipine (NORVASC) 10 MG tablet Take 1 tablet (10 mg total) by mouth daily.  . bleomycin in sodium chloride 0.9 % 50 mL Inject into the vein every 14 (fourteen) days. Day 1, 15 every 28 days  . DACARBAZINE IV Inject into the vein every 14 (fourteen) days. Day 1, 15 every 28 days  . DOXORUBICIN HCL IV Inject into the vein every 14 (fourteen) days. Day 1, 15 every 28 days  . loratadine (CLARITIN) 10 MG tablet Take 10 mg by mouth daily.  Marland Kitchen omeprazole (PRILOSEC OTC) 20 MG tablet Take 1 tablet (20 mg total) by mouth daily.  . vinBLAStine in sodium chloride 0.9 % 50 mL Inject into the vein. Day 1, 15 every 28 days  . acetaminophen (TYLENOL) 325 MG tablet Take 650 mg by mouth 2 (two) times daily as needed for moderate pain or fever.  . lidocaine-prilocaine (EMLA)  cream Apply small amount over port site and cover with plastic wrap 1 hour before appointment. (Patient not taking: Reported on 02/09/2020)  . prochlorperazine (COMPAZINE) 10 MG tablet Take 1 tablet (10 mg total) by mouth every 6 (six) hours as needed (Nausea or vomiting). (Patient not taking: Reported on 02/09/2020)  . sodium chloride (OCEAN) 0.65 % SOLN nasal spray Place 1 spray into both nostrils as needed for congestion.  . [DISCONTINUED] allopurinol (ZYLOPRIM) 300 MG tablet Take 1 tablet (300 mg total) by mouth daily.   No facility-administered encounter medications on file as of 03/08/2020.    ALLERGIES:  Allergies  Allergen Reactions  . Ace Inhibitors Swelling     PHYSICAL EXAM:  ECOG Performance status: 0  Vitals:   03/08/20 0825  BP: (!) 144/75  Pulse: 78  Resp: 18  Temp: (!) 96.6 F (35.9  C)  SpO2: 100%   Filed Weights   03/08/20 0825  Weight: 268 lb 3.2 oz (121.7 kg)    Physical Exam Vitals reviewed.  Constitutional:      Appearance: Normal appearance.  Cardiovascular:     Rate and Rhythm: Normal rate and regular rhythm.     Heart sounds: Normal heart sounds.  Pulmonary:     Effort: Pulmonary effort is normal.     Breath sounds: Normal breath sounds.  Abdominal:     General: There is no distension.     Palpations: Abdomen is soft. There is no mass.  Skin:    General: Skin is warm.  Neurological:     General: No focal deficit present.     Mental Status: He is alert and oriented to person, place, and time.  Psychiatric:        Mood and Affect: Mood normal.        Behavior: Behavior normal.      LABORATORY DATA:  I have reviewed the labs as listed.  CBC    Component Value Date/Time   WBC 9.8 03/08/2020 0840   RBC 4.07 (L) 03/08/2020 0840   HGB 11.6 (L) 03/08/2020 0840   HGB 12.9 (L) 12/24/2019 1556   HCT 36.4 (L) 03/08/2020 0840   HCT 38.8 12/24/2019 1556   PLT 282 03/08/2020 0840   PLT 400 12/24/2019 1556   MCV 89.4 03/08/2020 0840   MCV 84  12/24/2019 1556   MCH 28.5 03/08/2020 0840   MCHC 31.9 03/08/2020 0840   RDW 18.3 (H) 03/08/2020 0840   RDW 12.9 12/24/2019 1556   LYMPHSABS 1.9 03/08/2020 0840   LYMPHSABS 2.0 12/24/2019 1556   MONOABS 0.5 03/08/2020 0840   EOSABS 0.3 03/08/2020 0840   EOSABS 0.1 12/24/2019 1556   BASOSABS 0.1 03/08/2020 0840   BASOSABS 0.1 12/24/2019 1556   CMP Latest Ref Rng & Units 03/08/2020 02/23/2020 02/09/2020  Glucose 70 - 99 mg/dL 156(H) 154(H) 144(H)  BUN 6 - 20 mg/dL 19 15 16   Creatinine 0.61 - 1.24 mg/dL 0.99 0.95 0.97  Sodium 135 - 145 mmol/L 137 138 136  Potassium 3.5 - 5.1 mmol/L 4.0 3.8 3.8  Chloride 98 - 111 mmol/L 104 104 101  CO2 22 - 32 mmol/L 25 24 24   Calcium 8.9 - 10.3 mg/dL 8.5(L) 8.9 8.9  Total Protein 6.5 - 8.1 g/dL 6.7 7.1 7.1  Total Bilirubin 0.3 - 1.2 mg/dL 0.4 0.3 0.4  Alkaline Phos 38 - 126 U/L 99 106 100  AST 15 - 41 U/L 15 16 17   ALT 0 - 44 U/L 30 37 44       DIAGNOSTIC IMAGING:  I have independently reviewed the scans with the patient.     ASSESSMENT & PLAN:   Hodgkin's disease ([place]) 1.  Stage IV AE classical Hodgkin's disease: -2 cycles of ABVD on 01/12/2020 and 02/09/2020. -We reviewed results of the PET scan on 03/06/2020.  Left lower neck lymph node decreased in size and metabolic.  It measures 2 cm, previously 3.7 cm with SUV 1.9, previously 14.5.  There is 1.6 cm left supraclavicular node with SUV 3.0, previously 2.6 cm with SUV 9.8.  Left prevascular lymph node decreased to 1.9 cm from 5.3 cm.  Right prevascular node decreased to 0.8 cm, previously 2.3 cm.  Borderline prominent 0.9 cm right external iliac lymph node with mild hypermetabolic with maximum SUV 3.5, new. -There is skin thickening with mild erythema in the left groin fold.  Denies  any skin lesions on the abdominal wall or in the groin region.  I think the right external iliac lymph node is reactive. -I have recommended continuing ABVD.  I will continue bleomycin at this time.  I plan to  repeat PET scan after cycle 4. -I have reviewed his labs which are within normal limits.  He will proceed with cycle 3-day 1 today.  He usually feels tired on Friday and sleeps a lot.  2.  Tumor lysis prophylaxis: -He stopped taking allopurinol 2 days ago as he completed the bottle.  Uric acid today is 5.5.  3.  Peripheral neuropathy: -Occasional numbness in the fingers 4 and 5 of the left hand when he is driving or sitting still for long time.  This gets better upon movement within a few seconds to minutes.  Denies any constant numbness.  We will closely watched at this time.  4.  Nausea/vomiting: -He has usually nausea and vomiting day 2 and 3 after chemotherapy. -I have suggested him to take Compazine in the mornings as a standing dose on those 2 days and the in the afternoon if he does not feel too drowsy.      Orders placed this encounter:  No orders of the defined types were placed in this encounter.     Derek Jack, MD [place] [phone]

## 2020-03-08 NOTE — Patient Instructions (Signed)
[facility] Discharge Instructions for Patients Receiving Chemotherapy  Today you received the following chemotherapy agents   To help prevent nausea and vomiting after your treatment, we encourage you to take your nausea medication   If you develop nausea and vomiting that is not controlled by your nausea medication, call the clinic.   BELOW ARE SYMPTOMS THAT SHOULD BE REPORTED IMMEDIATELY:  *FEVER GREATER THAN 100.5 F  *CHILLS WITH OR WITHOUT FEVER  NAUSEA AND VOMITING THAT IS NOT CONTROLLED WITH YOUR NAUSEA MEDICATION  *UNUSUAL SHORTNESS OF BREATH  *UNUSUAL BRUISING OR BLEEDING  TENDERNESS IN MOUTH AND THROAT WITH OR WITHOUT PRESENCE OF ULCERS  *URINARY PROBLEMS  *BOWEL PROBLEMS  UNUSUAL RASH Items with * indicate a potential emergency and should be followed up as soon as possible.  Feel free to call the clinic should you have any questions or concerns. The clinic phone number is [phone].  Please show the CHEMO ALERT CARD at check-in to the Emergency Department and triage nurse.   

## 2020-03-08 NOTE — Patient Instructions (Signed)
[facility] at [facility] Discharge Instructions  Labs drawn from portacath today   Thank you for choosing [facility] at [facility] to provide your oncology and hematology care.  To afford each patient quality time with our provider, please arrive at least 15 minutes before your scheduled appointment time.   If you have a lab appointment with the Cancer Center please come in thru the Main Entrance and check in at the main information desk.  You need to re-schedule your appointment should you arrive 10 or more minutes late.  We strive to give you quality time with our providers, and arriving late affects you and other patients whose appointments are after yours.  Also, if you no show three or more times for appointments you may be dismissed from the clinic at the providers discretion.     Again, thank you for choosing [facility].  Our hope is that these requests will decrease the amount of time that you wait before being seen by our physicians.       _____________________________________________________________  Should you have questions after your visit to [facility], please contact our office at [phone] between the hours of 8:00 a.m. and 4:30 p.m.  Voicemails left after 4:00 p.m. will not be returned until the following business day.  For prescription refill requests, have your pharmacy contact our office and allow 72 hours.    Due to Covid, you will need to wear a mask upon entering the hospital. If you do not have a mask, a mask will be given to you at the Main Entrance upon arrival. For doctor visits, patients may have 1 support person with them. For treatment visits, patients can not have anyone with them due to social distancing guidelines and our immunocompromised population.     

## 2020-03-08 NOTE — Progress Notes (Signed)
Patient has been assessed, vital signs and labs have been reviewed by Dr. Katragadda. ANC, Creatinine, LFTs, and Platelets are within treatment parameters per Dr. Katragadda. The patient is good to proceed with treatment at this time.  

## 2020-03-08 NOTE — Progress Notes (Signed)
Labs reviewed with MD today. Proceed with treatment as planned.   Treatment given per orders. Patient tolerated it well without problems. Vitals stable and discharged home from clinic ambulatory. Follow up as scheduled.  

## 2020-03-08 NOTE — Patient Instructions (Addendum)
[place] at [facility] Discharge Instructions  You were seen today by Dr. Delton Coombes. He went over your recent lab and scan results. Your scan looks good except for a new area found in the groin area. Take the nausea medication 2-3 times a day on Thursday and Friday. He will see you back in 2 weeks for labs, treatment and follow up.   Thank you for choosing [place] at [facility] to provide your oncology and hematology care.  To afford each patient quality time with our provider, please arrive at least 15 minutes before your scheduled appointment time.   If you have a lab appointment with the [facility] please come in thru the  Main Entrance and check in at the main information desk  You need to re-schedule your appointment should you arrive 10 or more minutes late.  We strive to give you quality time with our providers, and arriving late affects you and other patients whose appointments are after yours.  Also, if you no show three or more times for appointments you may be dismissed from the clinic at the providers discretion.     Again, thank you for choosing [facility].  Our hope is that these requests will decrease the amount of time that you wait before being seen by our physicians.       _____________________________________________________________  Should you have questions after your visit to [facility], please contact our office at [phone] between the hours of 8:00 a.m. and 4:30 p.m.  Voicemails left after 4:00 p.m. will not be returned until the following business day.  For prescription refill requests, have your pharmacy contact our office and allow 72 hours.    Cancer Center Support Programs:   > Cancer Support Group  2nd Tuesday of the month 1pm-2pm, Journey Room

## 2020-03-09 ENCOUNTER — Inpatient Hospital Stay (HOSPITAL_COMMUNITY): Payer: BC Managed Care – PPO

## 2020-03-09 VITALS — BP 140/73 | HR 84 | Temp 97.3°F | Resp 18

## 2020-03-09 DIAGNOSIS — Z5189 Encounter for other specified aftercare: Secondary | ICD-10-CM | POA: Diagnosis not present

## 2020-03-09 DIAGNOSIS — Z5111 Encounter for antineoplastic chemotherapy: Secondary | ICD-10-CM | POA: Diagnosis not present

## 2020-03-09 DIAGNOSIS — C8118 Nodular sclerosis classical Hodgkin lymphoma, lymph nodes of multiple sites: Secondary | ICD-10-CM

## 2020-03-09 DIAGNOSIS — C8190 Hodgkin lymphoma, unspecified, unspecified site: Secondary | ICD-10-CM | POA: Diagnosis not present

## 2020-03-09 MED ORDER — PEGFILGRASTIM-JMDB 6 MG/0.6ML ~~LOC~~ SOSY
PREFILLED_SYRINGE | SUBCUTANEOUS | Status: AC
  Filled 2020-03-09: qty 0.6

## 2020-03-09 MED ORDER — PEGFILGRASTIM-JMDB 6 MG/0.6ML ~~LOC~~ SOSY
6.0000 mg | PREFILLED_SYRINGE | Freq: Once | SUBCUTANEOUS | Status: AC
  Administered 2020-03-09: 6 mg via SUBCUTANEOUS

## 2020-03-09 NOTE — Progress Notes (Signed)
.  Tyler [name] presents today for injection per the provider's orders.  Fulphila administrated without incident; injection site WNL; see MAR for injection details.  Patient tolerated procedure well and without incident.  No questions or complaints noted at this time.

## 2020-03-22 ENCOUNTER — Other Ambulatory Visit: Payer: Self-pay

## 2020-03-22 ENCOUNTER — Inpatient Hospital Stay (HOSPITAL_COMMUNITY): Payer: BC Managed Care – PPO

## 2020-03-22 ENCOUNTER — Inpatient Hospital Stay (HOSPITAL_BASED_OUTPATIENT_CLINIC_OR_DEPARTMENT_OTHER): Payer: BC Managed Care – PPO | Admitting: Hematology

## 2020-03-22 VITALS — BP 130/69 | HR 86 | Temp 97.5°F | Resp 18 | Wt 264.1 lb

## 2020-03-22 VITALS — BP 148/76 | HR 85 | Temp 97.3°F | Resp 18

## 2020-03-22 DIAGNOSIS — Z5111 Encounter for antineoplastic chemotherapy: Secondary | ICD-10-CM | POA: Diagnosis not present

## 2020-03-22 DIAGNOSIS — C8118 Nodular sclerosis classical Hodgkin lymphoma, lymph nodes of multiple sites: Secondary | ICD-10-CM

## 2020-03-22 DIAGNOSIS — C8190 Hodgkin lymphoma, unspecified, unspecified site: Secondary | ICD-10-CM | POA: Diagnosis not present

## 2020-03-22 DIAGNOSIS — Z5189 Encounter for other specified aftercare: Secondary | ICD-10-CM | POA: Diagnosis not present

## 2020-03-22 LAB — CBC WITH DIFFERENTIAL/PLATELET
Abs Immature Granulocytes: 0.16 10*3/uL — ABNORMAL HIGH (ref 0.00–0.07)
Basophils Absolute: 0.1 10*3/uL (ref 0.0–0.1)
Basophils Relative: 1 %
Eosinophils Absolute: 0.2 10*3/uL (ref 0.0–0.5)
Eosinophils Relative: 3 %
HCT: 36.5 % — ABNORMAL LOW (ref 39.0–52.0)
Hemoglobin: 12 g/dL — ABNORMAL LOW (ref 13.0–17.0)
Immature Granulocytes: 2 %
Lymphocytes Relative: 21 %
Lymphs Abs: 1.7 10*3/uL (ref 0.7–4.0)
MCH: 29.5 pg (ref 26.0–34.0)
MCHC: 32.9 g/dL (ref 30.0–36.0)
MCV: 89.7 fL (ref 80.0–100.0)
Monocytes Absolute: 0.7 10*3/uL (ref 0.1–1.0)
Monocytes Relative: 8 %
Neutro Abs: 5.4 10*3/uL (ref 1.7–7.7)
Neutrophils Relative %: 65 %
Platelets: 301 10*3/uL (ref 150–400)
RBC: 4.07 MIL/uL — ABNORMAL LOW (ref 4.22–5.81)
RDW: 18.9 % — ABNORMAL HIGH (ref 11.5–15.5)
WBC: 8.2 10*3/uL (ref 4.0–10.5)
nRBC: 0 % (ref 0.0–0.2)

## 2020-03-22 LAB — COMPREHENSIVE METABOLIC PANEL
ALT: 31 U/L (ref 0–44)
AST: 12 U/L — ABNORMAL LOW (ref 15–41)
Albumin: 4 g/dL (ref 3.5–5.0)
Alkaline Phosphatase: 88 U/L (ref 38–126)
Anion gap: 8 (ref 5–15)
BUN: 17 mg/dL (ref 6–20)
CO2: 25 mmol/L (ref 22–32)
Calcium: 8.7 mg/dL — ABNORMAL LOW (ref 8.9–10.3)
Chloride: 103 mmol/L (ref 98–111)
Creatinine, Ser: 0.99 mg/dL (ref 0.61–1.24)
GFR calc Af Amer: >60 mL/min (ref >60)
GFR calc non Af Amer: >60 mL/min (ref >60)
Glucose, Bld: 134 mg/dL — ABNORMAL HIGH (ref 70–99)
Potassium: 3.8 mmol/L (ref 3.5–5.1)
Sodium: 136 mmol/L (ref 135–145)
Total Bilirubin: 0.3 mg/dL (ref 0.3–1.2)
Total Protein: 6.8 g/dL (ref 6.5–8.1)

## 2020-03-22 LAB — MAGNESIUM: Magnesium: 2 mg/dL (ref 1.7–2.4)

## 2020-03-22 LAB — PHOSPHORUS: Phosphorus: 3.7 mg/dL (ref 2.5–4.6)

## 2020-03-22 LAB — URIC ACID: Uric Acid, Serum: 7.6 mg/dL (ref 3.7–8.6)

## 2020-03-22 MED ORDER — VINBLASTINE SULFATE CHEMO INJECTION 1 MG/ML
6.1000 mg/m2 | Freq: Once | INTRAVENOUS | Status: AC
  Administered 2020-03-22: 15 mg via INTRAVENOUS
  Filled 2020-03-22: qty 15

## 2020-03-22 MED ORDER — DOXORUBICIN HCL CHEMO IV INJECTION 2 MG/ML
25.0000 mg/m2 | Freq: Once | INTRAVENOUS | Status: AC
  Administered 2020-03-22: 62 mg via INTRAVENOUS
  Filled 2020-03-22: qty 31

## 2020-03-22 MED ORDER — SODIUM CHLORIDE 0.9 % IV SOLN: Freq: Once | INTRAVENOUS | Status: AC

## 2020-03-22 MED ORDER — HEPARIN SOD (PORK) LOCK FLUSH 100 UNIT/ML IV SOLN
500.0000 [IU] | Freq: Once | INTRAVENOUS | Status: AC | PRN
  Administered 2020-03-22: 500 [IU]

## 2020-03-22 MED ORDER — SODIUM CHLORIDE 0.9% FLUSH
10.0000 mL | INTRAVENOUS | Status: DC | PRN
  Administered 2020-03-22: 10 mL

## 2020-03-22 MED ORDER — SODIUM CHLORIDE 0.9 % IV SOLN
10.0000 [IU]/m2 | Freq: Once | INTRAVENOUS | Status: AC
  Administered 2020-03-22: 25 [IU] via INTRAVENOUS
  Filled 2020-03-22: qty 8.33

## 2020-03-22 MED ORDER — SODIUM CHLORIDE 0.9 % IV SOLN
375.0000 mg/m2 | Freq: Once | INTRAVENOUS | Status: AC
  Administered 2020-03-22: 920 mg via INTRAVENOUS
  Filled 2020-03-22: qty 92

## 2020-03-22 MED ORDER — SODIUM CHLORIDE 0.9 % IV SOLN
10.0000 mg | Freq: Once | INTRAVENOUS | Status: AC
  Administered 2020-03-22: 10 mg via INTRAVENOUS
  Filled 2020-03-22: qty 10

## 2020-03-22 MED ORDER — SODIUM CHLORIDE 0.9 % IV SOLN
150.0000 mg | Freq: Once | INTRAVENOUS | Status: AC
  Administered 2020-03-22: 150 mg via INTRAVENOUS
  Filled 2020-03-22: qty 150

## 2020-03-22 MED ORDER — PALONOSETRON HCL INJECTION 0.25 MG/5ML
0.2500 mg | Freq: Once | INTRAVENOUS | Status: AC
  Administered 2020-03-22: 0.25 mg via INTRAVENOUS
  Filled 2020-03-22: qty 5

## 2020-03-22 NOTE — Progress Notes (Signed)
Patient has been assessed, vital signs and labs have been reviewed by Dr. Katragadda. ANC, Creatinine, LFTs, and Platelets are within treatment parameters per Dr. Katragadda. The patient is good to proceed with treatment at this time.  

## 2020-03-22 NOTE — Progress Notes (Signed)
[place], [place] [address]   CLINIC:  Medical Oncology/Hematology  PCP:  Mikey Kirschner, [place] / [place] [address]  [phone]  REASON FOR VISIT:  Follow-up for Hodgkin's disease   CURRENT THERAPY: ABVD  INTERVAL HISTORY:  [name], a 24 y.o. male, returns for routine follow-up for his Hodgkin's disease. [name] was last seen on 03/08/2020.  He is here for cycle 3-day 15 of treatment.  He complained of hands feeling tight occasionally and go away when he shakes them.  Occasional sleepiness in the left foot also lasting few seconds and goes away upon movement.  Denies any nausea vomiting.  Appetite and energy levels are 100%.    REVIEW OF SYSTEMS:  Review of Systems  Constitutional: Negative for unexpected weight change.  Neurological:       Occasional tightness in hands  All other systems reviewed and are negative.   PAST MEDICAL/SURGICAL HISTORY:  Past Medical History:  Diagnosis Date  . ADD (attention deficit disorder)    Inattention type  . Cyclic vomiting syndrome 2009  . GERD (gastroesophageal reflux disease)   . Hypertension    Past Surgical History:  Procedure Laterality Date  . closed reduction rt. wrist    . IR IMAGING GUIDED PORT INSERTION  12/31/2019  . MASS BIOPSY Left 01/03/2020   Procedure: OPEN NECK BIOPSY;  Surgeon: Leta Baptist, MD;  Location: [facility];  Service: ENT;  Laterality: Left;    SOCIAL HISTORY:  Social History   Socioeconomic History  . Marital status: Single    Spouse name: Not on file  . Number of children: 0  . Years of education: Not on file  . Highest education level: Not on file  Occupational History  . Not on file  Tobacco Use  . Smoking status: Never Smoker  . Smokeless tobacco: Former Network engineer and Sexual Activity  . Alcohol use: Never  . Drug use: Never  . Sexual activity: Not Currently  Other Topics Concern  . Not on  file  Social History Narrative  . Not on file   Social Determinants of Health   Financial Resource Strain: Low Risk   . Difficulty of Paying Living Expenses: Not hard at all  Food Insecurity: No Food Insecurity  . Worried About Charity fundraiser in the Last Year: Never true  . Ran Out of Food in the Last Year: Never true  Transportation Needs: No Transportation Needs  . Lack of Transportation (Medical): No  . Lack of Transportation (Non-Medical): No  Physical Activity: Inactive  . Days of Exercise per Week: 0 days  . Minutes of Exercise per Session: 0 min  Stress: No Stress Concern Present  . Feeling of Stress : Not at all  Social Connections: Somewhat Isolated  . Frequency of Communication with Friends and Family: More than three times a week  . Frequency of Social Gatherings with Friends and Family: More than three times a week  . Attends Religious Services: More than 4 times per year  . Active Member of Clubs or Organizations: No  . Attends Archivist Meetings: Never  . Marital Status: Never married  Intimate Partner Violence: Not At Risk  . Fear of Current or Ex-Partner: No  . Emotionally Abused: No  . Physically Abused: No  . Sexually Abused: No    FAMILY HISTORY:  Family History  Problem Relation Age of Onset  .  Cancer Paternal Grandmother   . Hypertension Mother   . Hypertension Father     CURRENT MEDICATIONS:  Current Outpatient Medications  Medication Sig Dispense Refill  . amLODipine (NORVASC) 10 MG tablet Take 1 tablet (10 mg total) by mouth daily. 90 tablet 1  . bleomycin in sodium chloride 0.9 % 50 mL Inject into the vein every 14 (fourteen) days. Day 1, 15 every 28 days    . DACARBAZINE IV Inject into the vein every 14 (fourteen) days. Day 1, 15 every 28 days    . DOXORUBICIN HCL IV Inject into the vein every 14 (fourteen) days. Day 1, 15 every 28 days    . loratadine (CLARITIN) 10 MG tablet Take 10 mg by mouth daily.    Marland Kitchen omeprazole  (PRILOSEC OTC) 20 MG tablet Take 1 tablet (20 mg total) by mouth daily. 90 tablet 1  . vinBLAStine in sodium chloride 0.9 % 50 mL Inject into the vein. Day 1, 15 every 28 days    . acetaminophen (TYLENOL) 325 MG tablet Take 650 mg by mouth 2 (two) times daily as needed for moderate pain or fever.    . lidocaine-prilocaine (EMLA) cream Apply small amount over port site and cover with plastic wrap 1 hour before appointment. (Patient not taking: Reported on 03/22/2020) 30 g 3  . prochlorperazine (COMPAZINE) 10 MG tablet Take 1 tablet (10 mg total) by mouth every 6 (six) hours as needed (Nausea or vomiting). (Patient not taking: Reported on 03/22/2020) 30 tablet 1  . sodium chloride (OCEAN) 0.65 % SOLN nasal spray Place 1 spray into both nostrils as needed for congestion.     No current facility-administered medications for this visit.    ALLERGIES:  Allergies  Allergen Reactions  . Ace Inhibitors Swelling    PHYSICAL EXAM:  Performance status (ECOG): 0 - Asymptomatic  Vitals:   03/22/20 0850  BP: 130/69  Pulse: 86  Resp: 18  Temp: (!) 97.5 F (36.4 C)  SpO2: 99%   Wt Readings from Last 3 Encounters:  03/22/20 264 lb 1.6 oz (119.8 kg)  03/08/20 268 lb 3.2 oz (121.7 kg)  02/23/20 265 lb (120.2 kg)   Physical Exam Vitals reviewed. Exam conducted with a chaperone present.  Constitutional:      Appearance: Normal appearance.  Cardiovascular:     Rate and Rhythm: Normal rate and regular rhythm.     Heart sounds: Normal heart sounds.  Pulmonary:     Effort: Pulmonary effort is normal.     Breath sounds: Normal breath sounds.  Lymphadenopathy:     Cervical: No cervical adenopathy.  Skin:    General: Skin is warm.  Neurological:     General: No focal deficit present.     Mental Status: He is alert and oriented to person, place, and time.  Psychiatric:        Mood and Affect: Mood normal.        Behavior: Behavior normal.     LABORATORY DATA:  I have reviewed the labs as  listed.  CBC Latest Ref Rng & Units 03/22/2020 03/08/2020 02/23/2020  WBC 4.0 - 10.5 K/uL 8.2 9.8 10.3  Hemoglobin 13.0 - 17.0 g/dL 12.0(L) 11.6(L) 11.7(L)  Hematocrit 39.0 - 52.0 % 36.5(L) 36.4(L) 36.8(L)  Platelets 150 - 400 K/uL 301 282 249   CMP Latest Ref Rng & Units 03/22/2020 03/08/2020 02/23/2020  Glucose 70 - 99 mg/dL 134(H) 156(H) 154(H)  BUN 6 - 20 mg/dL 17 19 15   Creatinine 0.61 -  1.24 mg/dL 0.99 0.99 0.95  Sodium 135 - 145 mmol/L 136 137 138  Potassium 3.5 - 5.1 mmol/L 3.8 4.0 3.8  Chloride 98 - 111 mmol/L 103 104 104  CO2 22 - 32 mmol/L 25 25 24   Calcium 8.9 - 10.3 mg/dL 8.7(L) 8.5(L) 8.9  Total Protein 6.5 - 8.1 g/dL 6.8 6.7 7.1  Total Bilirubin 0.3 - 1.2 mg/dL 0.3 0.4 0.3  Alkaline Phos 38 - 126 U/L 88 99 106  AST 15 - 41 U/L 12(L) 15 16  ALT 0 - 44 U/L 31 30 37      Component Value Date/Time   RBC 4.07 (L) 03/22/2020 0842   MCV 89.7 03/22/2020 0842   MCV 84 12/24/2019 1556   MCH 29.5 03/22/2020 0842   MCHC 32.9 03/22/2020 0842   RDW 18.9 (H) 03/22/2020 0842   RDW 12.9 12/24/2019 1556   LYMPHSABS 1.7 03/22/2020 0842   LYMPHSABS 2.0 12/24/2019 1556   MONOABS 0.7 03/22/2020 0842   EOSABS 0.2 03/22/2020 0842   EOSABS 0.1 12/24/2019 1556   BASOSABS 0.1 03/22/2020 0842   BASOSABS 0.1 12/24/2019 1556    DIAGNOSTIC IMAGING:  I have independently reviewed the scans and discussed with the patient. 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 EXAM: NUCLEAR MEDICINE PET SKULL BASE TO THIGH TECHNIQUE: 13.3 mCi F-18 FDG was injected intravenously. Full-ring PET imaging was performed from the skull base to thigh after the radiotracer. CT data was obtained and used for attenuation correction and anatomic localization. Fasting blood glucose: 93 mg/dl COMPARISON:  01/05/2020 PET-CT.  12/27/2019 neck and chest  CT. FINDINGS: Mediastinal blood pool activity: SUV max 2.1 Liver activity: SUV max 3.8 NECK: Left lower neck lymph nodes have decreased in size and substantially decreased in metabolism. Representative 2.0 cm left level 4 neck node with max SUV 1.9 (series 4/image 49), previously 3.7 cm with max SUV 14.5. Representative 1.6 cm left supraclavicular node with max SUV 3.0 (series 4/image 51), previously 2.6 cm with max SUV 9.8. No new enlarged or hypermetabolic lymph nodes in the neck. Incidental CT findings: none CHEST: Previously visualized hypermetabolic bilateral mediastinal lymph nodes have decreased in size and substantially decreased in metabolism. Representative 1.9 cm left prevascular node with max SUV 2.4 (series 4/image 64), previously 5.3 cm with max SUV 10.2. Representative 0.8 cm right prevascular node with max SUV 1.7 (series 4/image 71), previously 2.3 cm with max SUV 7.8. No new hypermetabolic mediastinal nodes. No enlarged or hypermetabolic axillary or hilar lymph nodes. No hypermetabolic pulmonary findings. Incidental CT findings: Right internal jugular Port-A-Cath terminates in the lower third of the SVC. No acute consolidative airspace disease, lung masses or significant pulmonary nodules. ABDOMEN/PELVIS: No abnormal hypermetabolic activity within the liver, pancreas, adrenal glands, or spleen. No hypermetabolic lymph nodes in the abdomen. Previously described hypermetabolic porta hepatis lymph nodes have resolved. Borderline prominent 0.9 cm right external iliac lymph node with mild hypermetabolism with max SUV 3.5 (series 4/image 191), new. Incidental CT findings: Small hiatal hernia. SKELETON: No focal hypermetabolic activity to suggest skeletal metastasis. Diffuse marrow uptake in the axial and proximal appendicular skeleton, compatible with reactive marrow state. Incidental CT findings: none IMPRESSION: 1. Significant partial metabolic response. Left lower neck and bilateral mediastinal lymph  nodes are decreased in size and substantially decreased in metabolism, with residual Deauville category 3 activity. Resolved splenic  hypermetabolism. 2. Newly mildly hypermetabolic borderline prominent right external iliac lymph node is nonspecific, cannot exclude a new site of lymphoma. Electronically Signed   By: Ilona Sorrel M.D.   On: 03/06/2020 10:08     ASSESSMENT:  1.  Stage IV AE classical Hodgkin's disease: -PET scan on 01/05/2020 shows hypermetabolic left cervical, supraclavicular, bilateral mediastinal adenopathy.  Hypermetabolic porta hepatic lymph nodes.  Mildly increased FDG uptake associated with spleen which is above liver activity.  Diffuse increase uptake in the axial and proximal appendicular skeleton. -IPI score of 3. -2 cycles of ABVD on 01/12/2020 on 02/09/2020. -PET2 on 03/06/2010 shows left lower neck lymph node decreased in size and metabolic some.  Measures 2 cm, previously 3.7 cm with SUV 1.9, previously 14.5.  1.6 cm left supraclavicular lymph node with SUV 3.0, previously 2.6 cm with SUV 9.8.  Left prevascular lymph node decreased to 1.9 cm from 5.3 cm.  Right prevascular lymph node decreased to 0.8 cm, previously 2.3 cm.  Borderline prominent 0.9 cm right external iliac lymph node with mild hypermetabolism with maximum SUV 3.5 new. -The right external iliac lymph node is likely reactive.  I have recommended continuing 2 more cycles of ABVD and repeat PET scan after cycle 4.    PLAN:  1.  Stage IV classical Hodgkin's disease: -I have reviewed his labs.  LFTs and CBC are adequate to proceed with cycle 3-day 15 today.  We will reevaluate him in 2 weeks prior to start of cycle 4.  We will plan to repeat PET scan after cycle 4.  2.  Peripheral neuropathy: -Occasional numbness in the fingers 4 and 5 of the left hand when he is driving or sitting still for a long time.  This gets better upon movement. -He also reported hands feeling tight also lasting few seconds to minutes.   We will keep a close eye on it.  3.  Nausea/vomiting: -He reported nausea and vomiting on day 2 and 3 after chemotherapy.  Recommended to take Compazine in the mornings as a standing dose which helped.   Orders placed this encounter:  No orders of the defined types were placed in this encounter.    Derek Jack, MD [facility] [phone]   I, Jacqualyn Posey, am acting as a scribe for Dr. Sanda Linger.  I, Derek Jack MD, have reviewed the above documentation for accuracy and completeness, and I agree with the above.

## 2020-03-22 NOTE — Progress Notes (Signed)
Patient presents today for treatment and follow up  visit with Dr. Delton Coombes. Labs pending. Vital signs within parameters for treatment today. Patient denies any pain today. Patient denies andy significant changes since his last visit. Patient states his fingers and hands get tight like they are swelling per patient's words. Numbness of the hands has not gotten worse and remains the same per patient.  MAR reviewed.   Treatment given today per MD orders. Tolerated infusion without adverse affects. Vital signs stable. No complaints at this time. Discharged from clinic ambulatory. F/U with [facility] as scheduled.

## 2020-03-22 NOTE — Patient Instructions (Addendum)
[facility] at [facility] Discharge Instructions  You were seen today by Dr. Katragadda. He went over your recent results. He will see you back in 2 weeks for labs and follow up.   Thank you for choosing [facility] at [facility] to provide your oncology and hematology care.  To afford each patient quality time with our provider, please arrive at least 15 minutes before your scheduled appointment time.   If you have a lab appointment with the Cancer Center please come in thru the  Main Entrance and check in at the main information desk  You need to re-schedule your appointment should you arrive 10 or more minutes late.  We strive to give you quality time with our providers, and arriving late affects you and other patients whose appointments are after yours.  Also, if you no show three or more times for appointments you may be dismissed from the clinic at the providers discretion.     Again, thank you for choosing [facility].  Our hope is that these requests will decrease the amount of time that you wait before being seen by our physicians.       _____________________________________________________________  Should you have questions after your visit to [facility], please contact our office at [phone] between the hours of 8:00 a.m. and 4:30 p.m.  Voicemails left after 4:00 p.m. will not be returned until the following business day.  For prescription refill requests, have your pharmacy contact our office and allow 72 hours.    Cancer Center Support Programs:   > Cancer Support Group  2nd Tuesday of the month 1pm-2pm, Journey Room    

## 2020-03-22 NOTE — Patient Instructions (Signed)
[facility] Discharge Instructions for Patients Receiving Chemotherapy  Today you received the following chemotherapy agents   To help prevent nausea and vomiting after your treatment, we encourage you to take your nausea medication   If you develop nausea and vomiting that is not controlled by your nausea medication, call the clinic.   BELOW ARE SYMPTOMS THAT SHOULD BE REPORTED IMMEDIATELY:  *FEVER GREATER THAN 100.5 F  *CHILLS WITH OR WITHOUT FEVER  NAUSEA AND VOMITING THAT IS NOT CONTROLLED WITH YOUR NAUSEA MEDICATION  *UNUSUAL SHORTNESS OF BREATH  *UNUSUAL BRUISING OR BLEEDING  TENDERNESS IN MOUTH AND THROAT WITH OR WITHOUT PRESENCE OF ULCERS  *URINARY PROBLEMS  *BOWEL PROBLEMS  UNUSUAL RASH Items with * indicate a potential emergency and should be followed up as soon as possible.  Feel free to call the clinic should you have any questions or concerns. The clinic phone number is [phone].  Please show the CHEMO ALERT CARD at check-in to the Emergency Department and triage nurse.   

## 2020-03-23 ENCOUNTER — Inpatient Hospital Stay (HOSPITAL_COMMUNITY): Payer: BC Managed Care – PPO

## 2020-03-23 ENCOUNTER — Encounter (HOSPITAL_COMMUNITY): Payer: Self-pay

## 2020-03-23 VITALS — BP 136/75 | HR 97 | Temp 98.1°F | Resp 18

## 2020-03-23 DIAGNOSIS — C8190 Hodgkin lymphoma, unspecified, unspecified site: Secondary | ICD-10-CM | POA: Diagnosis not present

## 2020-03-23 DIAGNOSIS — Z5189 Encounter for other specified aftercare: Secondary | ICD-10-CM | POA: Diagnosis not present

## 2020-03-23 DIAGNOSIS — Z5111 Encounter for antineoplastic chemotherapy: Secondary | ICD-10-CM | POA: Diagnosis not present

## 2020-03-23 DIAGNOSIS — C8118 Nodular sclerosis classical Hodgkin lymphoma, lymph nodes of multiple sites: Secondary | ICD-10-CM

## 2020-03-23 MED ORDER — PEGFILGRASTIM-JMDB 6 MG/0.6ML ~~LOC~~ SOSY
6.0000 mg | PREFILLED_SYRINGE | Freq: Once | SUBCUTANEOUS | Status: AC
  Administered 2020-03-23: 6 mg via SUBCUTANEOUS

## 2020-03-23 NOTE — Patient Instructions (Signed)
[place] at [facility] Discharge Instructions  Received Fulphila injection today. Follow-up as scheduled   Thank you for choosing [place] at [facility] to provide your oncology and hematology care.  To afford each patient quality time with our provider, please arrive at least 15 minutes before your scheduled appointment time.   If you have a lab appointment with the [facility] please come in thru the Main Entrance and check in at the main information desk.  You need to re-schedule your appointment should you arrive 10 or more minutes late.  We strive to give you quality time with our providers, and arriving late affects you and other patients whose appointments are after yours.  Also, if you no show three or more times for appointments you may be dismissed from the clinic at the providers discretion.     Again, thank you for choosing [facility].  Our hope is that these requests will decrease the amount of time that you wait before being seen by our physicians.       _____________________________________________________________  Should you have questions after your visit to [facility], please contact our office at [phone] between the hours of 8:00 a.m. and 4:30 p.m.  Voicemails left after 4:00 p.m. will not be returned until the following business day.  For prescription refill requests, have your pharmacy contact our office and allow 72 hours.    Due to Covid, you will need to wear a mask upon entering the hospital. If you do not have a mask, a mask will be given to you at the Main Entrance upon arrival. For doctor visits, patients may have 1 support person with them. For treatment visits, patients can not have anyone with them due to social distancing guidelines and our immunocompromised population.

## 2020-03-23 NOTE — Progress Notes (Signed)
Melford Aase tolerated Fulphila injection well without complaints or incident. VSS Pt discharged self ambulatory in satisfactory condition

## 2020-03-24 ENCOUNTER — Other Ambulatory Visit: Payer: Self-pay | Admitting: Family Medicine

## 2020-03-24 MED ORDER — CYANOCOBALAMIN 1000 MCG/ML IJ SOLN
INTRAMUSCULAR | Status: AC
  Filled 2020-03-24: qty 1

## 2020-04-05 ENCOUNTER — Inpatient Hospital Stay (HOSPITAL_COMMUNITY): Payer: BC Managed Care – PPO | Attending: Hematology

## 2020-04-05 ENCOUNTER — Inpatient Hospital Stay (HOSPITAL_COMMUNITY): Payer: BC Managed Care – PPO

## 2020-04-05 ENCOUNTER — Encounter (HOSPITAL_COMMUNITY): Payer: Self-pay | Admitting: Hematology

## 2020-04-05 ENCOUNTER — Other Ambulatory Visit: Payer: Self-pay

## 2020-04-05 ENCOUNTER — Inpatient Hospital Stay (HOSPITAL_BASED_OUTPATIENT_CLINIC_OR_DEPARTMENT_OTHER): Payer: BC Managed Care – PPO | Admitting: Hematology

## 2020-04-05 VITALS — BP 142/83 | HR 85 | Temp 97.5°F | Resp 18 | Wt 263.0 lb

## 2020-04-05 VITALS — BP 130/70 | HR 88 | Temp 98.6°F | Resp 16

## 2020-04-05 DIAGNOSIS — Z79899 Other long term (current) drug therapy: Secondary | ICD-10-CM | POA: Insufficient documentation

## 2020-04-05 DIAGNOSIS — C8190 Hodgkin lymphoma, unspecified, unspecified site: Secondary | ICD-10-CM | POA: Insufficient documentation

## 2020-04-05 DIAGNOSIS — Z5111 Encounter for antineoplastic chemotherapy: Secondary | ICD-10-CM | POA: Insufficient documentation

## 2020-04-05 DIAGNOSIS — C8118 Nodular sclerosis classical Hodgkin lymphoma, lymph nodes of multiple sites: Secondary | ICD-10-CM

## 2020-04-05 DIAGNOSIS — Z5189 Encounter for other specified aftercare: Secondary | ICD-10-CM | POA: Diagnosis not present

## 2020-04-05 LAB — CBC WITH DIFFERENTIAL/PLATELET
Abs Immature Granulocytes: 0.5 10*3/uL — ABNORMAL HIGH (ref 0.00–0.07)
Basophils Absolute: 0.1 10*3/uL (ref 0.0–0.1)
Basophils Relative: 1 %
Eosinophils Absolute: 0.2 10*3/uL (ref 0.0–0.5)
Eosinophils Relative: 2 %
HCT: 37 % — ABNORMAL LOW (ref 39.0–52.0)
Hemoglobin: 11.9 g/dL — ABNORMAL LOW (ref 13.0–17.0)
Immature Granulocytes: 5 %
Lymphocytes Relative: 20 %
Lymphs Abs: 2 10*3/uL (ref 0.7–4.0)
MCH: 28.7 pg (ref 26.0–34.0)
MCHC: 32.2 g/dL (ref 30.0–36.0)
MCV: 89.4 fL (ref 80.0–100.0)
Monocytes Absolute: 0.8 10*3/uL (ref 0.1–1.0)
Monocytes Relative: 8 %
Neutro Abs: 6.4 10*3/uL (ref 1.7–7.7)
Neutrophils Relative %: 64 %
Platelets: 280 10*3/uL (ref 150–400)
RBC: 4.14 MIL/uL — ABNORMAL LOW (ref 4.22–5.81)
RDW: 18.4 % — ABNORMAL HIGH (ref 11.5–15.5)
WBC: 10 10*3/uL (ref 4.0–10.5)
nRBC: 0 % (ref 0.0–0.2)

## 2020-04-05 LAB — COMPREHENSIVE METABOLIC PANEL
ALT: 32 U/L (ref 0–44)
AST: 15 U/L (ref 15–41)
Albumin: 3.8 g/dL (ref 3.5–5.0)
Alkaline Phosphatase: 85 U/L (ref 38–126)
Anion gap: 8 (ref 5–15)
BUN: 16 mg/dL (ref 6–20)
CO2: 26 mmol/L (ref 22–32)
Calcium: 8.7 mg/dL — ABNORMAL LOW (ref 8.9–10.3)
Chloride: 103 mmol/L (ref 98–111)
Creatinine, Ser: 0.96 mg/dL (ref 0.61–1.24)
GFR calc Af Amer: >60 mL/min (ref >60)
GFR calc non Af Amer: >60 mL/min (ref >60)
Glucose, Bld: 139 mg/dL — ABNORMAL HIGH (ref 70–99)
Potassium: 3.9 mmol/L (ref 3.5–5.1)
Sodium: 137 mmol/L (ref 135–145)
Total Bilirubin: 0.2 mg/dL — ABNORMAL LOW (ref 0.3–1.2)
Total Protein: 6.6 g/dL (ref 6.5–8.1)

## 2020-04-05 LAB — URIC ACID: Uric Acid, Serum: 7 mg/dL (ref 3.7–8.6)

## 2020-04-05 LAB — PHOSPHORUS: Phosphorus: 3.5 mg/dL (ref 2.5–4.6)

## 2020-04-05 LAB — MAGNESIUM: Magnesium: 1.9 mg/dL (ref 1.7–2.4)

## 2020-04-05 MED ORDER — VINBLASTINE SULFATE CHEMO INJECTION 1 MG/ML
6.1000 mg/m2 | Freq: Once | INTRAVENOUS | Status: AC
  Administered 2020-04-05: 15 mg via INTRAVENOUS
  Filled 2020-04-05: qty 15

## 2020-04-05 MED ORDER — SODIUM CHLORIDE 0.9 % IV SOLN
375.0000 mg/m2 | Freq: Once | INTRAVENOUS | Status: AC
  Administered 2020-04-05: 920 mg via INTRAVENOUS
  Filled 2020-04-05: qty 92

## 2020-04-05 MED ORDER — HEPARIN SOD (PORK) LOCK FLUSH 100 UNIT/ML IV SOLN
500.0000 [IU] | Freq: Once | INTRAVENOUS | Status: AC | PRN
  Administered 2020-04-05: 500 [IU]

## 2020-04-05 MED ORDER — PALONOSETRON HCL INJECTION 0.25 MG/5ML
0.2500 mg | Freq: Once | INTRAVENOUS | Status: AC
  Administered 2020-04-05: 0.25 mg via INTRAVENOUS
  Filled 2020-04-05: qty 5

## 2020-04-05 MED ORDER — SODIUM CHLORIDE 0.9 % IV SOLN
150.0000 mg | Freq: Once | INTRAVENOUS | Status: AC
  Administered 2020-04-05: 150 mg via INTRAVENOUS
  Filled 2020-04-05: qty 150

## 2020-04-05 MED ORDER — SODIUM CHLORIDE 0.9 % IV SOLN: INTRAVENOUS | Status: DC

## 2020-04-05 MED ORDER — DOXORUBICIN HCL CHEMO IV INJECTION 2 MG/ML
25.0000 mg/m2 | Freq: Once | INTRAVENOUS | Status: AC
  Administered 2020-04-05: 62 mg via INTRAVENOUS
  Filled 2020-04-05: qty 31

## 2020-04-05 MED ORDER — SODIUM CHLORIDE 0.9 % IV SOLN: Freq: Once | INTRAVENOUS | Status: AC

## 2020-04-05 MED ORDER — SODIUM CHLORIDE 0.9% FLUSH
10.0000 mL | INTRAVENOUS | Status: DC | PRN
  Administered 2020-04-05: 10 mL

## 2020-04-05 MED ORDER — SODIUM CHLORIDE 0.9 % IV SOLN
10.0000 [IU]/m2 | Freq: Once | INTRAVENOUS | Status: AC
  Administered 2020-04-05: 25 [IU] via INTRAVENOUS
  Filled 2020-04-05: qty 8.33

## 2020-04-05 MED ORDER — SODIUM CHLORIDE 0.9 % IV SOLN
10.0000 mg | Freq: Once | INTRAVENOUS | Status: AC
  Administered 2020-04-05: 10 mg via INTRAVENOUS
  Filled 2020-04-05: qty 10

## 2020-04-05 NOTE — Progress Notes (Signed)
Labs reviewed with MD today. Patient has no new complaints today. Ok to proceed with treatment today per MD.   Treatment given per orders. Patient tolerated it well without problems. Vitals stable and discharged home from clinic ambulatory. Follow up as scheduled.

## 2020-04-05 NOTE — Patient Instructions (Addendum)
[place] at [facility] Discharge Instructions  You were seen today by Dr. Delton Coombes. He went over your recent results. You will get another treatment in 2 weeks. You will be scheduled for a PET scan in 4 weeks. Dr. Delton Coombes will see you back in 4 weeks for labs and follow up.   Thank you for choosing [place] at [facility] to provide your oncology and hematology care.  To afford each patient quality time with our provider, please arrive at least 15 minutes before your scheduled appointment time.   If you have a lab appointment with the [facility] please come in thru the Main Entrance and check in at the main information desk  You need to re-schedule your appointment should you arrive 10 or more minutes late.  We strive to give you quality time with our providers, and arriving late affects you and other patients whose appointments are after yours.  Also, if you no show three or more times for appointments you may be dismissed from the clinic at the providers discretion.     Again, thank you for choosing [facility].  Our hope is that these requests will decrease the amount of time that you wait before being seen by our physicians.       _____________________________________________________________  Should you have questions after your visit to [facility], please contact our office at [phone] between the hours of 8:00 a.m. and 4:30 p.m.  Voicemails left after 4:00 p.m. will not be returned until the following business day.  For prescription refill requests, have your pharmacy contact our office and allow 72 hours.    Cancer Center Support Programs:   > Cancer Support Group  2nd Tuesday of the month 1pm-2pm, Journey Room

## 2020-04-05 NOTE — Progress Notes (Signed)
[address], [place] [address]   CLINIC:  Medical Oncology/Hematology  PCP:  Mikey Kirschner, [place] / [place] [address] [phone]   REASON FOR VISIT:  Follow-up for Hodgkin's disease  CURRENT THERAPY: ABVD  BRIEF ONCOLOGIC HISTORY:  Oncology History  Hodgkin's disease ([place])  01/06/2020 Initial Diagnosis   Hodgkin's disease ([place])   01/12/2020 -  Chemotherapy   The patient had DOXOrubicin (ADRIAMYCIN) chemo injection 62 mg, 25 mg/m2 = 62 mg, Intravenous,  Once, 3 of 6 cycles Administration: 62 mg (01/12/2020), 62 mg (01/26/2020), 62 mg (02/09/2020), 62 mg (02/23/2020), 62 mg (03/08/2020), 62 mg (03/22/2020) palonosetron (ALOXI) injection 0.25 mg, 0.25 mg, Intravenous,  Once, 3 of 6 cycles Administration: 0.25 mg (01/12/2020), 0.25 mg (01/26/2020), 0.25 mg (02/09/2020), 0.25 mg (02/23/2020), 0.25 mg (03/08/2020), 0.25 mg (03/22/2020) bleomycin (BLEOCIN) 25 Units in sodium chloride 0.9 % 50 mL chemo infusion, 10 Units/m2 = 25 Units, Intravenous,  Once, 3 of 6 cycles Administration: 25 Units (01/12/2020), 25 Units (01/26/2020), 25 Units (02/09/2020), 25 Units (02/23/2020), 25 Units (03/08/2020), 25 Units (03/22/2020) dacarbazine (DTIC) 920 mg in sodium chloride 0.9 % 250 mL chemo infusion, 375 mg/m2 = 920 mg, Intravenous,  Once, 3 of 6 cycles Administration: 920 mg (01/12/2020), 920 mg (01/26/2020), 920 mg (02/09/2020), 920 mg (02/23/2020), 920 mg (03/08/2020), 920 mg (03/22/2020) fosaprepitant (EMEND) 150 mg in sodium chloride 0.9 % 145 mL IVPB, 150 mg, Intravenous,  Once, 3 of 6 cycles Administration: 150 mg (01/12/2020), 150 mg (01/26/2020), 150 mg (02/09/2020), 150 mg (02/23/2020), 150 mg (03/08/2020), 150 mg (03/22/2020) vinBLAStine (VELBAN) 15 mg in sodium chloride 0.9 % 50 mL chemo infusion, 6.1 mg/m2 = 14.8 mg, Intravenous, Once, 3 of 6 cycles Administration: 15 mg (01/12/2020), 15 mg (01/26/2020), 15 mg (02/09/2020), 15 mg (02/23/2020), 15 mg  (03/08/2020), 15 mg (03/22/2020)  for chemotherapy treatment.      CANCER STAGING: Cancer Staging Hodgkin's disease [facility]) Staging form: Hodgkin and Non-Hodgkin Lymphoma, AJCC 8th Edition - Clinical stage from 01/06/2020: Stage IV (Hodgkin lymphoma, A - Asymptomatic) - Unsigned   INTERVAL HISTORY:  Mr. NOTNAMED SCHOLZ, a 24 y.o. male, returns for routine follow-up and consideration for next cycle of chemotherapy. Gorman was last seen on 03/22/2020.  Due for cycle #4 of ABVD today.   Overall, he tells me he has been feeling pretty well. He reports feeling fatigued for several days after his chemo.  His breathing is fine. He reports having some tingling and swelling in his hands, especially after waking up. His appetite is good, except immediately after chemo when he reports nausea. He takes Compazine in the mornings except when he has to work. He also reports taking a tick off of his skin a couple days ago.  Overall, he feels ready for next cycle of chemo today.    REVIEW OF SYSTEMS:  Review of Systems  Constitutional: Positive for appetite change (worse after chemo). Negative for fatigue.  Eyes: Positive for eye problems (eyes watering).  Cardiovascular: Negative for leg swelling.  Gastrointestinal: Positive for nausea (after chemo).  Neurological: Positive for numbness (+ swelling in hands).  All other systems reviewed and are negative.   PAST MEDICAL/SURGICAL HISTORY:  Past Medical History:  Diagnosis Date  . ADD (attention deficit disorder)    Inattention type  . Cyclic vomiting syndrome 2009  . GERD (gastroesophageal reflux disease)   . Hypertension    Past Surgical History:  Procedure Laterality Date  .  closed reduction rt. wrist    . IR IMAGING GUIDED PORT INSERTION  12/31/2019  . MASS BIOPSY Left 01/03/2020   Procedure: OPEN NECK BIOPSY;  Surgeon: Leta Baptist, MD;  Location: [place] [facility];  Service: ENT;  Laterality: Left;    SOCIAL HISTORY:  Social History    Socioeconomic History  . Marital status: Single    Spouse name: Not on file  . Number of children: 0  . Years of education: Not on file  . Highest education level: Not on file  Occupational History  . Not on file  Tobacco Use  . Smoking status: Never Smoker  . Smokeless tobacco: Former Network engineer and Sexual Activity  . Alcohol use: Never  . Drug use: Never  . Sexual activity: Not Currently  Other Topics Concern  . Not on file  Social History Narrative  . Not on file   Social Determinants of Health   Financial Resource Strain: Low Risk   . Difficulty of Paying Living Expenses: Not hard at all  Food Insecurity: No Food Insecurity  . Worried About Charity fundraiser in the Last Year: Never true  . Ran Out of Food in the Last Year: Never true  Transportation Needs: No Transportation Needs  . Lack of Transportation (Medical): No  . Lack of Transportation (Non-Medical): No  Physical Activity: Inactive  . Days of Exercise per Week: 0 days  . Minutes of Exercise per Session: 0 min  Stress: No Stress Concern Present  . Feeling of Stress : Not at all  Social Connections: Somewhat Isolated  . Frequency of Communication with Friends and Family: More than three times a week  . Frequency of Social Gatherings with Friends and Family: More than three times a week  . Attends Religious Services: More than 4 times per year  . Active Member of Clubs or Organizations: No  . Attends Archivist Meetings: Never  . Marital Status: Never married  Intimate Partner Violence: Not At Risk  . Fear of Current or Ex-Partner: No  . Emotionally Abused: No  . Physically Abused: No  . Sexually Abused: No    FAMILY HISTORY:  Family History  Problem Relation Age of Onset  . Cancer Paternal Grandmother   . Hypertension Mother   . Hypertension Father     CURRENT MEDICATIONS:  Current Outpatient Medications  Medication Sig Dispense Refill  . amLODipine (NORVASC) 10 MG tablet  Take 1 tablet by mouth once daily 90 tablet 0  . bleomycin in sodium chloride 0.9 % 50 mL Inject into the vein every 14 (fourteen) days. Day 1, 15 every 28 days    . DACARBAZINE IV Inject into the vein every 14 (fourteen) days. Day 1, 15 every 28 days    . DOXORUBICIN HCL IV Inject into the vein every 14 (fourteen) days. Day 1, 15 every 28 days    . loratadine (CLARITIN) 10 MG tablet Take 10 mg by mouth daily.    Marland Kitchen omeprazole (PRILOSEC OTC) 20 MG tablet Take 1 tablet (20 mg total) by mouth daily. 90 tablet 1  . prochlorperazine (COMPAZINE) 10 MG tablet Take 1 tablet (10 mg total) by mouth every 6 (six) hours as needed (Nausea or vomiting). 30 tablet 1  . sodium chloride (OCEAN) 0.65 % SOLN nasal spray Place 1 spray into both nostrils as needed for congestion.    . vinBLAStine in sodium chloride 0.9 % 50 mL Inject into the vein. Day 1, 15 every 28  days    . acetaminophen (TYLENOL) 325 MG tablet Take 650 mg by mouth 2 (two) times daily as needed for moderate pain or fever.    . lidocaine-prilocaine (EMLA) cream Apply small amount over port site and cover with plastic wrap 1 hour before appointment. (Patient not taking: Reported on 03/22/2020) 30 g 3   No current facility-administered medications for this visit.    ALLERGIES:  Allergies  Allergen Reactions  . Ace Inhibitors Swelling    PHYSICAL EXAM:  Performance status (ECOG): 1 - Symptomatic but completely ambulatory  Vitals:   04/05/20 0846  BP: (!) 142/83  Pulse: 85  Resp: 18  Temp: (!) 97.5 F (36.4 C)  SpO2: 100%   Wt Readings from Last 3 Encounters:  04/05/20 263 lb (119.3 kg)  03/22/20 264 lb 1.6 oz (119.8 kg)  03/08/20 268 lb 3.2 oz (121.7 kg)   Physical Exam Vitals reviewed.  Constitutional:      Appearance: Normal appearance.  Cardiovascular:     Rate and Rhythm: Normal rate and regular rhythm.     Pulses: Normal pulses.     Heart sounds: Normal heart sounds.  Pulmonary:     Effort: Pulmonary effort is normal.      Breath sounds: Normal breath sounds.  Abdominal:     Palpations: Abdomen is soft. There is no mass.     Tenderness: There is no abdominal tenderness.  Musculoskeletal:     Right lower leg: No edema.     Left lower leg: No edema.  Neurological:     General: No focal deficit present.     Mental Status: He is alert and oriented to person, place, and time.  Psychiatric:        Mood and Affect: Mood normal.        Behavior: Behavior normal.     LABORATORY DATA:  I have reviewed the labs as listed.  CBC Latest Ref Rng & Units 03/22/2020 03/08/2020 02/23/2020  WBC 4.0 - 10.5 K/uL 8.2 9.8 10.3  Hemoglobin 13.0 - 17.0 g/dL 12.0(L) 11.6(L) 11.7(L)  Hematocrit 39.0 - 52.0 % 36.5(L) 36.4(L) 36.8(L)  Platelets 150 - 400 K/uL 301 282 249   CMP Latest Ref Rng & Units 03/22/2020 03/08/2020 02/23/2020  Glucose 70 - 99 mg/dL 134(H) 156(H) 154(H)  BUN 6 - 20 mg/dL 17 19 15   Creatinine 0.61 - 1.24 mg/dL 0.99 0.99 0.95  Sodium 135 - 145 mmol/L 136 137 138  Potassium 3.5 - 5.1 mmol/L 3.8 4.0 3.8  Chloride 98 - 111 mmol/L 103 104 104  CO2 22 - 32 mmol/L 25 25 24   Calcium 8.9 - 10.3 mg/dL 8.7(L) 8.5(L) 8.9  Total Protein 6.5 - 8.1 g/dL 6.8 6.7 7.1  Total Bilirubin 0.3 - 1.2 mg/dL 0.3 0.4 0.3  Alkaline Phos 38 - 126 U/L 88 99 106  AST 15 - 41 U/L 12(L) 15 16  ALT 0 - 44 U/L 31 30 37    DIAGNOSTIC IMAGING:  I have independently reviewed the scans and discussed with the patient. No results found.   ASSESSMENT:  1.  Stage IV AE classical Hodgkin's disease: -PET scan on 01/05/2020 shows hypermetabolic left cervical, supraclavicular, bilateral mediastinal adenopathy.  Hypermetabolic porta hepatic lymph nodes.  Mildly increased FDG uptake associated with spleen which is above liver activity.  Diffuse increase uptake in the axial and proximal appendicular skeleton. -IPI score of 3. -2 cycles of ABVD on 01/12/2020 on 02/09/2020. -PET2 on 03/06/2010 shows left lower neck lymph node decreased  in size and  metabolic some.  Measures 2 cm, previously 3.7 cm with SUV 1.9, previously 14.5.  1.6 cm left supraclavicular lymph node with SUV 3.0, previously 2.6 cm with SUV 9.8.  Left prevascular lymph node decreased to 1.9 cm from 5.3 cm.  Right prevascular lymph node decreased to 0.8 cm, previously 2.3 cm.  Borderline prominent 0.9 cm right external iliac lymph node with mild hypermetabolism with maximum SUV 3.5 new. -The right external iliac lymph node is likely reactive.  I have recommended continuing 2 more cycles of ABVD and repeat PET scan after cycle 4.   PLAN:  1.  Stage IV classical Hodgkin's disease: -I have reviewed his labs.  CBC and LFTs are adequate. -He will proceed with cycle 4-day 1 treatment today. -He will come back in 2 weeks for his subsequent treatment.  I will see him back in 4 weeks with repeat PET scan to evaluate response.  2.  Peripheral neuropathy: -Occasional numbness in the fingers 4 and 5 of left hand when he is driving or sitting still for long time.  This gets better on movement.  No other worsening.  3.  Nausea/vomiting: -He has some nausea which is well controlled with Compazine.   Orders placed this encounter:  Orders Placed This Encounter  Procedures  . NM PET Image Restag (PS) Skull Base To Thigh     Derek Jack, MD [place] [phone]   I, Milinda Antis, am acting as a scribe for Dr. Sanda Linger.  I, Derek Jack MD, have reviewed the above documentation for accuracy and completeness, and I agree with the above.

## 2020-04-05 NOTE — Patient Instructions (Signed)
[facility] Discharge Instructions for Patients Receiving Chemotherapy  Today you received the following chemotherapy agents   To help prevent nausea and vomiting after your treatment, we encourage you to take your nausea medication   If you develop nausea and vomiting that is not controlled by your nausea medication, call the clinic.   BELOW ARE SYMPTOMS THAT SHOULD BE REPORTED IMMEDIATELY:  *FEVER GREATER THAN 100.5 F  *CHILLS WITH OR WITHOUT FEVER  NAUSEA AND VOMITING THAT IS NOT CONTROLLED WITH YOUR NAUSEA MEDICATION  *UNUSUAL SHORTNESS OF BREATH  *UNUSUAL BRUISING OR BLEEDING  TENDERNESS IN MOUTH AND THROAT WITH OR WITHOUT PRESENCE OF ULCERS  *URINARY PROBLEMS  *BOWEL PROBLEMS  UNUSUAL RASH Items with * indicate a potential emergency and should be followed up as soon as possible.  Feel free to call the clinic should you have any questions or concerns. The clinic phone number is [phone].  Please show the CHEMO ALERT CARD at check-in to the Emergency Department and triage nurse.   

## 2020-04-05 NOTE — Progress Notes (Signed)
Patient has been assessed, vital signs and labs have been reviewed by Dr. Katragadda. ANC, Creatinine, LFTs, and Platelets are within treatment parameters per Dr. Katragadda. The patient is good to proceed with treatment at this time.  

## 2020-04-07 ENCOUNTER — Inpatient Hospital Stay (HOSPITAL_COMMUNITY): Payer: BC Managed Care – PPO

## 2020-04-07 ENCOUNTER — Other Ambulatory Visit: Payer: Self-pay

## 2020-04-07 VITALS — BP 129/74 | HR 73 | Temp 96.9°F | Resp 18

## 2020-04-07 DIAGNOSIS — C8118 Nodular sclerosis classical Hodgkin lymphoma, lymph nodes of multiple sites: Secondary | ICD-10-CM

## 2020-04-07 DIAGNOSIS — Z79899 Other long term (current) drug therapy: Secondary | ICD-10-CM | POA: Diagnosis not present

## 2020-04-07 DIAGNOSIS — Z5111 Encounter for antineoplastic chemotherapy: Secondary | ICD-10-CM | POA: Diagnosis not present

## 2020-04-07 DIAGNOSIS — C8190 Hodgkin lymphoma, unspecified, unspecified site: Secondary | ICD-10-CM | POA: Diagnosis not present

## 2020-04-07 DIAGNOSIS — Z5189 Encounter for other specified aftercare: Secondary | ICD-10-CM | POA: Diagnosis not present

## 2020-04-07 MED ORDER — PEGFILGRASTIM-JMDB 6 MG/0.6ML ~~LOC~~ SOSY
6.0000 mg | PREFILLED_SYRINGE | Freq: Once | SUBCUTANEOUS | Status: AC
  Administered 2020-04-07: 6 mg via SUBCUTANEOUS
  Filled 2020-04-07: qty 0.6

## 2020-04-07 NOTE — Progress Notes (Signed)
Patient tolerated Fulphilia injection with no complaints voiced.  Site clean and dry with no bruising or swelling noted.  No complaints of pain.  Discharged with vital signs stable and no signs or symptoms of distress noted.  

## 2020-04-19 ENCOUNTER — Inpatient Hospital Stay (HOSPITAL_COMMUNITY): Payer: BC Managed Care – PPO

## 2020-04-19 ENCOUNTER — Other Ambulatory Visit: Payer: Self-pay

## 2020-04-19 ENCOUNTER — Ambulatory Visit (HOSPITAL_COMMUNITY): Payer: BC Managed Care – PPO | Admitting: Oncology

## 2020-04-19 ENCOUNTER — Encounter (HOSPITAL_COMMUNITY): Payer: Self-pay

## 2020-04-19 VITALS — BP 144/72 | HR 84 | Temp 98.5°F | Resp 18 | Wt 262.8 lb

## 2020-04-19 DIAGNOSIS — C8118 Nodular sclerosis classical Hodgkin lymphoma, lymph nodes of multiple sites: Secondary | ICD-10-CM

## 2020-04-19 DIAGNOSIS — Z5189 Encounter for other specified aftercare: Secondary | ICD-10-CM | POA: Diagnosis not present

## 2020-04-19 DIAGNOSIS — Z5111 Encounter for antineoplastic chemotherapy: Secondary | ICD-10-CM | POA: Diagnosis not present

## 2020-04-19 DIAGNOSIS — C8190 Hodgkin lymphoma, unspecified, unspecified site: Secondary | ICD-10-CM | POA: Diagnosis not present

## 2020-04-19 DIAGNOSIS — Z79899 Other long term (current) drug therapy: Secondary | ICD-10-CM | POA: Diagnosis not present

## 2020-04-19 LAB — COMPREHENSIVE METABOLIC PANEL
ALT: 31 U/L (ref 0–44)
AST: 15 U/L (ref 15–41)
Albumin: 3.8 g/dL (ref 3.5–5.0)
Alkaline Phosphatase: 83 U/L (ref 38–126)
Anion gap: 9 (ref 5–15)
BUN: 15 mg/dL (ref 6–20)
CO2: 26 mmol/L (ref 22–32)
Calcium: 8.8 mg/dL — ABNORMAL LOW (ref 8.9–10.3)
Chloride: 103 mmol/L (ref 98–111)
Creatinine, Ser: 0.92 mg/dL (ref 0.61–1.24)
GFR calc Af Amer: >60 mL/min (ref >60)
GFR calc non Af Amer: >60 mL/min (ref >60)
Glucose, Bld: 126 mg/dL — ABNORMAL HIGH (ref 70–99)
Potassium: 3.9 mmol/L (ref 3.5–5.1)
Sodium: 138 mmol/L (ref 135–145)
Total Bilirubin: 0.4 mg/dL (ref 0.3–1.2)
Total Protein: 6.7 g/dL (ref 6.5–8.1)

## 2020-04-19 LAB — PHOSPHORUS: Phosphorus: 3.4 mg/dL (ref 2.5–4.6)

## 2020-04-19 LAB — CBC WITH DIFFERENTIAL/PLATELET
Abs Immature Granulocytes: 0.23 10*3/uL — ABNORMAL HIGH (ref 0.00–0.07)
Basophils Absolute: 0.1 10*3/uL (ref 0.0–0.1)
Basophils Relative: 1 %
Eosinophils Absolute: 0.2 10*3/uL (ref 0.0–0.5)
Eosinophils Relative: 3 %
HCT: 37.7 % — ABNORMAL LOW (ref 39.0–52.0)
Hemoglobin: 12 g/dL — ABNORMAL LOW (ref 13.0–17.0)
Immature Granulocytes: 3 %
Lymphocytes Relative: 19 %
Lymphs Abs: 1.6 10*3/uL (ref 0.7–4.0)
MCH: 29.1 pg (ref 26.0–34.0)
MCHC: 31.8 g/dL (ref 30.0–36.0)
MCV: 91.3 fL (ref 80.0–100.0)
Monocytes Absolute: 0.7 10*3/uL (ref 0.1–1.0)
Monocytes Relative: 8 %
Neutro Abs: 5.8 10*3/uL (ref 1.7–7.7)
Neutrophils Relative %: 66 %
Platelets: 249 10*3/uL (ref 150–400)
RBC: 4.13 MIL/uL — ABNORMAL LOW (ref 4.22–5.81)
RDW: 17.8 % — ABNORMAL HIGH (ref 11.5–15.5)
WBC: 8.6 10*3/uL (ref 4.0–10.5)
nRBC: 0 % (ref 0.0–0.2)

## 2020-04-19 LAB — LACTATE DEHYDROGENASE: LDH: 192 U/L (ref 98–192)

## 2020-04-19 LAB — URIC ACID: Uric Acid, Serum: 6.4 mg/dL (ref 3.7–8.6)

## 2020-04-19 LAB — MAGNESIUM: Magnesium: 2 mg/dL (ref 1.7–2.4)

## 2020-04-19 MED ORDER — SODIUM CHLORIDE 0.9% FLUSH
10.0000 mL | INTRAVENOUS | Status: DC | PRN
  Administered 2020-04-19: 10 mL via INTRAVENOUS

## 2020-04-19 MED ORDER — SODIUM CHLORIDE 0.9 % IV SOLN
375.0000 mg/m2 | Freq: Once | INTRAVENOUS | Status: AC
  Administered 2020-04-19: 920 mg via INTRAVENOUS
  Filled 2020-04-19: qty 92

## 2020-04-19 MED ORDER — SODIUM CHLORIDE 0.9 % IV SOLN
10.0000 mg | Freq: Once | INTRAVENOUS | Status: AC
  Administered 2020-04-19: 10 mg via INTRAVENOUS
  Filled 2020-04-19: qty 10

## 2020-04-19 MED ORDER — SODIUM CHLORIDE 0.9 % IV SOLN: Freq: Once | INTRAVENOUS | Status: AC

## 2020-04-19 MED ORDER — SODIUM CHLORIDE 0.9% FLUSH
10.0000 mL | INTRAVENOUS | Status: DC | PRN
  Administered 2020-04-19: 10 mL

## 2020-04-19 MED ORDER — SODIUM CHLORIDE 0.9 % IV SOLN
10.0000 [IU]/m2 | Freq: Once | INTRAVENOUS | Status: AC
  Administered 2020-04-19: 25 [IU] via INTRAVENOUS
  Filled 2020-04-19: qty 8.33

## 2020-04-19 MED ORDER — HEPARIN SOD (PORK) LOCK FLUSH 100 UNIT/ML IV SOLN
500.0000 [IU] | Freq: Once | INTRAVENOUS | Status: AC | PRN
  Administered 2020-04-19: 500 [IU]

## 2020-04-19 MED ORDER — SODIUM CHLORIDE 0.9 % IV SOLN
150.0000 mg | Freq: Once | INTRAVENOUS | Status: AC
  Administered 2020-04-19: 150 mg via INTRAVENOUS
  Filled 2020-04-19: qty 150

## 2020-04-19 MED ORDER — VINBLASTINE SULFATE CHEMO INJECTION 1 MG/ML
6.1000 mg/m2 | Freq: Once | INTRAVENOUS | Status: AC
  Administered 2020-04-19: 15 mg via INTRAVENOUS
  Filled 2020-04-19: qty 15

## 2020-04-19 MED ORDER — PALONOSETRON HCL INJECTION 0.25 MG/5ML
0.2500 mg | Freq: Once | INTRAVENOUS | Status: AC
  Administered 2020-04-19: 0.25 mg via INTRAVENOUS
  Filled 2020-04-19: qty 5

## 2020-04-19 MED ORDER — DOXORUBICIN HCL CHEMO IV INJECTION 2 MG/ML
25.0000 mg/m2 | Freq: Once | INTRAVENOUS | Status: AC
  Administered 2020-04-19: 62 mg via INTRAVENOUS
  Filled 2020-04-19: qty 31

## 2020-04-19 NOTE — Patient Instructions (Signed)
[facility] Discharge Instructions for Patients Receiving Chemotherapy   Beginning January 23rd 2017 lab work for the [facility] will be done in the  Main lab at [facility] on 1st floor. If you have a lab appointment with the [facility] please come in thru the  Main Entrance and check in at the main information desk   Today you received the following chemotherapy agents Adriamycin,Vinblastin,Bleomycin and DTIC.Follow-up as scheduled.  To help prevent nausea and vomiting after your treatment, we encourage you to take your nausea medication   If you develop nausea and vomiting, or diarrhea that is not controlled by your medication, call the clinic.  The clinic phone number is [phone]. Office hours are Monday-Friday 8:30am-5:00pm.  BELOW ARE SYMPTOMS THAT SHOULD BE REPORTED IMMEDIATELY:  *FEVER GREATER THAN 101.0 F  *CHILLS WITH OR WITHOUT FEVER  NAUSEA AND VOMITING THAT IS NOT CONTROLLED WITH YOUR NAUSEA MEDICATION  *UNUSUAL SHORTNESS OF BREATH  *UNUSUAL BRUISING OR BLEEDING  TENDERNESS IN MOUTH AND THROAT WITH OR WITHOUT PRESENCE OF ULCERS  *URINARY PROBLEMS  *BOWEL PROBLEMS  UNUSUAL RASH Items with * indicate a potential emergency and should be followed up as soon as possible. If you have an emergency after office hours please contact your primary care physician or go to the nearest emergency department.  Please call the clinic during office hours if you have any questions or concerns.   You may also contact the Patient Navigator at [phone] should you have any questions or need assistance in obtaining follow up care.      Resources For Cancer Patients and their Caregivers ? American Cancer Society: Can assist with transportation, wigs, general needs, runs Look Good Feel Better.        ([phone] ? Cancer Care: Provides financial assistance, online support groups, medication/co-pay assistance.  1-800-813-HOPE 832-066-8623) ? [place] Assists [place] cancer patients and their families through emotional , educational and financial support.  337-876-1755 ? [place] DSS Where to apply for food stamps, Medicaid and utility assistance. 206-377-7492 ? [facility]: Transportation to medical appointments. (787)216-5039 ? Social Security Administration: May apply for disability if have a Stage IV cancer. 989-756-5455 (431)088-1781 ? [employer], Disability and Transit Services: Assists with nutrition, care and transit needs. ([phone])[phone]

## 2020-04-19 NOTE — Progress Notes (Signed)
0958 Labs reviewed with and pt's recent cough with yellowish sputum discussed with RLockamy NP and pt approved for tx with ABVD today per NP                                                                  Melford Aase tolerated chemo tx well without complaints or incident. Portacath checked with good blood return noted prior to,during and after Adriamycin push and prior to and after Vinblastine infusion. VSS upon discharge. Pt discharged self ambulatory in satisfactory condition

## 2020-04-21 ENCOUNTER — Encounter (HOSPITAL_COMMUNITY): Payer: Self-pay

## 2020-04-21 ENCOUNTER — Inpatient Hospital Stay (HOSPITAL_COMMUNITY): Payer: BC Managed Care – PPO

## 2020-04-21 ENCOUNTER — Other Ambulatory Visit: Payer: Self-pay

## 2020-04-21 VITALS — BP 134/70 | HR 77 | Temp 98.2°F | Resp 18

## 2020-04-21 DIAGNOSIS — Z5189 Encounter for other specified aftercare: Secondary | ICD-10-CM | POA: Diagnosis not present

## 2020-04-21 DIAGNOSIS — Z5111 Encounter for antineoplastic chemotherapy: Secondary | ICD-10-CM | POA: Diagnosis not present

## 2020-04-21 DIAGNOSIS — C8190 Hodgkin lymphoma, unspecified, unspecified site: Secondary | ICD-10-CM | POA: Diagnosis not present

## 2020-04-21 DIAGNOSIS — Z79899 Other long term (current) drug therapy: Secondary | ICD-10-CM | POA: Diagnosis not present

## 2020-04-21 DIAGNOSIS — C8118 Nodular sclerosis classical Hodgkin lymphoma, lymph nodes of multiple sites: Secondary | ICD-10-CM

## 2020-04-21 MED ORDER — PEGFILGRASTIM-JMDB 6 MG/0.6ML ~~LOC~~ SOSY
6.0000 mg | PREFILLED_SYRINGE | Freq: Once | SUBCUTANEOUS | Status: AC
  Administered 2020-04-21: 6 mg via SUBCUTANEOUS

## 2020-04-21 MED ORDER — PEGFILGRASTIM-JMDB 6 MG/0.6ML ~~LOC~~ SOSY
PREFILLED_SYRINGE | SUBCUTANEOUS | Status: AC
  Filled 2020-04-21: qty 0.6

## 2020-04-21 NOTE — Progress Notes (Signed)
Tyler [name] presents today for injection per the provider's orders.  Fulphila administration without incident; injection site WNL; see MAR for injection details.  Patient tolerated procedure well and without incident.  No questions or complaints noted at this time. Pt d/c clinic ambulatory

## 2020-04-28 ENCOUNTER — Other Ambulatory Visit: Payer: Self-pay

## 2020-04-28 ENCOUNTER — Ambulatory Visit (HOSPITAL_COMMUNITY)
Admission: RE | Admit: 2020-04-28 | Discharge: 2020-04-28 | Disposition: A | Discharge status: Home / Self Care | Payer: BC Managed Care – PPO | Source: Ambulatory Visit | Attending: Hematology | Admitting: Hematology

## 2020-04-28 DIAGNOSIS — C8190 Hodgkin lymphoma, unspecified, unspecified site: Secondary | ICD-10-CM | POA: Diagnosis not present

## 2020-04-28 DIAGNOSIS — C8118 Nodular sclerosis classical Hodgkin lymphoma, lymph nodes of multiple sites: Secondary | ICD-10-CM | POA: Insufficient documentation

## 2020-04-28 LAB — GLUCOSE, CAPILLARY: Glucose-Capillary: 104 mg/dL — ABNORMAL HIGH (ref 70–99)

## 2020-04-28 MED ORDER — FLUDEOXYGLUCOSE F - 18 (FDG) INJECTION: 13.1000 | Freq: Once | INTRAVENOUS | Status: DC | PRN

## 2020-05-01 ENCOUNTER — Other Ambulatory Visit (HOSPITAL_COMMUNITY): Payer: BC Managed Care – PPO

## 2020-05-03 ENCOUNTER — Other Ambulatory Visit: Payer: Self-pay

## 2020-05-03 ENCOUNTER — Inpatient Hospital Stay (HOSPITAL_COMMUNITY): Payer: BC Managed Care – PPO | Attending: Hematology

## 2020-05-03 ENCOUNTER — Inpatient Hospital Stay (HOSPITAL_COMMUNITY): Payer: BC Managed Care – PPO

## 2020-05-03 ENCOUNTER — Inpatient Hospital Stay (HOSPITAL_BASED_OUTPATIENT_CLINIC_OR_DEPARTMENT_OTHER): Payer: BC Managed Care – PPO | Admitting: Hematology

## 2020-05-03 VITALS — BP 120/71 | HR 83 | Temp 97.3°F | Resp 18

## 2020-05-03 VITALS — BP 149/68 | HR 86 | Temp 97.5°F | Resp 18 | Wt 262.0 lb

## 2020-05-03 DIAGNOSIS — C8190 Hodgkin lymphoma, unspecified, unspecified site: Secondary | ICD-10-CM | POA: Diagnosis not present

## 2020-05-03 DIAGNOSIS — C8118 Nodular sclerosis classical Hodgkin lymphoma, lymph nodes of multiple sites: Secondary | ICD-10-CM

## 2020-05-03 DIAGNOSIS — Z5111 Encounter for antineoplastic chemotherapy: Secondary | ICD-10-CM | POA: Insufficient documentation

## 2020-05-03 DIAGNOSIS — Z5189 Encounter for other specified aftercare: Secondary | ICD-10-CM | POA: Insufficient documentation

## 2020-05-03 LAB — CBC WITH DIFFERENTIAL/PLATELET
Abs Immature Granulocytes: 0.76 10*3/uL — ABNORMAL HIGH (ref 0.00–0.07)
Basophils Absolute: 0.1 10*3/uL (ref 0.0–0.1)
Basophils Relative: 1 %
Eosinophils Absolute: 0.2 10*3/uL (ref 0.0–0.5)
Eosinophils Relative: 2 %
HCT: 38.1 % — ABNORMAL LOW (ref 39.0–52.0)
Hemoglobin: 12.1 g/dL — ABNORMAL LOW (ref 13.0–17.0)
Immature Granulocytes: 7 %
Lymphocytes Relative: 18 %
Lymphs Abs: 1.8 10*3/uL (ref 0.7–4.0)
MCH: 29 pg (ref 26.0–34.0)
MCHC: 31.8 g/dL (ref 30.0–36.0)
MCV: 91.4 fL (ref 80.0–100.0)
Monocytes Absolute: 0.5 10*3/uL (ref 0.1–1.0)
Monocytes Relative: 5 %
Neutro Abs: 6.9 10*3/uL (ref 1.7–7.7)
Neutrophils Relative %: 67 %
Platelets: 261 10*3/uL (ref 150–400)
RBC: 4.17 MIL/uL — ABNORMAL LOW (ref 4.22–5.81)
RDW: 17 % — ABNORMAL HIGH (ref 11.5–15.5)
WBC: 10.3 10*3/uL (ref 4.0–10.5)
nRBC: 0 % (ref 0.0–0.2)

## 2020-05-03 LAB — MAGNESIUM: Magnesium: 2 mg/dL (ref 1.7–2.4)

## 2020-05-03 LAB — COMPREHENSIVE METABOLIC PANEL
ALT: 28 U/L (ref 0–44)
AST: 16 U/L (ref 15–41)
Albumin: 4.1 g/dL (ref 3.5–5.0)
Alkaline Phosphatase: 82 U/L (ref 38–126)
Anion gap: 8 (ref 5–15)
BUN: 17 mg/dL (ref 6–20)
CO2: 25 mmol/L (ref 22–32)
Calcium: 8.7 mg/dL — ABNORMAL LOW (ref 8.9–10.3)
Chloride: 101 mmol/L (ref 98–111)
Creatinine, Ser: 0.92 mg/dL (ref 0.61–1.24)
GFR calc Af Amer: >60 mL/min (ref >60)
GFR calc non Af Amer: >60 mL/min (ref >60)
Glucose, Bld: 147 mg/dL — ABNORMAL HIGH (ref 70–99)
Potassium: 3.9 mmol/L (ref 3.5–5.1)
Sodium: 134 mmol/L — ABNORMAL LOW (ref 135–145)
Total Bilirubin: 0.5 mg/dL (ref 0.3–1.2)
Total Protein: 6.9 g/dL (ref 6.5–8.1)

## 2020-05-03 LAB — PHOSPHORUS: Phosphorus: 3.7 mg/dL (ref 2.5–4.6)

## 2020-05-03 LAB — LACTATE DEHYDROGENASE: LDH: 214 U/L — ABNORMAL HIGH (ref 98–192)

## 2020-05-03 LAB — URIC ACID: Uric Acid, Serum: 7 mg/dL (ref 3.7–8.6)

## 2020-05-03 MED ORDER — SODIUM CHLORIDE 0.9 % IV SOLN: Freq: Once | INTRAVENOUS | Status: AC

## 2020-05-03 MED ORDER — SODIUM CHLORIDE 0.9 % IV SOLN
150.0000 mg | Freq: Once | INTRAVENOUS | Status: AC
  Administered 2020-05-03: 150 mg via INTRAVENOUS
  Filled 2020-05-03: qty 150

## 2020-05-03 MED ORDER — SODIUM CHLORIDE 0.9 % IV SOLN
10.0000 [IU]/m2 | Freq: Once | INTRAVENOUS | Status: AC
  Administered 2020-05-03: 25 [IU] via INTRAVENOUS
  Filled 2020-05-03: qty 8.33

## 2020-05-03 MED ORDER — SODIUM CHLORIDE 0.9% FLUSH
10.0000 mL | INTRAVENOUS | Status: DC | PRN
  Administered 2020-05-03: 10 mL

## 2020-05-03 MED ORDER — SODIUM CHLORIDE 0.9 % IV SOLN
10.0000 mg | Freq: Once | INTRAVENOUS | Status: AC
  Administered 2020-05-03: 10 mg via INTRAVENOUS
  Filled 2020-05-03: qty 10

## 2020-05-03 MED ORDER — PALONOSETRON HCL INJECTION 0.25 MG/5ML
0.2500 mg | Freq: Once | INTRAVENOUS | Status: AC
  Administered 2020-05-03: 0.25 mg via INTRAVENOUS
  Filled 2020-05-03: qty 5

## 2020-05-03 MED ORDER — HEPARIN SOD (PORK) LOCK FLUSH 100 UNIT/ML IV SOLN
500.0000 [IU] | Freq: Once | INTRAVENOUS | Status: AC | PRN
  Administered 2020-05-03: 500 [IU]

## 2020-05-03 MED ORDER — SODIUM CHLORIDE 0.9 % IV SOLN
375.0000 mg/m2 | Freq: Once | INTRAVENOUS | Status: AC
  Administered 2020-05-03: 920 mg via INTRAVENOUS
  Filled 2020-05-03: qty 92

## 2020-05-03 MED ORDER — DOXORUBICIN HCL CHEMO IV INJECTION 2 MG/ML
25.0000 mg/m2 | Freq: Once | INTRAVENOUS | Status: AC
  Administered 2020-05-03: 62 mg via INTRAVENOUS
  Filled 2020-05-03: qty 31

## 2020-05-03 MED ORDER — VINBLASTINE SULFATE CHEMO INJECTION 1 MG/ML
6.1000 mg/m2 | Freq: Once | INTRAVENOUS | Status: AC
  Administered 2020-05-03: 15 mg via INTRAVENOUS
  Filled 2020-05-03: qty 15

## 2020-05-03 MED ORDER — SODIUM CHLORIDE 0.9% FLUSH
10.0000 mL | INTRAVENOUS | Status: DC | PRN
  Administered 2020-05-03: 10 mL via INTRAVENOUS

## 2020-05-03 NOTE — Progress Notes (Signed)
Labs reviewed with MD today. Will proceed with treatment today per MD.   Treatment given per orders. Patient tolerated it well without problems. Vitals stable and discharged home from clinic ambulatory. Follow up as scheduled.  

## 2020-05-03 NOTE — Progress Notes (Signed)
Tyler [name], Tyler [name] [id number]   CLINIC:  Medical Oncology/Hematology  PCP:  Tyler [name], [place] / [place] [address] [phone]   REASON FOR VISIT:  Follow-up for Hodgkin's disease  PRIOR THERAPY: None  CURRENT THERAPY: ABVD  BRIEF ONCOLOGIC HISTORY:  Oncology History  Hodgkin's disease ([place])  01/06/2020 Initial Diagnosis   Hodgkin's disease ([place])   01/12/2020 -  Chemotherapy   The patient had DOXOrubicin (ADRIAMYCIN) chemo injection 62 mg, 25 mg/m2 = 62 mg, Intravenous,  Once, 4 of 6 cycles Administration: 62 mg (01/12/2020), 62 mg (01/26/2020), 62 mg (02/09/2020), 62 mg (02/23/2020), 62 mg (03/08/2020), 62 mg (03/22/2020), 62 mg (04/05/2020), 62 mg (04/19/2020) palonosetron (ALOXI) injection 0.25 mg, 0.25 mg, Intravenous,  Once, 4 of 6 cycles Administration: 0.25 mg (01/12/2020), 0.25 mg (01/26/2020), 0.25 mg (02/09/2020), 0.25 mg (02/23/2020), 0.25 mg (03/08/2020), 0.25 mg (03/22/2020), 0.25 mg (04/05/2020), 0.25 mg (04/19/2020) bleomycin (BLEOCIN) 25 Units in sodium chloride 0.9 % 50 mL chemo infusion, 10 Units/m2 = 25 Units, Intravenous,  Once, 4 of 6 cycles Administration: 25 Units (01/12/2020), 25 Units (01/26/2020), 25 Units (02/09/2020), 25 Units (02/23/2020), 25 Units (03/08/2020), 25 Units (03/22/2020), 25 Units (04/05/2020), 25 Units (04/19/2020) dacarbazine (DTIC) 920 mg in sodium chloride 0.9 % 250 mL chemo infusion, 375 mg/m2 = 920 mg, Intravenous,  Once, 4 of 6 cycles Administration: 920 mg (01/12/2020), 920 mg (01/26/2020), 920 mg (02/09/2020), 920 mg (02/23/2020), 920 mg (03/08/2020), 920 mg (03/22/2020), 920 mg (04/05/2020), 920 mg (04/19/2020) fosaprepitant (EMEND) 150 mg in sodium chloride 0.9 % 145 mL IVPB, 150 mg, Intravenous,  Once, 4 of 6 cycles Administration: 150 mg (01/12/2020), 150 mg (01/26/2020), 150 mg (02/09/2020), 150 mg (02/23/2020), 150 mg (03/08/2020), 150 mg (03/22/2020), 150 mg (04/05/2020), 150 mg  (04/19/2020) vinBLAStine (VELBAN) 15 mg in sodium chloride 0.9 % 50 mL chemo infusion, 6.1 mg/m2 = 14.8 mg, Intravenous, Once, 4 of 6 cycles Administration: 15 mg (01/12/2020), 15 mg (01/26/2020), 15 mg (02/09/2020), 15 mg (02/23/2020), 15 mg (03/08/2020), 15 mg (03/22/2020), 15 mg (04/05/2020), 15 mg (04/19/2020)  for chemotherapy treatment.      CANCER STAGING: Cancer Staging Hodgkin's disease [facility]) Staging form: Hodgkin and Non-Hodgkin Lymphoma, AJCC 8th Edition - Clinical stage from 01/06/2020: Stage IV (Hodgkin lymphoma, A - Asymptomatic) - Unsigned   INTERVAL HISTORY:  Tyler [name], a 24 y.o. male, returns for routine follow-up and consideration for next cycle of chemotherapy. Tyler [name] was last seen on 04/05/2020.  Due for cycle #5 of ABVD today.   Overall, he tells me he has been feeling pretty well. He has nausea controlled with Compazine after each treatment. He reports that his hands have been swollen since working the Tyler [name] yesterday, but the swelling has decreased today; the tingling in his hands is intermittent and comes when he keeps his hands in one position. He still has issues with his ingrown toenail. He denies having any issues with his breathing.  Overall, he feels ready for next cycle of chemo today.    REVIEW OF SYSTEMS:  Review of Systems  Constitutional: Positive for fatigue (mild). Negative for appetite change.  Respiratory: Negative for shortness of breath.   Cardiovascular: Negative for leg swelling.  Neurological: Positive for numbness (+ swelling in hands).  All other systems reviewed and are negative.   PAST MEDICAL/SURGICAL HISTORY:  Past Medical History:  Diagnosis Date   ADD (attention deficit disorder)    Inattention type   Cyclic  vomiting syndrome 2009   GERD (gastroesophageal reflux disease)    Hypertension    Past Surgical History:  Procedure Laterality Date   closed reduction rt. wrist     IR IMAGING GUIDED PORT INSERTION   12/31/2019   MASS BIOPSY Left 01/03/2020   Procedure: OPEN NECK BIOPSY;  Surgeon: Leta Baptist, MD;  Location: [facility];  Service: ENT;  Laterality: Left;    SOCIAL HISTORY:  Social History   Socioeconomic History   Marital status: Single    Spouse name: Not on file   Number of children: 0   Years of education: Not on file   Highest education level: Not on file  Occupational History   Not on file  Tobacco Use   Smoking status: Never Smoker   Smokeless tobacco: Former Counsellor Use: Never used  Substance and Sexual Activity   Alcohol use: Never   Drug use: Never   Sexual activity: Not Currently  Other Topics Concern   Not on file  Social History Narrative   Not on file   Social Determinants of Health   Financial Resource Strain: Low Risk    Difficulty of Paying Living Expenses: Not hard at all  Food Insecurity: No Food Insecurity   Worried About Charity fundraiser in the Last Year: Never true   [place] in the Last Year: Never true  Transportation Needs: No Transportation Needs   Lack of Transportation (Medical): No   Lack of Transportation (Non-Medical): No  Physical Activity: Inactive   Days of Exercise per Week: 0 days   Minutes of Exercise per Session: 0 min  Stress: No Stress Concern Present   Feeling of Stress : Not at all  Social Connections: Moderately Isolated   Frequency of Communication with Friends and Family: More than three times a week   Frequency of Social Gatherings with Friends and Family: More than three times a week   Attends Religious Services: More than 4 times per year   Active Member of [employer] or Organizations: No   Attends Music therapist: Never   Marital Status: Never married  Human resources officer Violence: Not At Risk   Fear of Current or Ex-Partner: No   Emotionally Abused: No   Physically Abused: No   Sexually Abused: No    FAMILY HISTORY:  Family History   Problem Relation Age of Onset   Cancer Paternal Grandmother    Hypertension Mother    Hypertension Father     CURRENT MEDICATIONS:  Current Outpatient Medications  Medication Sig Dispense Refill   amLODipine (NORVASC) 10 MG tablet Take 1 tablet by mouth once daily 90 tablet 0   bleomycin in sodium chloride 0.9 % 50 mL Inject into the vein every 14 (fourteen) days. Day 1, 15 every 28 days     DACARBAZINE IV Inject into the vein every 14 (fourteen) days. Day 1, 15 every 28 days     DOXORUBICIN HCL IV Inject into the vein every 14 (fourteen) days. Day 1, 15 every 28 days     loratadine (CLARITIN) 10 MG tablet Take 10 mg by mouth daily.     omeprazole (PRILOSEC OTC) 20 MG tablet Take 1 tablet (20 mg total) by mouth daily. 90 tablet 1   sodium chloride (OCEAN) 0.65 % SOLN nasal spray Place 1 spray into both nostrils as needed for congestion.     vinBLAStine in sodium chloride 0.9 % 50 mL Inject into  the vein. Day 1, 15 every 28 days     acetaminophen (TYLENOL) 325 MG tablet Take 650 mg by mouth 2 (two) times daily as needed for moderate pain or fever.  (Patient not taking: Reported on 05/03/2020)     lidocaine-prilocaine (EMLA) cream Apply small amount over port site and cover with plastic wrap 1 hour before appointment. (Patient not taking: Reported on 05/03/2020) 30 g 3   prochlorperazine (COMPAZINE) 10 MG tablet Take 1 tablet (10 mg total) by mouth every 6 (six) hours as needed (Nausea or vomiting). (Patient not taking: Reported on 05/03/2020) 30 tablet 1   No current facility-administered medications for this visit.   Facility-Administered Medications Ordered in Other Visits  Medication Dose Route Frequency Provider Last Rate Last Admin   fludeoxyglucose F - 18 (FDG) injection 56.2 millicurie  13.0 millicurie Intravenous Once PRN Gaspar Cola, MD       sodium chloride flush (NS) 0.9 % injection 10 mL  10 mL Intravenous PRN Derek Jack, MD   10 mL at 05/03/20 0949     ALLERGIES:  Allergies  Allergen Reactions   Ace Inhibitors Swelling    PHYSICAL EXAM:  Performance status (ECOG): 1 - Symptomatic but completely ambulatory  Vitals:   05/03/20 0941  BP: (!) 149/68  Pulse: 86  Resp: 18  Temp: (!) 97.5 F (36.4 C)  SpO2: 100%   Wt Readings from Last 3 Encounters:  05/03/20 118.8 kg (262 lb)  04/19/20 119.2 kg (262 lb 12.8 oz)  04/05/20 119.3 kg (263 lb)   Physical Exam Vitals reviewed.  Constitutional:      Appearance: Normal appearance.  Cardiovascular:     Rate and Rhythm: Normal rate and regular rhythm.     Pulses: Normal pulses.     Heart sounds: Normal heart sounds.  Pulmonary:     Effort: Pulmonary effort is normal.     Breath sounds: Normal breath sounds.  Musculoskeletal:        General: Swelling (bilat hands) present. No tenderness.  Lymphadenopathy:     Cervical: No cervical adenopathy.     Upper Body:     Right upper body: No supraclavicular adenopathy.     Left upper body: No supraclavicular adenopathy.  Neurological:     General: No focal deficit present.     Mental Status: He is alert and oriented to person, place, and time.  Psychiatric:        Mood and Affect: Mood normal.        Behavior: Behavior normal.     LABORATORY DATA:  I have reviewed the labs as listed.  CBC Latest Ref Rng & Units 05/03/2020 04/19/2020 04/05/2020  WBC 4.0 - 10.5 K/uL 10.3 8.6 10.0  Hemoglobin 13.0 - 17.0 g/dL 12.1(L) 12.0(L) 11.9(L)  Hematocrit 39 - 52 % 38.1(L) 37.7(L) 37.0(L)  Platelets 150 - 400 K/uL 261 249 280   CMP Latest Ref Rng & Units 05/03/2020 04/19/2020 04/05/2020  Glucose 70 - 99 mg/dL 147(H) 126(H) 139(H)  BUN 6 - 20 mg/dL 17 15 16   Creatinine 0.61 - 1.24 mg/dL 0.92 0.92 0.96  Sodium 135 - 145 mmol/L 134(L) 138 137  Potassium 3.5 - 5.1 mmol/L 3.9 3.9 3.9  Chloride 98 - 111 mmol/L 101 103 103  CO2 22 - 32 mmol/L 25 26 26   Calcium 8.9 - 10.3 mg/dL 8.7(L) 8.8(L) 8.7(L)  Total Protein 6.5 - 8.1 g/dL 6.9 6.7 6.6  Total  Bilirubin 0.3 - 1.2 mg/dL 0.5 0.4 0.2(L)  Alkaline Phos  38 - 126 U/L 82 83 85  AST 15 - 41 U/L 16 15 15   ALT 0 - 44 U/L 28 31 32   Lab Results  Component Value Date   LDH 214 (H) 05/03/2020   LDH 192 04/19/2020   LDH 227 (H) 12/28/2019    DIAGNOSTIC IMAGING:  I have independently reviewed the scans and discussed with the patient. NM PET Image Restag (PS) Skull Base To Thigh  Result Date: 04/28/2020 CLINICAL DATA:  Subsequent treatment strategy for Hodgkin's lymphoma. EXAM: NUCLEAR MEDICINE PET SKULL BASE TO THIGH TECHNIQUE: 13.1 mCi F-18 FDG was injected intravenously. Full-ring PET imaging was performed from the skull base to thigh after the radiotracer. CT data was obtained and used for attenuation correction and anatomic localization. Fasting blood glucose: 104 mg/dl COMPARISON:  03/06/2020 FINDINGS: Mediastinal blood pool activity: SUV max 1.8 Liver activity: SUV max 3.5 NECK: Left level IV lymph node 1.2 cm in short axis on image 48/4 (formerly 2.0 cm), maximum SUV 2.0 and formerly 1.9. This is currently Deauville 3 activity. Incidental CT findings: none CHEST: The dominant mediastinal lymph node is a prevascular node measuring 1.5 cm in short axis on image 60/4 (formerly 2.0 cm) maximum SUV 2.1 (formerly 2.4) this is currently Deauville 3 disease. Incidental CT findings: Right Port-A-Cath tip: SVC. Mild scarring or atelectasis in the left upper lobe adjacent to the major fissure, maximum SUV in this region 1.4. ABDOMEN/PELVIS: The previously described right external iliac lymph node has a short axis diameter of 0.8 cm (previously 0.9 cm) with maximum SUV measured at 1.9 (formerly 3.5). Currently compatible with Deauville 3 activity. No new or progressive adenopathy identified. No accentuated activity in the spleen, which measures 11.1 by 5.9 by 11.3 cm (volume = 390 cm^3), within normal limits. Incidental CT findings: none SKELETON: Diffuse accentuated activity in the skeleton, without  well-defined focal lesions. This probably reflects granulocyte stimulation. Incidental CT findings: none IMPRESSION: 1. Significantly reduced size of left lower neck and mediastinal lymph nodes with roughly stable upper normal sized right external iliac lymph node. These continued to display Deauville 3 activity. 2. Diffuse accentuated activity in the skeleton, probably reflecting granulocyte stimulation. Electronically Signed   By: Van Clines M.D.   On: 04/28/2020 14:31     ASSESSMENT:  1. Stage IV AE classical Hodgkin's disease: -PET scan on 01/05/2020 shows hypermetabolic left cervical, supraclavicular, bilateral mediastinal adenopathy. Hypermetabolic porta hepatic lymph nodes. Mildly increased FDG uptake associated with spleen which is above liver activity. Diffuse increase uptake in the axial and proximal appendicular skeleton. -IPI score of 3. -2 cycles of ABVD on 01/12/2020 on 02/09/2020. -PET2on 03/06/2010 shows left lower neck lymph node decreased in size and metabolic some. Measures 2 cm, previously 3.7 cm with SUV 1.9, previously 14.5. 1.6 cm left supraclavicular lymph node with SUV 3.0, previously 2.6 cm with SUV 9.8. Left prevascular lymph node decreased to 1.9 cm from 5.3 cm. Right prevascular lymph node decreased to 0.8 cm, previously 2.3 cm. Borderline prominent 0.9 cm right external iliac lymph node with mild hypermetabolism with maximum SUV 3.5 new. -The right external iliac lymph node is likely reactive. I have recommended continuing 2 more cycles of ABVD and repeat PET scan after cycle 4. -PET 4 on 04/28/2020 shows previously described right external iliac lymph node measures 0.8 cm, previously 0.9 cm with maximum SUV 1.9, previously 3.5.  Significantly reduced size of the left lower neck and mediastinal lymph nodes, Deauville 3 activity.   PLAN:  1.  Stage IV classical Hodgkin's disease: -I discussed the results of the PET scan with the patient and his girlfriend. -I  have recommended 2 more cycles of chemotherapy.  We will continue bleomycin at this point. -I will see him back in 2 weeks for follow-up.  2. Peripheral neuropathy: -He does not have any numbness but feels that his hands are swollen when he is doing something.  They improve with shaking of the hands.  3. Nausea/vomiting: -Occasional nausea well controlled with Compazine.   Orders placed this encounter:  No orders of the defined types were placed in this encounter.    Derek Jack, MD [place] [phone]   I, Milinda Antis, am acting as a scribe for Dr. Sanda Linger.  I, Derek Jack MD, have reviewed the above documentation for accuracy and completeness, and I agree with the above.

## 2020-05-03 NOTE — Progress Notes (Signed)
Patient has been assessed, vital signs and labs have been reviewed by Dr. Katragadda. ANC, Creatinine, LFTs, and Platelets are within treatment parameters per Dr. Katragadda. The patient is good to proceed with treatment at this time.  

## 2020-05-03 NOTE — Patient Instructions (Signed)
[facility] at [facility] °Discharge Instructions ° °You were seen today by Dr. Katragadda. He went over your recent results. You received your treatment today. Dr. Katragadda will see you back in 2 weeks for labs and follow up. ° ° °Thank you for choosing [facility] at [facility] to provide your oncology and hematology care.  To afford each patient quality time with our provider, please arrive at least 15 minutes before your scheduled appointment time.  ° °If you have a lab appointment with the Cancer Center please come in thru the Main Entrance and check in at the main information desk ° °You need to re-schedule your appointment should you arrive 10 or more minutes late.  We strive to give you quality time with our providers, and arriving late affects you and other patients whose appointments are after yours.  Also, if you no show three or more times for appointments you may be dismissed from the clinic at the providers discretion.     °Again, thank you for choosing [facility].  Our hope is that these requests will decrease the amount of time that you wait before being seen by our physicians.       °_____________________________________________________________ ° °Should you have questions after your visit to [facility], please contact our office at [phone] between the hours of 8:00 a.m. and 4:30 p.m.  Voicemails left after 4:00 p.m. will not be returned until the following business day.  For prescription refill requests, have your pharmacy contact our office and allow 72 hours.   ° °Cancer Center Support Programs:  ° °> Cancer Support Group  °2nd Tuesday of the month 1pm-2pm, Journey Room  ° ° °

## 2020-05-03 NOTE — Patient Instructions (Signed)
[facility] Discharge Instructions for Patients Receiving Chemotherapy  Today you received the following chemotherapy agents   To help prevent nausea and vomiting after your treatment, we encourage you to take your nausea medication   If you develop nausea and vomiting that is not controlled by your nausea medication, call the clinic.   BELOW ARE SYMPTOMS THAT SHOULD BE REPORTED IMMEDIATELY:  *FEVER GREATER THAN 100.5 F  *CHILLS WITH OR WITHOUT FEVER  NAUSEA AND VOMITING THAT IS NOT CONTROLLED WITH YOUR NAUSEA MEDICATION  *UNUSUAL SHORTNESS OF BREATH  *UNUSUAL BRUISING OR BLEEDING  TENDERNESS IN MOUTH AND THROAT WITH OR WITHOUT PRESENCE OF ULCERS  *URINARY PROBLEMS  *BOWEL PROBLEMS  UNUSUAL RASH Items with * indicate a potential emergency and should be followed up as soon as possible.  Feel free to call the clinic should you have any questions or concerns. The clinic phone number is [phone].  Please show the CHEMO ALERT CARD at check-in to the Emergency Department and triage nurse.   

## 2020-05-05 ENCOUNTER — Inpatient Hospital Stay (HOSPITAL_COMMUNITY): Payer: BC Managed Care – PPO

## 2020-05-05 ENCOUNTER — Other Ambulatory Visit: Payer: Self-pay

## 2020-05-05 VITALS — BP 124/67 | HR 83 | Temp 98.6°F | Resp 16

## 2020-05-05 DIAGNOSIS — C8118 Nodular sclerosis classical Hodgkin lymphoma, lymph nodes of multiple sites: Secondary | ICD-10-CM

## 2020-05-05 DIAGNOSIS — Z5111 Encounter for antineoplastic chemotherapy: Secondary | ICD-10-CM | POA: Diagnosis not present

## 2020-05-05 DIAGNOSIS — Z5189 Encounter for other specified aftercare: Secondary | ICD-10-CM | POA: Diagnosis not present

## 2020-05-05 DIAGNOSIS — C8190 Hodgkin lymphoma, unspecified, unspecified site: Secondary | ICD-10-CM | POA: Diagnosis not present

## 2020-05-05 MED ORDER — PEGFILGRASTIM-JMDB 6 MG/0.6ML ~~LOC~~ SOSY
6.0000 mg | PREFILLED_SYRINGE | Freq: Once | SUBCUTANEOUS | Status: AC
  Administered 2020-05-05: 6 mg via SUBCUTANEOUS
  Filled 2020-05-05: qty 0.6

## 2020-05-05 NOTE — Progress Notes (Signed)
Melford Aase presents today for injection per the provider's orders.  Fulphila administration without incident; injection site WNL; see MAR for injection details.  Patient tolerated procedure well and without incident.  No questions or complaints noted at this time. Pt d/c clinic ambulatory.

## 2020-05-15 ENCOUNTER — Other Ambulatory Visit (HOSPITAL_COMMUNITY): Payer: Self-pay | Admitting: *Deleted

## 2020-05-15 ENCOUNTER — Encounter (HOSPITAL_COMMUNITY): Payer: Self-pay

## 2020-05-15 DIAGNOSIS — C8118 Nodular sclerosis classical Hodgkin lymphoma, lymph nodes of multiple sites: Secondary | ICD-10-CM

## 2020-05-15 MED ORDER — PROCHLORPERAZINE MALEATE 10 MG PO TABS: 10.0000 mg | ORAL_TABLET | Freq: Four times a day (QID) | ORAL | 2 refills | Status: DC | PRN

## 2020-05-17 ENCOUNTER — Inpatient Hospital Stay (HOSPITAL_COMMUNITY): Payer: BC Managed Care – PPO

## 2020-05-17 ENCOUNTER — Other Ambulatory Visit: Payer: Self-pay

## 2020-05-17 ENCOUNTER — Inpatient Hospital Stay (HOSPITAL_BASED_OUTPATIENT_CLINIC_OR_DEPARTMENT_OTHER): Payer: BC Managed Care – PPO | Admitting: Hematology

## 2020-05-17 VITALS — BP 131/64 | HR 73 | Temp 97.3°F | Resp 18

## 2020-05-17 VITALS — BP 133/82 | HR 82 | Temp 97.3°F | Resp 18 | Wt 263.2 lb

## 2020-05-17 DIAGNOSIS — C8118 Nodular sclerosis classical Hodgkin lymphoma, lymph nodes of multiple sites: Secondary | ICD-10-CM

## 2020-05-17 DIAGNOSIS — C8190 Hodgkin lymphoma, unspecified, unspecified site: Secondary | ICD-10-CM | POA: Diagnosis not present

## 2020-05-17 DIAGNOSIS — Z5189 Encounter for other specified aftercare: Secondary | ICD-10-CM | POA: Diagnosis not present

## 2020-05-17 DIAGNOSIS — Z5111 Encounter for antineoplastic chemotherapy: Secondary | ICD-10-CM | POA: Diagnosis not present

## 2020-05-17 LAB — COMPREHENSIVE METABOLIC PANEL
ALT: 30 U/L (ref 0–44)
AST: 14 U/L — ABNORMAL LOW (ref 15–41)
Albumin: 3.8 g/dL (ref 3.5–5.0)
Alkaline Phosphatase: 86 U/L (ref 38–126)
Anion gap: 10 (ref 5–15)
BUN: 16 mg/dL (ref 6–20)
CO2: 23 mmol/L (ref 22–32)
Calcium: 8.5 mg/dL — ABNORMAL LOW (ref 8.9–10.3)
Chloride: 104 mmol/L (ref 98–111)
Creatinine, Ser: 0.95 mg/dL (ref 0.61–1.24)
GFR calc Af Amer: >60 mL/min (ref >60)
GFR calc non Af Amer: >60 mL/min (ref >60)
Glucose, Bld: 126 mg/dL — ABNORMAL HIGH (ref 70–99)
Potassium: 4 mmol/L (ref 3.5–5.1)
Sodium: 137 mmol/L (ref 135–145)
Total Bilirubin: 0.6 mg/dL (ref 0.3–1.2)
Total Protein: 6.6 g/dL (ref 6.5–8.1)

## 2020-05-17 LAB — CBC WITH DIFFERENTIAL/PLATELET
Abs Immature Granulocytes: 0.56 10*3/uL — ABNORMAL HIGH (ref 0.00–0.07)
Basophils Absolute: 0.1 10*3/uL (ref 0.0–0.1)
Basophils Relative: 1 %
Eosinophils Absolute: 0.2 10*3/uL (ref 0.0–0.5)
Eosinophils Relative: 2 %
HCT: 37.2 % — ABNORMAL LOW (ref 39.0–52.0)
Hemoglobin: 12.1 g/dL — ABNORMAL LOW (ref 13.0–17.0)
Immature Granulocytes: 5 %
Lymphocytes Relative: 15 %
Lymphs Abs: 1.6 10*3/uL (ref 0.7–4.0)
MCH: 29.8 pg (ref 26.0–34.0)
MCHC: 32.5 g/dL (ref 30.0–36.0)
MCV: 91.6 fL (ref 80.0–100.0)
Monocytes Absolute: 0.7 10*3/uL (ref 0.1–1.0)
Monocytes Relative: 6 %
Neutro Abs: 7.7 10*3/uL (ref 1.7–7.7)
Neutrophils Relative %: 71 %
Platelets: 239 10*3/uL (ref 150–400)
RBC: 4.06 MIL/uL — ABNORMAL LOW (ref 4.22–5.81)
RDW: 16.4 % — ABNORMAL HIGH (ref 11.5–15.5)
WBC: 10.9 10*3/uL — ABNORMAL HIGH (ref 4.0–10.5)
nRBC: 0 % (ref 0.0–0.2)

## 2020-05-17 LAB — PHOSPHORUS: Phosphorus: 3.4 mg/dL (ref 2.5–4.6)

## 2020-05-17 LAB — LACTATE DEHYDROGENASE: LDH: 203 U/L — ABNORMAL HIGH (ref 98–192)

## 2020-05-17 LAB — MAGNESIUM: Magnesium: 2 mg/dL (ref 1.7–2.4)

## 2020-05-17 LAB — URIC ACID: Uric Acid, Serum: 7.4 mg/dL (ref 3.7–8.6)

## 2020-05-17 MED ORDER — DOXORUBICIN HCL CHEMO IV INJECTION 2 MG/ML
25.0000 mg/m2 | Freq: Once | INTRAVENOUS | Status: AC
  Administered 2020-05-17: 62 mg via INTRAVENOUS
  Filled 2020-05-17: qty 31

## 2020-05-17 MED ORDER — SODIUM CHLORIDE 0.9 % IV SOLN: Freq: Once | INTRAVENOUS | Status: AC

## 2020-05-17 MED ORDER — SODIUM CHLORIDE 0.9% FLUSH
10.0000 mL | INTRAVENOUS | Status: DC | PRN
  Administered 2020-05-17: 10 mL

## 2020-05-17 MED ORDER — SODIUM CHLORIDE 0.9 % IV SOLN
10.0000 [IU]/m2 | Freq: Once | INTRAVENOUS | Status: AC
  Administered 2020-05-17: 25 [IU] via INTRAVENOUS
  Filled 2020-05-17: qty 8.33

## 2020-05-17 MED ORDER — SODIUM CHLORIDE 0.9 % IV SOLN
150.0000 mg | Freq: Once | INTRAVENOUS | Status: AC
  Administered 2020-05-17: 150 mg via INTRAVENOUS
  Filled 2020-05-17: qty 150

## 2020-05-17 MED ORDER — SODIUM CHLORIDE 0.9 % IV SOLN
10.0000 mg | Freq: Once | INTRAVENOUS | Status: AC
  Administered 2020-05-17: 10 mg via INTRAVENOUS
  Filled 2020-05-17: qty 10

## 2020-05-17 MED ORDER — VINBLASTINE SULFATE CHEMO INJECTION 1 MG/ML
6.1000 mg/m2 | Freq: Once | INTRAVENOUS | Status: AC
  Administered 2020-05-17: 15 mg via INTRAVENOUS
  Filled 2020-05-17: qty 15

## 2020-05-17 MED ORDER — HEPARIN SOD (PORK) LOCK FLUSH 100 UNIT/ML IV SOLN
500.0000 [IU] | Freq: Once | INTRAVENOUS | Status: AC | PRN
  Administered 2020-05-17: 500 [IU]

## 2020-05-17 MED ORDER — SODIUM CHLORIDE 0.9 % IV SOLN
375.0000 mg/m2 | Freq: Once | INTRAVENOUS | Status: AC
  Administered 2020-05-17: 920 mg via INTRAVENOUS
  Filled 2020-05-17: qty 92

## 2020-05-17 MED ORDER — SODIUM CHLORIDE 0.9% FLUSH
10.0000 mL | INTRAVENOUS | Status: DC | PRN
  Administered 2020-05-17: 10 mL via INTRAVENOUS

## 2020-05-17 MED ORDER — PALONOSETRON HCL INJECTION 0.25 MG/5ML
0.2500 mg | Freq: Once | INTRAVENOUS | Status: AC
  Administered 2020-05-17: 0.25 mg via INTRAVENOUS
  Filled 2020-05-17: qty 5

## 2020-05-17 NOTE — Progress Notes (Signed)
Patient has been assessed, vital signs and labs have been reviewed by Dr. Katragadda. ANC, Creatinine, LFTs, and Platelets are within treatment parameters per Dr. Katragadda. The patient is good to proceed with treatment at this time.  

## 2020-05-17 NOTE — Patient Instructions (Signed)
[place] at [facility] Discharge Instructions  You were seen today by Dr. Delton Coombes. He went over your recent results. You received your treatment today. Continue monitoring for additional symptoms in your hands, such as tingling, and report it to the office. Dr. Delton Coombes will see you back in 2 weeks for labs and follow up.   Thank you for choosing [place] at [facility] to provide your oncology and hematology care.  To afford each patient quality time with our provider, please arrive at least 15 minutes before your scheduled appointment time.   If you have a lab appointment with the [facility] please come in thru the Main Entrance and check in at the main information desk  You need to re-schedule your appointment should you arrive 10 or more minutes late.  We strive to give you quality time with our providers, and arriving late affects you and other patients whose appointments are after yours.  Also, if you no show three or more times for appointments you may be dismissed from the clinic at the providers discretion.     Again, thank you for choosing [facility]  Our hope is that these requests will decrease the amount of time that you wait before being seen by our physicians.       _____________________________________________________________  Should you have questions after your visit to [facility], please contact our office at [phone] between the hours of 8:00 a.m. and 4:30 p.m.  Voicemails left after 4:00 p.m. will not be returned until the following business day.  For prescription refill requests, have your pharmacy contact our office and allow 72 hours.    Cancer Center Support Programs:   > Cancer Support Group  2nd Tuesday of the month 1pm-2pm, Journey Room

## 2020-05-17 NOTE — Progress Notes (Signed)
Tyler [name], Tyler [name]   CLINIC:  Medical Oncology/Hematology  PCP:  Mikey Kirschner, MD (Inactive) None None   REASON FOR VISIT:  Follow-up for Hodgkin's disease  PRIOR THERAPY: None  CURRENT THERAPY: ABVD  BRIEF ONCOLOGIC HISTORY:  Oncology History  Hodgkin's disease ([place])  01/06/2020 Initial Diagnosis   Hodgkin's disease ([place])   01/12/2020 -  Chemotherapy   The patient had DOXOrubicin (ADRIAMYCIN) chemo injection 62 mg, 25 mg/m2 = 62 mg, Intravenous,  Once, 5 of 6 cycles Administration: 62 mg (01/12/2020), 62 mg (01/26/2020), 62 mg (02/09/2020), 62 mg (02/23/2020), 62 mg (03/08/2020), 62 mg (03/22/2020), 62 mg (04/05/2020), 62 mg (04/19/2020), 62 mg (05/03/2020) palonosetron (ALOXI) injection 0.25 mg, 0.25 mg, Intravenous,  Once, 5 of 6 cycles Administration: 0.25 mg (01/12/2020), 0.25 mg (01/26/2020), 0.25 mg (02/09/2020), 0.25 mg (02/23/2020), 0.25 mg (03/08/2020), 0.25 mg (03/22/2020), 0.25 mg (04/05/2020), 0.25 mg (04/19/2020), 0.25 mg (05/03/2020) bleomycin (BLEOCIN) 25 Units in sodium chloride 0.9 % 50 mL chemo infusion, 10 Units/m2 = 25 Units, Intravenous,  Once, 5 of 6 cycles Administration: 25 Units (01/12/2020), 25 Units (01/26/2020), 25 Units (02/09/2020), 25 Units (02/23/2020), 25 Units (03/08/2020), 25 Units (03/22/2020), 25 Units (04/05/2020), 25 Units (04/19/2020), 25 Units (05/03/2020) dacarbazine (DTIC) 920 mg in sodium chloride 0.9 % 250 mL chemo infusion, 375 mg/m2 = 920 mg, Intravenous,  Once, 5 of 6 cycles Administration: 920 mg (01/12/2020), 920 mg (01/26/2020), 920 mg (02/09/2020), 920 mg (02/23/2020), 920 mg (03/08/2020), 920 mg (03/22/2020), 920 mg (04/05/2020), 920 mg (04/19/2020), 920 mg (05/03/2020) fosaprepitant (EMEND) 150 mg in sodium chloride 0.9 % 145 mL IVPB, 150 mg, Intravenous,  Once, 5 of 6 cycles Administration: 150 mg (01/12/2020), 150 mg (01/26/2020), 150 mg (02/09/2020), 150 mg (02/23/2020), 150 mg (03/08/2020), 150 mg (03/22/2020), 150 mg  (04/05/2020), 150 mg (04/19/2020), 150 mg (05/03/2020) vinBLAStine (VELBAN) 15 mg in sodium chloride 0.9 % 50 mL chemo infusion, 6.1 mg/m2 = 14.8 mg, Intravenous, Once, 5 of 6 cycles Administration: 15 mg (01/12/2020), 15 mg (01/26/2020), 15 mg (02/09/2020), 15 mg (02/23/2020), 15 mg (03/08/2020), 15 mg (03/22/2020), 15 mg (04/05/2020), 15 mg (04/19/2020), 15 mg (05/03/2020)  for chemotherapy treatment.      CANCER STAGING: Cancer Staging Hodgkin's disease [facility]) Staging form: Hodgkin and Non-Hodgkin Lymphoma, AJCC 8th Edition - Clinical stage from 01/06/2020: Stage IV (Hodgkin lymphoma, A - Asymptomatic) - Unsigned   INTERVAL HISTORY:  Tyler [name], a 24 y.o. male, returns for routine follow-up and consideration for next cycle of chemotherapy. Tyler [name] was last seen on 05/03/2020.  Due for day #15 of cycle #5 of ABVD today.   Overall, today he tells me he has been feeling pretty well. He continues complaining of numbness, but no tingling, in his hands reaching up to his elbows if he does not use them even for a couple minutes or if they are dangling at his sides. The numbness does not subside the further he gets from his treatment. He denies dropping things at the moment. He reports having a bit of nausea, especially after treatments, but the Compazine helps. He denies vomiting, diarrhea, or constipation. He denies wheezing or SOB. He denies having any new pains.  Overall, he feels ready for next cycle of chemo today.    REVIEW OF SYSTEMS:  Review of Systems  Constitutional: Positive for fatigue. Negative for appetite change.  Respiratory: Negative for shortness of breath and wheezing.   Gastrointestinal: Positive for nausea.  Musculoskeletal: Negative for  arthralgias and myalgias.  Neurological: Positive for dizziness and numbness (hands).  All other systems reviewed and are negative.   PAST MEDICAL/SURGICAL HISTORY:  Past Medical History:  Diagnosis Date  . ADD (attention deficit disorder)     Inattention type  . Cyclic vomiting syndrome 2009  . GERD (gastroesophageal reflux disease)   . Hypertension    Past Surgical History:  Procedure Laterality Date  . closed reduction rt. wrist    . IR IMAGING GUIDED PORT INSERTION  12/31/2019  . MASS BIOPSY Left 01/03/2020   Procedure: OPEN NECK BIOPSY;  Surgeon: Leta Baptist, MD;  Location: [place];  Service: ENT;  Laterality: Left;    SOCIAL HISTORY:  Social History   Socioeconomic History  . Marital status: Single    Spouse name: Not on file  . Number of children: 0  . Years of education: Not on file  . Highest education level: Not on file  Occupational History  . Not on file  Tobacco Use  . Smoking status: Never Smoker  . Smokeless tobacco: Former Network engineer  . Vaping Use: Never used  Substance and Sexual Activity  . Alcohol use: Never  . Drug use: Never  . Sexual activity: Not Currently  Other Topics Concern  . Not on file  Social History Narrative  . Not on file   Social Determinants of Health   Financial Resource Strain: Low Risk   . Difficulty of Paying Living Expenses: Not hard at all  Food Insecurity: No Food Insecurity  . Worried About Charity fundraiser in the Last Year: Never true  . Ran Out of Food in the Last Year: Never true  Transportation Needs: No Transportation Needs  . Lack of Transportation (Medical): No  . Lack of Transportation (Non-Medical): No  Physical Activity: Inactive  . Days of Exercise per Week: 0 days  . Minutes of Exercise per Session: 0 min  Stress: No Stress Concern Present  . Feeling of Stress : Not at all  Social Connections: Moderately Isolated  . Frequency of Communication with Friends and Family: More than three times a week  . Frequency of Social Gatherings with Friends and Family: More than three times a week  . Attends Religious Services: More than 4 times per year  . Active Member of Clubs or Organizations: No  . Attends Archivist  Meetings: Never  . Marital Status: Never married  Intimate Partner Violence: Not At Risk  . Fear of Current or Ex-Partner: No  . Emotionally Abused: No  . Physically Abused: No  . Sexually Abused: No    FAMILY HISTORY:  Family History  Problem Relation Age of Onset  . Cancer Paternal Grandmother   . Hypertension Mother   . Hypertension Father     CURRENT MEDICATIONS:  Current Outpatient Medications  Medication Sig Dispense Refill  . amLODipine (NORVASC) 10 MG tablet Take 1 tablet by mouth once daily 90 tablet 0  . bleomycin in sodium chloride 0.9 % 50 mL Inject into the vein every 14 (fourteen) days. Day 1, 15 every 28 days    . DACARBAZINE IV Inject into the vein every 14 (fourteen) days. Day 1, 15 every 28 days    . DOXORUBICIN HCL IV Inject into the vein every 14 (fourteen) days. Day 1, 15 every 28 days    . loratadine (CLARITIN) 10 MG tablet Take 10 mg by mouth daily.    Marland Kitchen omeprazole (PRILOSEC OTC) 20 MG tablet Take  1 tablet (20 mg total) by mouth daily. 90 tablet 1  . sodium chloride (OCEAN) 0.65 % SOLN nasal spray Place 1 spray into both nostrils as needed for congestion.    . vinBLAStine in sodium chloride 0.9 % 50 mL Inject into the vein. Day 1, 15 every 28 days    . acetaminophen (TYLENOL) 325 MG tablet Take 650 mg by mouth 2 (two) times daily as needed for moderate pain or fever.  (Patient not taking: Reported on 05/17/2020)    . lidocaine-prilocaine (EMLA) cream Apply small amount over port site and cover with plastic wrap 1 hour before appointment. (Patient not taking: Reported on 05/17/2020) 30 g 3  . prochlorperazine (COMPAZINE) 10 MG tablet Take 1 tablet (10 mg total) by mouth every 6 (six) hours as needed (Nausea or vomiting). (Patient not taking: Reported on 05/17/2020) 45 tablet 2   No current facility-administered medications for this visit.   Facility-Administered Medications Ordered in Other Visits  Medication Dose Route Frequency Provider Last Rate Last Admin  .  sodium chloride flush (NS) 0.9 % injection 10 mL  10 mL Intravenous PRN Derek Jack, MD   10 mL at 05/17/20 8295    ALLERGIES:  Allergies  Allergen Reactions  . Ace Inhibitors Swelling    PHYSICAL EXAM:  Performance status (ECOG): 1 - Symptomatic but completely ambulatory  Vitals:   05/17/20 0827  BP: 133/82  Pulse: 82  Resp: 18  Temp: (!) 97.3 F (36.3 C)  SpO2: 99%   Wt Readings from Last 3 Encounters:  05/17/20 263 lb 3.2 oz (119.4 kg)  05/03/20 262 lb (118.8 kg)  04/19/20 262 lb 12.8 oz (119.2 kg)   Physical Exam  LABORATORY DATA:  I have reviewed the labs as listed.  CBC Latest Ref Rng & Units 05/17/2020 05/03/2020 04/19/2020  WBC 4.0 - 10.5 K/uL 10.9(H) 10.3 8.6  Hemoglobin 13.0 - 17.0 g/dL 12.1(L) 12.1(L) 12.0(L)  Hematocrit 39 - 52 % 37.2(L) 38.1(L) 37.7(L)  Platelets 150 - 400 K/uL 239 261 249   CMP Latest Ref Rng & Units 05/17/2020 05/03/2020 04/19/2020  Glucose 70 - 99 mg/dL 126(H) 147(H) 126(H)  BUN 6 - 20 mg/dL 16 17 15   Creatinine 0.61 - 1.24 mg/dL 0.95 0.92 0.92  Sodium 135 - 145 mmol/L 137 134(L) 138  Potassium 3.5 - 5.1 mmol/L 4.0 3.9 3.9  Chloride 98 - 111 mmol/L 104 101 103  CO2 22 - 32 mmol/L 23 25 26   Calcium 8.9 - 10.3 mg/dL 8.5(L) 8.7(L) 8.8(L)  Total Protein 6.5 - 8.1 g/dL 6.6 6.9 6.7  Total Bilirubin 0.3 - 1.2 mg/dL 0.6 0.5 0.4  Alkaline Phos 38 - 126 U/L 86 82 83  AST 15 - 41 U/L 14(L) 16 15  ALT 0 - 44 U/L 30 28 31     DIAGNOSTIC IMAGING:  I have independently reviewed the scans and discussed with the patient. NM PET Image Restag (PS) Skull Base To Thigh  Result Date: 04/28/2020 CLINICAL DATA:  Subsequent treatment strategy for Hodgkin's lymphoma. EXAM: NUCLEAR MEDICINE PET SKULL BASE TO THIGH TECHNIQUE: 13.1 mCi F-18 FDG was injected intravenously. Full-ring PET imaging was performed from the skull base to thigh after the radiotracer. CT data was obtained and used for attenuation correction and anatomic localization. Fasting blood  glucose: 104 mg/dl COMPARISON:  03/06/2020 FINDINGS: Mediastinal blood pool activity: SUV max 1.8 Liver activity: SUV max 3.5 NECK: Left level IV lymph node 1.2 cm in short axis on image 48/4 (formerly 2.0 cm), maximum  SUV 2.0 and formerly 1.9. This is currently Deauville 3 activity. Incidental CT findings: none CHEST: The dominant mediastinal lymph node is a prevascular node measuring 1.5 cm in short axis on image 60/4 (formerly 2.0 cm) maximum SUV 2.1 (formerly 2.4) this is currently Deauville 3 disease. Incidental CT findings: Right Port-A-Cath tip: SVC. Mild scarring or atelectasis in the left upper lobe adjacent to the major fissure, maximum SUV in this region 1.4. ABDOMEN/PELVIS: The previously described right external iliac lymph node has a short axis diameter of 0.8 cm (previously 0.9 cm) with maximum SUV measured at 1.9 (formerly 3.5). Currently compatible with Deauville 3 activity. No new or progressive adenopathy identified. No accentuated activity in the spleen, which measures 11.1 by 5.9 by 11.3 cm (volume = 390 cm^3), within normal limits. Incidental CT findings: none SKELETON: Diffuse accentuated activity in the skeleton, without well-defined focal lesions. This probably reflects granulocyte stimulation. Incidental CT findings: none IMPRESSION: 1. Significantly reduced size of left lower neck and mediastinal lymph nodes with roughly stable upper normal sized right external iliac lymph node. These continued to display Deauville 3 activity. 2. Diffuse accentuated activity in the skeleton, probably reflecting granulocyte stimulation. Electronically Signed   By: Van Clines M.D.   On: 04/28/2020 14:31     ASSESSMENT:  1. Stage IV AE classical Hodgkin's disease: -PET scan on 01/05/2020 shows hypermetabolic left cervical, supraclavicular, bilateral mediastinal adenopathy. Hypermetabolic porta hepatic lymph nodes. Mildly increased FDG uptake associated with spleen which is above liver activity.  Diffuse increase uptake in the axial and proximal appendicular skeleton. -IPI score of 3. -2 cycles of ABVD on 01/12/2020 on 02/09/2020. -PET2on 03/06/2010 shows left lower neck lymph node decreased in size and metabolic some. Measures 2 cm, previously 3.7 cm with SUV 1.9, previously 14.5. 1.6 cm left supraclavicular lymph node with SUV 3.0, previously 2.6 cm with SUV 9.8. Left prevascular lymph node decreased to 1.9 cm from 5.3 cm. Right prevascular lymph node decreased to 0.8 cm, previously 2.3 cm. Borderline prominent 0.9 cm right external iliac lymph node with mild hypermetabolism with maximum SUV 3.5 new. -The right external iliac lymph node is likely reactive. I have recommended continuing 2 more cycles of ABVD and repeat PET scan after cycle 4. -PET 4 on 04/28/2020 shows previously described right external iliac lymph node measures 0.8 cm, previously 0.9 cm with maximum SUV 1.9, previously 3.5.  Significantly reduced size of the left lower neck and mediastinal lymph nodes, Deauville 3 activity.   PLAN:  1. Stage IV classical Hodgkin's disease: -I have reviewed his labs today.  CBC and LFTs are adequate to proceed with day 15 cycle 5. -He will come back in 2 weeks to start day 1 cycle 6.  2. Peripheral neuropathy: -He has on and off numbness in the left hand and left forearm particularly when he is lying down.  This goes away on shaking of the hands.  This could be from vinblastine.  No other numbness in other extremities.  We will closely monitor.  3. Nausea/vomiting: -Occasional nausea well controlled with Compazine.   Orders placed this encounter:  No orders of the defined types were placed in this encounter.    Derek Jack, MD [facility] [phone]   I, Milinda Antis, am acting as a scribe for Dr. Sanda Linger.  I, Derek Jack MD, have reviewed the above documentation for accuracy and completeness, and I agree with the above.

## 2020-05-17 NOTE — Patient Instructions (Addendum)
[facility] Discharge Instructions for Patients Receiving Chemotherapy   Beginning January 23rd 2017 lab work for the [facility] will be done in the  Main lab at [facility] on 1st floor. If you have a lab appointment with the [facility] please come in thru the  Main Entrance and check in at the main information desk   Today you received the following chemotherapy agents Adriamycin,Vinblastin,DTIC and Bleomycin. Follow-up as scheduled  To help prevent nausea and vomiting after your treatment, we encourage you to take your nausea medication   If you develop nausea and vomiting, or diarrhea that is not controlled by your medication, call the clinic.  The clinic phone number is [phone]. Office hours are Monday-Friday 8:30am-5:00pm.  BELOW ARE SYMPTOMS THAT SHOULD BE REPORTED IMMEDIATELY:  *FEVER GREATER THAN 101.0 F  *CHILLS WITH OR WITHOUT FEVER  NAUSEA AND VOMITING THAT IS NOT CONTROLLED WITH YOUR NAUSEA MEDICATION  *UNUSUAL SHORTNESS OF BREATH  *UNUSUAL BRUISING OR BLEEDING  TENDERNESS IN MOUTH AND THROAT WITH OR WITHOUT PRESENCE OF ULCERS  *URINARY PROBLEMS  *BOWEL PROBLEMS  UNUSUAL RASH Items with * indicate a potential emergency and should be followed up as soon as possible. If you have an emergency after office hours please contact your primary care physician or go to the nearest emergency department.  Please call the clinic during office hours if you have any questions or concerns.   You may also contact the Patient Navigator at [phone] should you have any questions or need assistance in obtaining follow up care.      Resources For Cancer Patients and their Caregivers ? American Cancer Society: Can assist with transportation, wigs, general needs, runs Look Good Feel Better.        563-430-8196 ? Cancer Care: Provides financial assistance, online support groups, medication/co-pay assistance.  1-800-813-HOPE 901-249-1261) ? [facility] Assists [place] cancer patients and their families through emotional , educational and financial support.  9205158585 ? [place] DSS Where to apply for food stamps, Medicaid and utility assistance. (762) 800-1390 ? [facility]: Transportation to medical appointments. 346-223-3477 ? Social Security Administration: May apply for disability if have a Stage IV cancer. (939)638-6171 531 374 0366 ? [employer], Disability and Transit Services: Assists with nutrition, care and transit needs. [phone])474-6544

## 2020-05-17 NOTE — Progress Notes (Signed)
[id number] Labs reviewed with and pt seen by Dr. Delton Coombes and pt approved for AVBD treatment today per MD                                   Tyler [name] tolerated chemo tx well without complaints or incident. Positive blood return noted prior,during and after Adriamycin injection as well as prior to and after Vinblastine infusion. VSS upon discharge. Pt discharged self ambulatory in satisfactory condition

## 2020-05-19 ENCOUNTER — Encounter (HOSPITAL_COMMUNITY): Payer: Self-pay

## 2020-05-19 ENCOUNTER — Inpatient Hospital Stay (HOSPITAL_COMMUNITY): Payer: BC Managed Care – PPO

## 2020-05-19 ENCOUNTER — Other Ambulatory Visit: Payer: Self-pay

## 2020-05-19 VITALS — BP 128/66 | HR 63 | Temp 97.8°F | Resp 16

## 2020-05-19 DIAGNOSIS — Z5111 Encounter for antineoplastic chemotherapy: Secondary | ICD-10-CM | POA: Diagnosis not present

## 2020-05-19 DIAGNOSIS — C8118 Nodular sclerosis classical Hodgkin lymphoma, lymph nodes of multiple sites: Secondary | ICD-10-CM

## 2020-05-19 DIAGNOSIS — C8190 Hodgkin lymphoma, unspecified, unspecified site: Secondary | ICD-10-CM | POA: Diagnosis not present

## 2020-05-19 DIAGNOSIS — Z5189 Encounter for other specified aftercare: Secondary | ICD-10-CM | POA: Diagnosis not present

## 2020-05-19 DIAGNOSIS — R591 Generalized enlarged lymph nodes: Secondary | ICD-10-CM

## 2020-05-19 MED ORDER — HEPARIN SOD (PORK) LOCK FLUSH 100 UNIT/ML IV SOLN
500.0000 [IU] | Freq: Once | INTRAVENOUS | Status: AC
  Administered 2020-05-19: 500 [IU] via INTRAVENOUS

## 2020-05-19 MED ORDER — PEGFILGRASTIM-JMDB 6 MG/0.6ML ~~LOC~~ SOSY
6.0000 mg | PREFILLED_SYRINGE | Freq: Once | SUBCUTANEOUS | Status: AC
  Administered 2020-05-19: 6 mg via SUBCUTANEOUS
  Filled 2020-05-19: qty 0.6

## 2020-05-19 MED ORDER — SODIUM CHLORIDE 0.9% FLUSH
10.0000 mL | INTRAVENOUS | Status: DC | PRN
  Administered 2020-05-19: 10 mL via INTRAVENOUS

## 2020-05-19 MED ORDER — ONDANSETRON HCL 4 MG/2ML IJ SOLN
8.0000 mg | Freq: Once | INTRAMUSCULAR | Status: AC
  Administered 2020-05-19: 8 mg via INTRAVENOUS
  Filled 2020-05-19: qty 4

## 2020-05-19 MED ORDER — SODIUM CHLORIDE 0.9 % IV SOLN: Freq: Once | INTRAVENOUS | Status: AC

## 2020-05-19 NOTE — Progress Notes (Signed)
Pt here today for Fulphila injection. Pt given Fulphila in right upper abdomen. Pt tolerated injection well with no complaints. Pt states that he is nauseated and felt light headed prior to injection. Pt continued with nausea. Francene Finders gave verbal order for 59mL NS and Zofran IV. Pt given fluids and zofran per orders. Pt stable and discharged home ambulatory with girlfriend.

## 2020-05-31 ENCOUNTER — Inpatient Hospital Stay (HOSPITAL_BASED_OUTPATIENT_CLINIC_OR_DEPARTMENT_OTHER): Payer: BC Managed Care – PPO | Admitting: Hematology

## 2020-05-31 ENCOUNTER — Inpatient Hospital Stay (HOSPITAL_COMMUNITY): Payer: BC Managed Care – PPO

## 2020-05-31 ENCOUNTER — Other Ambulatory Visit: Payer: Self-pay

## 2020-05-31 ENCOUNTER — Inpatient Hospital Stay (HOSPITAL_COMMUNITY): Payer: BC Managed Care – PPO | Attending: Hematology

## 2020-05-31 VITALS — BP 152/82 | HR 84 | Temp 97.2°F | Resp 18 | Wt 262.6 lb

## 2020-05-31 VITALS — BP 135/68 | HR 80 | Temp 97.8°F | Resp 18

## 2020-05-31 DIAGNOSIS — C8190 Hodgkin lymphoma, unspecified, unspecified site: Secondary | ICD-10-CM | POA: Insufficient documentation

## 2020-05-31 DIAGNOSIS — Z5189 Encounter for other specified aftercare: Secondary | ICD-10-CM | POA: Diagnosis not present

## 2020-05-31 DIAGNOSIS — C8118 Nodular sclerosis classical Hodgkin lymphoma, lymph nodes of multiple sites: Secondary | ICD-10-CM

## 2020-05-31 DIAGNOSIS — Z5111 Encounter for antineoplastic chemotherapy: Secondary | ICD-10-CM | POA: Diagnosis not present

## 2020-05-31 LAB — CBC WITH DIFFERENTIAL/PLATELET
Abs Immature Granulocytes: 0.79 K/uL — ABNORMAL HIGH (ref 0.00–0.07)
Basophils Absolute: 0.1 K/uL (ref 0.0–0.1)
Basophils Relative: 1 %
Eosinophils Absolute: 0.2 K/uL (ref 0.0–0.5)
Eosinophils Relative: 1 %
HCT: 37.1 % — ABNORMAL LOW (ref 39.0–52.0)
Hemoglobin: 11.8 g/dL — ABNORMAL LOW (ref 13.0–17.0)
Immature Granulocytes: 7 %
Lymphocytes Relative: 13 %
Lymphs Abs: 1.6 K/uL (ref 0.7–4.0)
MCH: 29.4 pg (ref 26.0–34.0)
MCHC: 31.8 g/dL (ref 30.0–36.0)
MCV: 92.3 fL (ref 80.0–100.0)
Monocytes Absolute: 0.8 K/uL (ref 0.1–1.0)
Monocytes Relative: 6 %
Neutro Abs: 8.8 K/uL — ABNORMAL HIGH (ref 1.7–7.7)
Neutrophils Relative %: 72 %
Platelets: 237 K/uL (ref 150–400)
RBC: 4.02 MIL/uL — ABNORMAL LOW (ref 4.22–5.81)
RDW: 16.1 % — ABNORMAL HIGH (ref 11.5–15.5)
WBC: 12.2 K/uL — ABNORMAL HIGH (ref 4.0–10.5)
nRBC: 0 % (ref 0.0–0.2)

## 2020-05-31 LAB — LACTATE DEHYDROGENASE: LDH: 208 U/L — ABNORMAL HIGH (ref 98–192)

## 2020-05-31 LAB — COMPREHENSIVE METABOLIC PANEL
ALT: 32 U/L (ref 0–44)
AST: 15 U/L (ref 15–41)
Albumin: 3.9 g/dL (ref 3.5–5.0)
Alkaline Phosphatase: 87 U/L (ref 38–126)
Anion gap: 8 (ref 5–15)
BUN: 18 mg/dL (ref 6–20)
CO2: 25 mmol/L (ref 22–32)
Calcium: 8.9 mg/dL (ref 8.9–10.3)
Chloride: 104 mmol/L (ref 98–111)
Creatinine, Ser: 0.92 mg/dL (ref 0.61–1.24)
GFR calc Af Amer: >60 mL/min (ref >60)
GFR calc non Af Amer: >60 mL/min (ref >60)
Glucose, Bld: 133 mg/dL — ABNORMAL HIGH (ref 70–99)
Potassium: 4 mmol/L (ref 3.5–5.1)
Sodium: 137 mmol/L (ref 135–145)
Total Bilirubin: 0.2 mg/dL — ABNORMAL LOW (ref 0.3–1.2)
Total Protein: 6.6 g/dL (ref 6.5–8.1)

## 2020-05-31 LAB — MAGNESIUM: Magnesium: 1.9 mg/dL (ref 1.7–2.4)

## 2020-05-31 LAB — PHOSPHORUS: Phosphorus: 3.8 mg/dL (ref 2.5–4.6)

## 2020-05-31 LAB — URIC ACID: Uric Acid, Serum: 7.7 mg/dL (ref 3.7–8.6)

## 2020-05-31 MED ORDER — SODIUM CHLORIDE 0.9 % IV SOLN
150.0000 mg | Freq: Once | INTRAVENOUS | Status: AC
  Administered 2020-05-31: 150 mg via INTRAVENOUS
  Filled 2020-05-31: qty 150

## 2020-05-31 MED ORDER — DOXORUBICIN HCL CHEMO IV INJECTION 2 MG/ML
25.0000 mg/m2 | Freq: Once | INTRAVENOUS | Status: AC
  Administered 2020-05-31: 62 mg via INTRAVENOUS
  Filled 2020-05-31: qty 31

## 2020-05-31 MED ORDER — VINBLASTINE SULFATE CHEMO INJECTION 1 MG/ML
15.0000 mg | Freq: Once | INTRAVENOUS | Status: AC
  Administered 2020-05-31: 15 mg via INTRAVENOUS
  Filled 2020-05-31: qty 15

## 2020-05-31 MED ORDER — SODIUM CHLORIDE 0.9 % IV SOLN
10.0000 mg | Freq: Once | INTRAVENOUS | Status: AC
  Administered 2020-05-31: 10 mg via INTRAVENOUS
  Filled 2020-05-31: qty 10

## 2020-05-31 MED ORDER — PALONOSETRON HCL INJECTION 0.25 MG/5ML
0.2500 mg | Freq: Once | INTRAVENOUS | Status: AC
  Administered 2020-05-31: 0.25 mg via INTRAVENOUS
  Filled 2020-05-31: qty 5

## 2020-05-31 MED ORDER — ONDANSETRON HCL 8 MG PO TABS
8.0000 mg | ORAL_TABLET | Freq: Three times a day (TID) | ORAL | 0 refills | Status: DC | PRN
Start: 2020-05-31 — End: 2021-05-29

## 2020-05-31 MED ORDER — SODIUM CHLORIDE 0.9 % IV SOLN
375.0000 mg/m2 | Freq: Once | INTRAVENOUS | Status: AC
  Administered 2020-05-31: 920 mg via INTRAVENOUS
  Filled 2020-05-31: qty 92

## 2020-05-31 MED ORDER — HEPARIN SOD (PORK) LOCK FLUSH 100 UNIT/ML IV SOLN
500.0000 [IU] | Freq: Once | INTRAVENOUS | Status: AC | PRN
  Administered 2020-05-31: 500 [IU]

## 2020-05-31 MED ORDER — SODIUM CHLORIDE 0.9 % IV SOLN: Freq: Once | INTRAVENOUS | Status: AC

## 2020-05-31 MED ORDER — SODIUM CHLORIDE 0.9 % IV SOLN
10.0000 [IU]/m2 | Freq: Once | INTRAVENOUS | Status: AC
  Administered 2020-05-31: 25 [IU] via INTRAVENOUS
  Filled 2020-05-31: qty 8.33

## 2020-05-31 MED ORDER — SODIUM CHLORIDE 0.9% FLUSH: 10.0000 mL | INTRAVENOUS | Status: DC | PRN

## 2020-05-31 NOTE — Patient Instructions (Signed)
[facility] Discharge Instructions for Patients Receiving Chemotherapy  Today you received the following chemotherapy agents   To help prevent nausea and vomiting after your treatment, we encourage you to take your nausea medication   If you develop nausea and vomiting that is not controlled by your nausea medication, call the clinic.   BELOW ARE SYMPTOMS THAT SHOULD BE REPORTED IMMEDIATELY:  *FEVER GREATER THAN 100.5 F  *CHILLS WITH OR WITHOUT FEVER  NAUSEA AND VOMITING THAT IS NOT CONTROLLED WITH YOUR NAUSEA MEDICATION  *UNUSUAL SHORTNESS OF BREATH  *UNUSUAL BRUISING OR BLEEDING  TENDERNESS IN MOUTH AND THROAT WITH OR WITHOUT PRESENCE OF ULCERS  *URINARY PROBLEMS  *BOWEL PROBLEMS  UNUSUAL RASH Items with * indicate a potential emergency and should be followed up as soon as possible.  Feel free to call the clinic should you have any questions or concerns. The clinic phone number is [phone].  Please show the CHEMO ALERT CARD at check-in to the Emergency Department and triage nurse.   

## 2020-05-31 NOTE — Progress Notes (Signed)
Tyler [name],  [id number]   CLINIC:  Medical Oncology/Hematology  PCP:  Mikey Kirschner, MD (Inactive) None None   REASON FOR VISIT:  Follow-up for Hodgkin's disease  PRIOR THERAPY: None  NGS Results: Not done  CURRENT THERAPY: ABVD  BRIEF ONCOLOGIC HISTORY:  Oncology History  Hodgkin's disease ([facility])  01/06/2020 Initial Diagnosis   Hodgkin's disease ([place])   01/12/2020 -  Chemotherapy   The patient had DOXOrubicin (ADRIAMYCIN) chemo injection 62 mg, 25 mg/m2 = 62 mg, Intravenous,  Once, 5 of 6 cycles Administration: 62 mg (01/12/2020), 62 mg (01/26/2020), 62 mg (02/09/2020), 62 mg (02/23/2020), 62 mg (03/08/2020), 62 mg (03/22/2020), 62 mg (04/05/2020), 62 mg (04/19/2020), 62 mg (05/03/2020), 62 mg (05/17/2020) palonosetron (ALOXI) injection 0.25 mg, 0.25 mg, Intravenous,  Once, 5 of 6 cycles Administration: 0.25 mg (01/12/2020), 0.25 mg (01/26/2020), 0.25 mg (02/09/2020), 0.25 mg (02/23/2020), 0.25 mg (03/08/2020), 0.25 mg (03/22/2020), 0.25 mg (04/05/2020), 0.25 mg (04/19/2020), 0.25 mg (05/03/2020), 0.25 mg (05/17/2020) bleomycin (BLEOCIN) 25 Units in sodium chloride 0.9 % 50 mL chemo infusion, 10 Units/m2 = 25 Units, Intravenous,  Once, 5 of 6 cycles Administration: 25 Units (01/12/2020), 25 Units (01/26/2020), 25 Units (02/09/2020), 25 Units (02/23/2020), 25 Units (03/08/2020), 25 Units (03/22/2020), 25 Units (04/05/2020), 25 Units (04/19/2020), 25 Units (05/03/2020), 25 Units (05/17/2020) dacarbazine (DTIC) 920 mg in sodium chloride 0.9 % 250 mL chemo infusion, 375 mg/m2 = 920 mg, Intravenous,  Once, 5 of 6 cycles Administration: 920 mg (01/12/2020), 920 mg (01/26/2020), 920 mg (02/09/2020), 920 mg (02/23/2020), 920 mg (03/08/2020), 920 mg (03/22/2020), 920 mg (04/05/2020), 920 mg (04/19/2020), 920 mg (05/03/2020), 920 mg (05/17/2020) fosaprepitant (EMEND) 150 mg in sodium chloride 0.9 % 145 mL IVPB, 150 mg, Intravenous,  Once, 5 of 6 cycles Administration: 150 mg (01/12/2020), 150  mg (01/26/2020), 150 mg (02/09/2020), 150 mg (02/23/2020), 150 mg (03/08/2020), 150 mg (03/22/2020), 150 mg (04/05/2020), 150 mg (04/19/2020), 150 mg (05/03/2020), 150 mg (05/17/2020) vinBLAStine (VELBAN) 15 mg in sodium chloride 0.9 % 50 mL chemo infusion, 6.1 mg/m2 = 14.8 mg, Intravenous, Once, 5 of 6 cycles Administration: 15 mg (01/12/2020), 15 mg (01/26/2020), 15 mg (02/09/2020), 15 mg (02/23/2020), 15 mg (03/08/2020), 15 mg (03/22/2020), 15 mg (04/05/2020), 15 mg (04/19/2020), 15 mg (05/03/2020), 15 mg (05/17/2020)  for chemotherapy treatment.      CANCER STAGING: Cancer Staging Hodgkin's disease [facility]) Staging form: Hodgkin and Non-Hodgkin Lymphoma, AJCC 8th Edition - Clinical stage from 01/06/2020: Stage IV (Hodgkin lymphoma, A - Asymptomatic) - Unsigned   INTERVAL HISTORY:  [name], a 24 y.o. male, returns for routine follow-up and consideration for next cycle of chemotherapy. Jo was last seen on 05/17/2020.  Due for cycle #6 of ABVD today.   Overall, he tells me he has been feeling pretty well. He reports that he got light-headed, dizzy, hot and sweaty during the previous pegfilgrastim and improved with IVF. His numbness in both hands is stable and his hands still get "tight" if he holds them down by his side and has to elevate them to improve feeling. He continues having nausea for 4-5 days after chemo and takes Compazine along with snacking, but denies vomiting.  Overall, he feels ready for next cycle of chemo today.    REVIEW OF SYSTEMS:  Review of Systems  Constitutional: Negative for appetite change and fatigue.  Skin: Negative for rash.  Neurological: Positive for dizziness, headaches and numbness (w/ treatment).  All other systems reviewed and  are negative.   PAST MEDICAL/SURGICAL HISTORY:  Past Medical History:  Diagnosis Date  . ADD (attention deficit disorder)    Inattention type  . Cyclic vomiting syndrome 2009  . GERD (gastroesophageal reflux disease)   .  Hypertension    Past Surgical History:  Procedure Laterality Date  . closed reduction rt. wrist    . IR IMAGING GUIDED PORT INSERTION  12/31/2019  . MASS BIOPSY Left 01/03/2020   Procedure: OPEN NECK BIOPSY;  Surgeon: Leta Baptist, MD;  Location: [facility];  Service: ENT;  Laterality: Left;    SOCIAL HISTORY:  Social History   Socioeconomic History  . Marital status: Single    Spouse name: Not on file  . Number of children: 0  . Years of education: Not on file  . Highest education level: Not on file  Occupational History  . Not on file  Tobacco Use  . Smoking status: Never Smoker  . Smokeless tobacco: Former Network engineer  . Vaping Use: Never used  Substance and Sexual Activity  . Alcohol use: Never  . Drug use: Never  . Sexual activity: Not Currently  Other Topics Concern  . Not on file  Social History Narrative  . Not on file   Social Determinants of Health   Financial Resource Strain: Low Risk   . Difficulty of Paying Living Expenses: Not hard at all  Food Insecurity: No Food Insecurity  . Worried About Charity fundraiser in the Last Year: Never true  . Ran Out of Food in the Last Year: Never true  Transportation Needs: No Transportation Needs  . Lack of Transportation (Medical): No  . Lack of Transportation (Non-Medical): No  Physical Activity: Inactive  . Days of Exercise per Week: 0 days  . Minutes of Exercise per Session: 0 min  Stress: No Stress Concern Present  . Feeling of Stress : Not at all  Social Connections: Moderately Isolated  . Frequency of Communication with Friends and Family: More than three times a week  . Frequency of Social Gatherings with Friends and Family: More than three times a week  . Attends Religious Services: More than 4 times per year  . Active Member of Clubs or Organizations: No  . Attends Archivist Meetings: Never  . Marital Status: Never married  Intimate Partner Violence: Not At Risk  . Fear of  Current or Ex-Partner: No  . Emotionally Abused: No  . Physically Abused: No  . Sexually Abused: No    FAMILY HISTORY:  Family History  Problem Relation Age of Onset  . Cancer Paternal Grandmother   . Hypertension Mother   . Hypertension Father     CURRENT MEDICATIONS:  Current Outpatient Medications  Medication Sig Dispense Refill  . acetaminophen (TYLENOL) 325 MG tablet Take 650 mg by mouth 2 (two) times daily as needed for moderate pain or fever.  (Patient not taking: Reported on 05/17/2020)    . amLODipine (NORVASC) 10 MG tablet Take 1 tablet by mouth once daily 90 tablet 0  . bleomycin in sodium chloride 0.9 % 50 mL Inject into the vein every 14 (fourteen) days. Day 1, 15 every 28 days    . DACARBAZINE IV Inject into the vein every 14 (fourteen) days. Day 1, 15 every 28 days    . DOXORUBICIN HCL IV Inject into the vein every 14 (fourteen) days. Day 1, 15 every 28 days    . lidocaine-prilocaine (EMLA) cream Apply small amount over  port site and cover with plastic wrap 1 hour before appointment. (Patient not taking: Reported on 05/17/2020) 30 g 3  . loratadine (CLARITIN) 10 MG tablet Take 10 mg by mouth daily.    Marland Kitchen omeprazole (PRILOSEC OTC) 20 MG tablet Take 1 tablet (20 mg total) by mouth daily. 90 tablet 1  . prochlorperazine (COMPAZINE) 10 MG tablet Take 1 tablet (10 mg total) by mouth every 6 (six) hours as needed (Nausea or vomiting). (Patient not taking: Reported on 05/17/2020) 45 tablet 2  . sodium chloride (OCEAN) 0.65 % SOLN nasal spray Place 1 spray into both nostrils as needed for congestion.    . vinBLAStine in sodium chloride 0.9 % 50 mL Inject into the vein. Day 1, 15 every 28 days     No current facility-administered medications for this visit.   Facility-Administered Medications Ordered in Other Visits  Medication Dose Route Frequency Provider Last Rate Last Admin  . sodium chloride flush (NS) 0.9 % injection 10 mL  10 mL Intravenous PRN Derek Jack, MD   10  mL at 05/19/20 1033    ALLERGIES:  Allergies  Allergen Reactions  . Ace Inhibitors Swelling    PHYSICAL EXAM:  Performance status (ECOG): 1 - Symptomatic but completely ambulatory  Vitals:   05/31/20 0756  BP: (!) 152/82  Pulse: 84  Resp: 18  Temp: (!) 97.2 F (36.2 C)  SpO2: 99%   Wt Readings from Last 3 Encounters:  05/31/20 262 lb 9.6 oz (119.1 kg)  05/17/20 263 lb 3.2 oz (119.4 kg)  05/03/20 262 lb (118.8 kg)   Physical Exam Vitals reviewed.  Constitutional:      Appearance: Normal appearance. He is obese.  Cardiovascular:     Rate and Rhythm: Normal rate and regular rhythm.     Pulses: Normal pulses.     Heart sounds: Normal heart sounds.  Pulmonary:     Effort: Pulmonary effort is normal.     Breath sounds: Normal breath sounds.  Neurological:     General: No focal deficit present.     Mental Status: He is alert and oriented to person, place, and time.  Psychiatric:        Mood and Affect: Mood normal.        Behavior: Behavior normal.     LABORATORY DATA:  I have reviewed the labs as listed.  CBC Latest Ref Rng & Units 05/17/2020 05/03/2020 04/19/2020  WBC 4.0 - 10.5 K/uL 10.9(H) 10.3 8.6  Hemoglobin 13.0 - 17.0 g/dL 12.1(L) 12.1(L) 12.0(L)  Hematocrit 39 - 52 % 37.2(L) 38.1(L) 37.7(L)  Platelets 150 - 400 K/uL 239 261 249   CMP Latest Ref Rng & Units 05/17/2020 05/03/2020 04/19/2020  Glucose 70 - 99 mg/dL 126(H) 147(H) 126(H)  BUN 6 - 20 mg/dL 16 17 15   Creatinine 0.61 - 1.24 mg/dL 0.95 0.92 0.92  Sodium 135 - 145 mmol/L 137 134(L) 138  Potassium 3.5 - 5.1 mmol/L 4.0 3.9 3.9  Chloride 98 - 111 mmol/L 104 101 103  CO2 22 - 32 mmol/L 23 25 26   Calcium 8.9 - 10.3 mg/dL 8.5(L) 8.7(L) 8.8(L)  Total Protein 6.5 - 8.1 g/dL 6.6 6.9 6.7  Total Bilirubin 0.3 - 1.2 mg/dL 0.6 0.5 0.4  Alkaline Phos 38 - 126 U/L 86 82 83  AST 15 - 41 U/L 14(L) 16 15  ALT 0 - 44 U/L 30 28 31    Lab Results  Component Value Date   LDH 203 (H) 05/17/2020   LDH 214 (  H) 05/03/2020    LDH 192 04/19/2020    DIAGNOSTIC IMAGING:  I have independently reviewed the scans and discussed with the patient. No results found.   ASSESSMENT:  1. Stage IV AE classical Hodgkin's disease: -PET scan on 01/05/2020 shows hypermetabolic left cervical, supraclavicular, bilateral mediastinal adenopathy. Hypermetabolic porta hepatic lymph nodes. Mildly increased FDG uptake associated with spleen which is above liver activity. Diffuse increase uptake in the axial and proximal appendicular skeleton. -IPI score of 3. -2 cycles of ABVD on 01/12/2020 on 02/09/2020. -PET2on 03/06/2010 shows left lower neck lymph node decreased in size and metabolic some. Measures 2 cm, previously 3.7 cm with SUV 1.9, previously 14.5. 1.6 cm left supraclavicular lymph node with SUV 3.0, previously 2.6 cm with SUV 9.8. Left prevascular lymph node decreased to 1.9 cm from 5.3 cm. Right prevascular lymph node decreased to 0.8 cm, previously 2.3 cm. Borderline prominent 0.9 cm right external iliac lymph node with mild hypermetabolism with maximum SUV 3.5 new. -The right external iliac lymph node is likely reactive. I have recommended continuing 2 more cycles of ABVD and repeat PET scan after cycle 4. -PET4on 04/28/2020 shows previously described right external iliac lymph node measures 0.8 cm, previously 0.9 cm with maximum SUV 1.9, previously 3.5. Significantly reduced size of the left lower neck and mediastinal lymph nodes, Deauville 3 activity.   PLAN:  1. Stage IV classical Hodgkin's disease: -I have reviewed his labs today.  LFTs are normal.  CBC is adequate to proceed with cycle 6-day 1 today. -He had episode of near syncope 2 days after last cycle of chemo.  I will schedule him for IV fluids on Friday when he comes for Neulasta injection.  I will reevaluate him in 2 weeks.  I plan to scan after cycle 6.  2. Peripheral neuropathy: -He has on and off numbness in both hands.  Denies dropping things.  This  improves on movement.  3. Nausea/vomiting: -Compazine is not completely helping. -We will send Zofran 8 mg to be taken every 8 hours as needed.   Orders placed this encounter:  No orders of the defined types were placed in this encounter.    Derek Jack, MD [place] [phone]   I, Milinda Antis, am acting as a scribe for Dr. Sanda Linger.  I, Derek Jack MD, have reviewed the above documentation for accuracy and completeness, and I agree with the above.

## 2020-05-31 NOTE — Progress Notes (Signed)
Labs reviewed today at office visit. Will proceed with treatment as planned per MD.   Treatment given per orders. Patient tolerated it well without problems. Vitals stable and discharged home from clinic ambulatory. Follow up as scheduled.

## 2020-05-31 NOTE — Patient Instructions (Signed)
[place] at [facility] Discharge Instructions  You were seen today by Dr. Delton Coombes. He went over your recent results. You received your treatment today. You will be prescribed ondansetron for your nausea if the Compazine does not help. Dr. Delton Coombes will see you back in 2 weeks for labs and follow up.   Thank you for choosing [place] at [facility] to provide your oncology and hematology care.  To afford each patient quality time with our provider, please arrive at least 15 minutes before your scheduled appointment time.   If you have a lab appointment with the [facility] please come in thru the Main Entrance and check in at the main information desk  You need to re-schedule your appointment should you arrive 10 or more minutes late.  We strive to give you quality time with our providers, and arriving late affects you and other patients whose appointments are after yours.  Also, if you no show three or more times for appointments you may be dismissed from the clinic at the providers discretion.     Again, thank you for choosing [facility].  Our hope is that these requests will decrease the amount of time that you wait before being seen by our physicians.       _____________________________________________________________  Should you have questions after your visit to [facility], please contact our office at [phone] between the hours of 8:00 a.m. and 4:30 p.m.  Voicemails left after 4:00 p.m. will not be returned until the following business day.  For prescription refill requests, have your pharmacy contact our office and allow 72 hours.    Cancer Center Support Programs:   > Cancer Support Group  2nd Tuesday of the month 1pm-2pm, Journey Room

## 2020-06-01 ENCOUNTER — Ambulatory Visit (HOSPITAL_COMMUNITY): Payer: BC Managed Care – PPO

## 2020-06-02 ENCOUNTER — Other Ambulatory Visit: Payer: Self-pay

## 2020-06-02 ENCOUNTER — Inpatient Hospital Stay (HOSPITAL_COMMUNITY): Payer: BC Managed Care – PPO

## 2020-06-02 ENCOUNTER — Ambulatory Visit (HOSPITAL_COMMUNITY): Payer: BC Managed Care – PPO

## 2020-06-02 VITALS — BP 119/61 | HR 78 | Temp 97.3°F | Resp 18

## 2020-06-02 DIAGNOSIS — C8190 Hodgkin lymphoma, unspecified, unspecified site: Secondary | ICD-10-CM | POA: Diagnosis not present

## 2020-06-02 DIAGNOSIS — C8118 Nodular sclerosis classical Hodgkin lymphoma, lymph nodes of multiple sites: Secondary | ICD-10-CM

## 2020-06-02 DIAGNOSIS — Z5111 Encounter for antineoplastic chemotherapy: Secondary | ICD-10-CM | POA: Diagnosis not present

## 2020-06-02 DIAGNOSIS — Z5189 Encounter for other specified aftercare: Secondary | ICD-10-CM | POA: Diagnosis not present

## 2020-06-02 MED ORDER — ONDANSETRON HCL 4 MG/2ML IJ SOLN: 8.0000 mg | Freq: Once | INTRAMUSCULAR | Status: DC

## 2020-06-02 MED ORDER — PEGFILGRASTIM-JMDB 6 MG/0.6ML ~~LOC~~ SOSY
6.0000 mg | PREFILLED_SYRINGE | Freq: Once | SUBCUTANEOUS | Status: AC
  Administered 2020-06-02: 6 mg via SUBCUTANEOUS

## 2020-06-02 MED ORDER — HEPARIN SOD (PORK) LOCK FLUSH 100 UNIT/ML IV SOLN
500.0000 [IU] | Freq: Once | INTRAVENOUS | Status: AC
  Administered 2020-06-02: 500 [IU] via INTRAVENOUS

## 2020-06-02 MED ORDER — SODIUM CHLORIDE 0.9 % IV SOLN
Freq: Once | INTRAVENOUS | Status: AC
  Administered 2020-06-02: 8 mg via INTRAVENOUS
  Filled 2020-06-02: qty 4

## 2020-06-02 MED ORDER — SODIUM CHLORIDE 0.9 % IV SOLN: Freq: Once | INTRAVENOUS | Status: AC

## 2020-06-02 MED ORDER — SODIUM CHLORIDE 0.9% FLUSH
10.0000 mL | Freq: Once | INTRAVENOUS | Status: AC
  Administered 2020-06-02: 10 mL

## 2020-06-02 NOTE — Progress Notes (Signed)
Patient presents today for Fulphila injection, 524mls of NS and 8mg  of Zofran IV. Verbal orders received from [facility] NP.   Tyler [name] presents today for injection per MD orders. Fulphila administered SQ in left Abdomen. Administration without incident. Patient tolerated well.   555mls of Normal Saline infused today per [facility] NP orders. Tolerated infusion without adverse affects. Vital signs stable. No complaints at this time. Discharged from clinic ambulatory. F/U with [facility] as scheduled.

## 2020-06-02 NOTE — Patient Instructions (Signed)
[facility] at [facility]  Discharge Instructions:   _______________________________________________________________  Thank you for choosing [facility] at [facility] to provide your oncology and hematology care.  To afford each patient quality time with our providers, please arrive at least 15 minutes before your scheduled appointment.  You need to re-schedule your appointment if you arrive 10 or more minutes late.  We strive to give you quality time with our providers, and arriving late affects you and other patients whose appointments are after yours.  Also, if you no show three or more times for appointments you may be dismissed from the clinic.  Again, thank you for choosing [facility] at [facility]. Our hope is that these requests will allow you access to exceptional care and in a timely manner. _______________________________________________________________  If you have questions after your visit, please contact our office at [phone] between the hours of 8:30 a.m. and 5:00 p.m. Voicemails left after 4:30 p.m. will not be returned until the following business day. _______________________________________________________________  For prescription refill requests, have your pharmacy contact our office. _______________________________________________________________  Recommendations made by the consultant and any test results will be sent to your referring physician. _______________________________________________________________ 

## 2020-06-09 ENCOUNTER — Encounter: Payer: Self-pay | Admitting: Family Medicine

## 2020-06-09 ENCOUNTER — Other Ambulatory Visit: Payer: Self-pay

## 2020-06-09 ENCOUNTER — Ambulatory Visit: Payer: BC Managed Care – PPO | Admitting: Family Medicine

## 2020-06-09 VITALS — BP 118/76 | HR 104 | Temp 97.8°F | Wt 262.4 lb

## 2020-06-09 DIAGNOSIS — L02415 Cutaneous abscess of right lower limb: Secondary | ICD-10-CM

## 2020-06-09 DIAGNOSIS — C8118 Nodular sclerosis classical Hodgkin lymphoma, lymph nodes of multiple sites: Secondary | ICD-10-CM

## 2020-06-09 DIAGNOSIS — I10 Essential (primary) hypertension: Secondary | ICD-10-CM | POA: Diagnosis not present

## 2020-06-09 MED ORDER — CEPHALEXIN 500 MG PO CAPS
500.0000 mg | ORAL_CAPSULE | Freq: Two times a day (BID) | ORAL | 0 refills | Status: DC
Start: 2020-06-09 — End: 2020-10-13

## 2020-06-09 MED ORDER — AMLODIPINE BESYLATE 10 MG PO TABS: 10.0000 mg | ORAL_TABLET | Freq: Every day | ORAL | 1 refills | Status: DC

## 2020-06-14 ENCOUNTER — Inpatient Hospital Stay (HOSPITAL_COMMUNITY): Payer: BC Managed Care – PPO

## 2020-06-14 ENCOUNTER — Other Ambulatory Visit: Payer: Self-pay

## 2020-06-14 ENCOUNTER — Inpatient Hospital Stay (HOSPITAL_COMMUNITY): Payer: BC Managed Care – PPO | Admitting: Hematology

## 2020-06-14 VITALS — BP 135/64 | HR 71 | Temp 97.3°F | Resp 17

## 2020-06-14 VITALS — BP 155/96 | HR 86 | Temp 97.2°F | Resp 18 | Wt 261.9 lb

## 2020-06-14 DIAGNOSIS — Z5189 Encounter for other specified aftercare: Secondary | ICD-10-CM | POA: Diagnosis not present

## 2020-06-14 DIAGNOSIS — C8118 Nodular sclerosis classical Hodgkin lymphoma, lymph nodes of multiple sites: Secondary | ICD-10-CM

## 2020-06-14 DIAGNOSIS — Z5111 Encounter for antineoplastic chemotherapy: Secondary | ICD-10-CM | POA: Diagnosis not present

## 2020-06-14 DIAGNOSIS — C8190 Hodgkin lymphoma, unspecified, unspecified site: Secondary | ICD-10-CM | POA: Diagnosis not present

## 2020-06-14 LAB — CBC WITH DIFFERENTIAL/PLATELET
Abs Immature Granulocytes: 0.48 10*3/uL — ABNORMAL HIGH (ref 0.00–0.07)
Basophils Absolute: 0.1 10*3/uL (ref 0.0–0.1)
Basophils Relative: 1 %
Eosinophils Absolute: 0.2 10*3/uL (ref 0.0–0.5)
Eosinophils Relative: 2 %
HCT: 37.7 % — ABNORMAL LOW (ref 39.0–52.0)
Hemoglobin: 12 g/dL — ABNORMAL LOW (ref 13.0–17.0)
Immature Granulocytes: 5 %
Lymphocytes Relative: 16 %
Lymphs Abs: 1.5 10*3/uL (ref 0.7–4.0)
MCH: 28.8 pg (ref 26.0–34.0)
MCHC: 31.8 g/dL (ref 30.0–36.0)
MCV: 90.6 fL (ref 80.0–100.0)
Monocytes Absolute: 0.8 10*3/uL (ref 0.1–1.0)
Monocytes Relative: 8 %
Neutro Abs: 6.8 10*3/uL (ref 1.7–7.7)
Neutrophils Relative %: 68 %
Platelets: 260 10*3/uL (ref 150–400)
RBC: 4.16 MIL/uL — ABNORMAL LOW (ref 4.22–5.81)
RDW: 16.2 % — ABNORMAL HIGH (ref 11.5–15.5)
WBC: 9.9 10*3/uL (ref 4.0–10.5)
nRBC: 0 % (ref 0.0–0.2)

## 2020-06-14 LAB — MAGNESIUM: Magnesium: 2.1 mg/dL (ref 1.7–2.4)

## 2020-06-14 LAB — COMPREHENSIVE METABOLIC PANEL
ALT: 33 U/L (ref 0–44)
AST: 16 U/L (ref 15–41)
Albumin: 3.8 g/dL (ref 3.5–5.0)
Alkaline Phosphatase: 81 U/L (ref 38–126)
Anion gap: 7 (ref 5–15)
BUN: 14 mg/dL (ref 6–20)
CO2: 24 mmol/L (ref 22–32)
Calcium: 8.6 mg/dL — ABNORMAL LOW (ref 8.9–10.3)
Chloride: 105 mmol/L (ref 98–111)
Creatinine, Ser: 0.83 mg/dL (ref 0.61–1.24)
GFR calc Af Amer: >60 mL/min (ref >60)
GFR calc non Af Amer: >60 mL/min (ref >60)
Glucose, Bld: 113 mg/dL — ABNORMAL HIGH (ref 70–99)
Potassium: 3.9 mmol/L (ref 3.5–5.1)
Sodium: 136 mmol/L (ref 135–145)
Total Bilirubin: 0.3 mg/dL (ref 0.3–1.2)
Total Protein: 6.7 g/dL (ref 6.5–8.1)

## 2020-06-14 LAB — LACTATE DEHYDROGENASE: LDH: 207 U/L — ABNORMAL HIGH (ref 98–192)

## 2020-06-14 LAB — PHOSPHORUS: Phosphorus: 3.5 mg/dL (ref 2.5–4.6)

## 2020-06-14 LAB — URIC ACID: Uric Acid, Serum: 7.5 mg/dL (ref 3.7–8.6)

## 2020-06-14 MED ORDER — DOXORUBICIN HCL CHEMO IV INJECTION 2 MG/ML
25.0000 mg/m2 | Freq: Once | INTRAVENOUS | Status: AC
  Administered 2020-06-14: 62 mg via INTRAVENOUS
  Filled 2020-06-14: qty 31

## 2020-06-14 MED ORDER — SODIUM CHLORIDE 0.9 % IV SOLN
375.0000 mg/m2 | Freq: Once | INTRAVENOUS | Status: AC
  Administered 2020-06-14: 920 mg via INTRAVENOUS
  Filled 2020-06-14: qty 92

## 2020-06-14 MED ORDER — PALONOSETRON HCL INJECTION 0.25 MG/5ML
0.2500 mg | Freq: Once | INTRAVENOUS | Status: AC
  Administered 2020-06-14: 0.25 mg via INTRAVENOUS
  Filled 2020-06-14: qty 5

## 2020-06-14 MED ORDER — SODIUM CHLORIDE 0.9% FLUSH
10.0000 mL | INTRAVENOUS | Status: DC | PRN
  Administered 2020-06-14: 10 mL

## 2020-06-14 MED ORDER — HEPARIN SOD (PORK) LOCK FLUSH 100 UNIT/ML IV SOLN
500.0000 [IU] | Freq: Once | INTRAVENOUS | Status: AC | PRN
  Administered 2020-06-14: 500 [IU]

## 2020-06-14 MED ORDER — VINBLASTINE SULFATE CHEMO INJECTION 1 MG/ML
6.1000 mg/m2 | Freq: Once | INTRAVENOUS | Status: AC
  Administered 2020-06-14: 15 mg via INTRAVENOUS
  Filled 2020-06-14: qty 15

## 2020-06-14 MED ORDER — SODIUM CHLORIDE 0.9 % IV SOLN
10.0000 mg | Freq: Once | INTRAVENOUS | Status: AC
  Administered 2020-06-14: 10 mg via INTRAVENOUS
  Filled 2020-06-14: qty 10

## 2020-06-14 MED ORDER — SODIUM CHLORIDE 0.9 % IV SOLN: Freq: Once | INTRAVENOUS | Status: AC

## 2020-06-14 MED ORDER — SODIUM CHLORIDE 0.9 % IV SOLN
10.0000 [IU]/m2 | Freq: Once | INTRAVENOUS | Status: AC
  Administered 2020-06-14: 25 [IU] via INTRAVENOUS
  Filled 2020-06-14: qty 8.33

## 2020-06-14 MED ORDER — SODIUM CHLORIDE 0.9 % IV SOLN
150.0000 mg | Freq: Once | INTRAVENOUS | Status: AC
  Administered 2020-06-14: 150 mg via INTRAVENOUS
  Filled 2020-06-14: qty 150

## 2020-06-14 NOTE — Progress Notes (Signed)
Patient has been assessed by Dr. Delton Coombes and labs reviewed. He is okay to proceed with treatment today.

## 2020-06-14 NOTE — Progress Notes (Signed)
Patient tolerated chemotherapy with no complaints voiced.  Side effects with management reviewed with understanding verbalized.  Good blood return noted before, during, and after vesicants.  Port site clean and dry with no bruising or swelling noted at site.  Good blood return noted before and after administration of chemotherapy.  Band aid applied.  Patient left in satisfactory condition with VSS and no s/s of distress noted.

## 2020-06-14 NOTE — Progress Notes (Signed)
[place], [place] [address]   CLINIC:  Medical Oncology/Hematology  PCP:  Erven Colla, DO [address] / [place] [address] [phone]   REASON FOR VISIT:  Follow-up for Hodgkin's disease  PRIOR THERAPY: None  NGS Results: Not done  CURRENT THERAPY: ABVD  BRIEF ONCOLOGIC HISTORY:  Oncology History  Hodgkin's disease ([place])  01/06/2020 Initial Diagnosis   Hodgkin's disease ([place])   01/12/2020 -  Chemotherapy   The patient had DOXOrubicin (ADRIAMYCIN) chemo injection 62 mg, 25 mg/m2 = 62 mg, Intravenous,  Once, 6 of 6 cycles Administration: 62 mg (01/12/2020), 62 mg (01/26/2020), 62 mg (02/09/2020), 62 mg (02/23/2020), 62 mg (03/08/2020), 62 mg (03/22/2020), 62 mg (04/05/2020), 62 mg (04/19/2020), 62 mg (05/03/2020), 62 mg (05/17/2020), 62 mg (05/31/2020) palonosetron (ALOXI) injection 0.25 mg, 0.25 mg, Intravenous,  Once, 6 of 6 cycles Administration: 0.25 mg (01/12/2020), 0.25 mg (01/26/2020), 0.25 mg (02/09/2020), 0.25 mg (02/23/2020), 0.25 mg (03/08/2020), 0.25 mg (03/22/2020), 0.25 mg (04/05/2020), 0.25 mg (04/19/2020), 0.25 mg (05/03/2020), 0.25 mg (05/17/2020), 0.25 mg (05/31/2020) bleomycin (BLEOCIN) 25 Units in sodium chloride 0.9 % 50 mL chemo infusion, 10 Units/m2 = 25 Units, Intravenous,  Once, 6 of 6 cycles Administration: 25 Units (01/12/2020), 25 Units (01/26/2020), 25 Units (02/09/2020), 25 Units (02/23/2020), 25 Units (03/08/2020), 25 Units (03/22/2020), 25 Units (04/05/2020), 25 Units (04/19/2020), 25 Units (05/03/2020), 25 Units (05/17/2020), 25 Units (05/31/2020) dacarbazine (DTIC) 920 mg in sodium chloride 0.9 % 250 mL chemo infusion, 375 mg/m2 = 920 mg, Intravenous,  Once, 6 of 6 cycles Administration: 920 mg (01/12/2020), 920 mg (01/26/2020), 920 mg (02/09/2020), 920 mg (02/23/2020), 920 mg (03/08/2020), 920 mg (03/22/2020), 920 mg (04/05/2020), 920 mg (04/19/2020), 920 mg (05/03/2020), 920 mg (05/17/2020), 920 mg (05/31/2020) fosaprepitant (EMEND) 150 mg in sodium  chloride 0.9 % 145 mL IVPB, 150 mg, Intravenous,  Once, 6 of 6 cycles Administration: 150 mg (01/12/2020), 150 mg (01/26/2020), 150 mg (02/09/2020), 150 mg (02/23/2020), 150 mg (03/08/2020), 150 mg (03/22/2020), 150 mg (04/05/2020), 150 mg (04/19/2020), 150 mg (05/03/2020), 150 mg (05/17/2020), 150 mg (05/31/2020) vinBLAStine (VELBAN) 15 mg in sodium chloride 0.9 % 50 mL chemo infusion, 6.1 mg/m2 = 14.8 mg, Intravenous, Once, 6 of 6 cycles Administration: 15 mg (01/12/2020), 15 mg (01/26/2020), 15 mg (02/09/2020), 15 mg (02/23/2020), 15 mg (03/08/2020), 15 mg (03/22/2020), 15 mg (04/05/2020), 15 mg (04/19/2020), 15 mg (05/03/2020), 15 mg (05/17/2020), 15 mg (05/31/2020)  for chemotherapy treatment.      CANCER STAGING: Cancer Staging Hodgkin's disease [facility]) Staging form: Hodgkin and Non-Hodgkin Lymphoma, AJCC 8th Edition - Clinical stage from 01/06/2020: Stage IV (Hodgkin lymphoma, A - Asymptomatic) - Unsigned   INTERVAL HISTORY:  [name], a 24 y.o. male, returns for routine follow-up and consideration for next cycle of chemotherapy. Tyler [name] was last seen on 05/31/2020.  Due for day #15 of cycle #6 of ABVD today.   Overall, he tells me he has been feeling pretty well. He complains of the nausea still being present. His hands are still "tight" but no numbness or tingling. He denies SOB or cough.  Overall, he feels ready for next cycle of chemo today.    REVIEW OF SYSTEMS:  Review of Systems  Constitutional: Negative for appetite change and fatigue.  Respiratory: Negative for cough and shortness of breath.   Gastrointestinal: Positive for nausea and vomiting.  Neurological: Negative for numbness.  All other systems reviewed and are negative.   PAST MEDICAL/SURGICAL HISTORY:  Past  Medical History:  Diagnosis Date  . ADD (attention deficit disorder)    Inattention type  . Cyclic vomiting syndrome 2009  . GERD (gastroesophageal reflux disease)   . Hypertension    Past Surgical History:  Procedure  Laterality Date  . closed reduction rt. wrist    . IR IMAGING GUIDED PORT INSERTION  12/31/2019  . MASS BIOPSY Left 01/03/2020   Procedure: OPEN NECK BIOPSY;  Surgeon: Leta Baptist, MD;  Location: [facility];  Service: ENT;  Laterality: Left;    SOCIAL HISTORY:  Social History   Socioeconomic History  . Marital status: Single    Spouse name: Not on file  . Number of children: 0  . Years of education: Not on file  . Highest education level: Not on file  Occupational History  . Not on file  Tobacco Use  . Smoking status: Never Smoker  . Smokeless tobacco: Former Network engineer  . Vaping Use: Never used  Substance and Sexual Activity  . Alcohol use: Never  . Drug use: Never  . Sexual activity: Not Currently  Other Topics Concern  . Not on file  Social History Narrative  . Not on file   Social Determinants of Health   Financial Resource Strain: Low Risk   . Difficulty of Paying Living Expenses: Not hard at all  Food Insecurity: No Food Insecurity  . Worried About Charity fundraiser in the Last Year: Never true  . Ran Out of Food in the Last Year: Never true  Transportation Needs: No Transportation Needs  . Lack of Transportation (Medical): No  . Lack of Transportation (Non-Medical): No  Physical Activity: Inactive  . Days of Exercise per Week: 0 days  . Minutes of Exercise per Session: 0 min  Stress: No Stress Concern Present  . Feeling of Stress : Not at all  Social Connections: Moderately Isolated  . Frequency of Communication with Friends and Family: More than three times a week  . Frequency of Social Gatherings with Friends and Family: More than three times a week  . Attends Religious Services: More than 4 times per year  . Active Member of Clubs or Organizations: No  . Attends Archivist Meetings: Never  . Marital Status: Never married  Intimate Partner Violence: Not At Risk  . Fear of Current or Ex-Partner: No  . Emotionally Abused: No  .  Physically Abused: No  . Sexually Abused: No    FAMILY HISTORY:  Family History  Problem Relation Age of Onset  . Cancer Paternal Grandmother   . Hypertension Mother   . Hypertension Father     CURRENT MEDICATIONS:  Current Outpatient Medications  Medication Sig Dispense Refill  . acetaminophen (TYLENOL) 325 MG tablet Take 650 mg by mouth 2 (two) times daily as needed for moderate pain or fever.     Marland Kitchen amLODipine (NORVASC) 10 MG tablet Take 1 tablet (10 mg total) by mouth daily. 90 tablet 1  . bleomycin in sodium chloride 0.9 % 50 mL Inject into the vein every 14 (fourteen) days. Day 1, 15 every 28 days    . cephALEXin (KEFLEX) 500 MG capsule Take 1 capsule (500 mg total) by mouth 2 (two) times daily. 14 capsule 0  . DACARBAZINE IV Inject into the vein every 14 (fourteen) days. Day 1, 15 every 28 days    . DOXORUBICIN HCL IV Inject into the vein every 14 (fourteen) days. Day 1, 15 every 28 days    .  omeprazole (PRILOSEC OTC) 20 MG tablet Take 1 tablet (20 mg total) by mouth daily. 90 tablet 1  . sodium chloride (OCEAN) 0.65 % SOLN nasal spray Place 1 spray into both nostrils as needed for congestion.    . vinBLAStine in sodium chloride 0.9 % 50 mL Inject into the vein. Day 1, 15 every 28 days    . lidocaine-prilocaine (EMLA) cream Apply small amount over port site and cover with plastic wrap 1 hour before appointment. (Patient not taking: Reported on 06/14/2020) 30 g 3  . ondansetron (ZOFRAN) 8 MG tablet Take 1 tablet (8 mg total) by mouth every 8 (eight) hours as needed for nausea or vomiting. (Patient not taking: Reported on 06/14/2020) 30 tablet 0  . prochlorperazine (COMPAZINE) 10 MG tablet Take 1 tablet (10 mg total) by mouth every 6 (six) hours as needed (Nausea or vomiting). (Patient not taking: Reported on 06/14/2020) 45 tablet 2   No current facility-administered medications for this visit.   Facility-Administered Medications Ordered in Other Visits  Medication Dose Route  Frequency Provider Last Rate Last Admin  . sodium chloride flush (NS) 0.9 % injection 10 mL  10 mL Intravenous PRN Derek Jack, MD   10 mL at 05/19/20 1033    ALLERGIES:  Allergies  Allergen Reactions  . Ace Inhibitors Swelling    PHYSICAL EXAM:  Performance status (ECOG): 1 - Symptomatic but completely ambulatory  Vitals:   06/14/20 0804  BP: (!) 155/96  Pulse: 86  Resp: 18  Temp: (!) 97.2 F (36.2 C)  SpO2: 100%   Wt Readings from Last 3 Encounters:  06/14/20 261 lb 14.5 oz (118.8 kg)  06/09/20 262 lb 6.4 oz (119 kg)  05/31/20 262 lb 9.6 oz (119.1 kg)   Physical Exam Vitals reviewed.  Constitutional:      Appearance: Normal appearance. He is obese.  Cardiovascular:     Rate and Rhythm: Normal rate and regular rhythm.     Pulses: Normal pulses.     Heart sounds: Normal heart sounds.  Pulmonary:     Effort: Pulmonary effort is normal.     Breath sounds: Normal breath sounds.  Chest:     Comments: Port-a-Cath on R chest Abdominal:     Palpations: Abdomen is soft. There is no hepatomegaly, splenomegaly or mass.     Tenderness: There is no abdominal tenderness.     Hernia: No hernia is present.  Musculoskeletal:     Right lower leg: No edema.     Left lower leg: No edema.  Lymphadenopathy:     Cervical: No cervical adenopathy.     Upper Body:     Right upper body: No supraclavicular adenopathy.     Left upper body: No supraclavicular adenopathy.  Neurological:     General: No focal deficit present.     Mental Status: He is alert and oriented to person, place, and time.  Psychiatric:        Mood and Affect: Mood normal.        Behavior: Behavior normal.     LABORATORY DATA:  I have reviewed the labs as listed.  CBC Latest Ref Rng & Units 06/14/2020 05/31/2020 05/17/2020  WBC 4.0 - 10.5 K/uL 9.9 12.2(H) 10.9(H)  Hemoglobin 13.0 - 17.0 g/dL 12.0(L) 11.8(L) 12.1(L)  Hematocrit 39 - 52 % 37.7(L) 37.1(L) 37.2(L)  Platelets 150 - 400 K/uL 260 237 239    CMP Latest Ref Rng & Units 06/14/2020 05/31/2020 05/17/2020  Glucose 70 - 99 mg/dL 113(H)  133(H) 126(H)  BUN 6 - 20 mg/dL 14 18 16   Creatinine 0.61 - 1.24 mg/dL 0.83 0.92 0.95  Sodium 135 - 145 mmol/L 136 137 137  Potassium 3.5 - 5.1 mmol/L 3.9 4.0 4.0  Chloride 98 - 111 mmol/L 105 104 104  CO2 22 - 32 mmol/L 24 25 23   Calcium 8.9 - 10.3 mg/dL 8.6(L) 8.9 8.5(L)  Total Protein 6.5 - 8.1 g/dL 6.7 6.6 6.6  Total Bilirubin 0.3 - 1.2 mg/dL 0.3 0.2(L) 0.6  Alkaline Phos 38 - 126 U/L 81 87 86  AST 15 - 41 U/L 16 15 14(L)  ALT 0 - 44 U/L 33 32 30   Lab Results  Component Value Date   LDH 207 (H) 06/14/2020   LDH 208 (H) 05/31/2020   LDH 203 (H) 05/17/2020    DIAGNOSTIC IMAGING:  I have independently reviewed the scans and discussed with the patient. No results found.   ASSESSMENT:  1. Stage IV AE classical Hodgkin's disease: -PET scan on 01/05/2020 shows hypermetabolic left cervical, supraclavicular, bilateral mediastinal adenopathy. Hypermetabolic porta hepatic lymph nodes. Mildly increased FDG uptake associated with spleen which is above liver activity. Diffuse increase uptake in the axial and proximal appendicular skeleton. -IPI score of 3. -2 cycles of ABVD on 01/12/2020 on 02/09/2020. -PET2on 03/06/2010 shows left lower neck lymph node decreased in size and metabolic some. Measures 2 cm, previously 3.7 cm with SUV 1.9, previously 14.5. 1.6 cm left supraclavicular lymph node with SUV 3.0, previously 2.6 cm with SUV 9.8. Left prevascular lymph node decreased to 1.9 cm from 5.3 cm. Right prevascular lymph node decreased to 0.8 cm, previously 2.3 cm. Borderline prominent 0.9 cm right external iliac lymph node with mild hypermetabolism with maximum SUV 3.5 new. -The right external iliac lymph node is likely reactive. I have recommended continuing 2 more cycles of ABVD and repeat PET scan after cycle 4. -PET4on 04/28/2020 shows previously described right external iliac lymph node  measures 0.8 cm, previously 0.9 cm with maximum SUV 1.9, previously 3.5. Significantly reduced size of the left lower neck and mediastinal lymph nodes, [place] 3 activity.   PLAN:  1. Stage IV classical Hodgkin's disease: -He has tolerated last treatment very well.  He received IV fluids 2 days after chemotherapy.  He did not feel any lightheaded. -We I have reviewed his labs.  They are adequate to proceed with his last treatment today. -We will plan to arrange for more fluids on Friday when he comes back for Neulasta. -I plan to see him back in 4 weeks with repeat PET scan.  2. Peripheral neuropathy: -He did not report any numbness at this time for the last 2 weeks.  However his hands feel tight occasionally.  3. Nausea/vomiting: -Continue Zofran 8 mg every 8 hours as needed.   Orders placed this encounter:  No orders of the defined types were placed in this encounter.    Derek Jack, MD [place] [phone]   I, Milinda Antis, am acting as a scribe for Dr. Sanda Linger.  I, Derek Jack MD, have reviewed the above documentation for accuracy and completeness, and I agree with the above.

## 2020-06-14 NOTE — Patient Instructions (Signed)
[facility] at [facility] Discharge Instructions  You were seen today by Dr. Katragadda. He went over your recent results. You received your treatment today. You will be scheduled for a PET scan before your next visit. Dr. Katragadda will see you back in 4 weeks for labs and follow up.   Thank you for choosing [facility] at [facility] to provide your oncology and hematology care.  To afford each patient quality time with our provider, please arrive at least 15 minutes before your scheduled appointment time.   If you have a lab appointment with the Cancer Center please come in thru the Main Entrance and check in at the main information desk  You need to re-schedule your appointment should you arrive 10 or more minutes late.  We strive to give you quality time with our providers, and arriving late affects you and other patients whose appointments are after yours.  Also, if you no show three or more times for appointments you may be dismissed from the clinic at the providers discretion.     Again, thank you for choosing [facility].  Our hope is that these requests will decrease the amount of time that you wait before being seen by our physicians.       _____________________________________________________________  Should you have questions after your visit to [facility], please contact our office at [phone] between the hours of 8:00 a.m. and 4:30 p.m.  Voicemails left after 4:00 p.m. will not be returned until the following business day.  For prescription refill requests, have your pharmacy contact our office and allow 72 hours.    Cancer Center Support Programs:   > Cancer Support Group  2nd Tuesday of the month 1pm-2pm, Journey Room    

## 2020-06-14 NOTE — Patient Instructions (Signed)
[facility] Discharge Instructions for Patients Receiving Chemotherapy  Today you received your chemotherapy today.   To help prevent nausea and vomiting after your treatment, we encourage you to take your nausea medications as directed.    If you develop nausea and vomiting that is not controlled by your nausea medication, call the clinic.   BELOW ARE SYMPTOMS THAT SHOULD BE REPORTED IMMEDIATELY:  *FEVER GREATER THAN 100.5 F  *CHILLS WITH OR WITHOUT FEVER  NAUSEA AND VOMITING THAT IS NOT CONTROLLED WITH YOUR NAUSEA MEDICATION  *UNUSUAL SHORTNESS OF BREATH  *UNUSUAL BRUISING OR BLEEDING  TENDERNESS IN MOUTH AND THROAT WITH OR WITHOUT PRESENCE OF ULCERS  *URINARY PROBLEMS  *BOWEL PROBLEMS  UNUSUAL RASH Items with * indicate a potential emergency and should be followed up as soon as possible.  Feel free to call the clinic should you have any questions or concerns. The clinic phone number is [phone].  Please show the [facility] at check-in to the Emergency Department and triage nurse.

## 2020-06-16 ENCOUNTER — Other Ambulatory Visit: Payer: Self-pay

## 2020-06-16 ENCOUNTER — Inpatient Hospital Stay (HOSPITAL_COMMUNITY): Payer: BC Managed Care – PPO

## 2020-06-16 VITALS — BP 119/90 | HR 79 | Temp 98.3°F | Resp 16

## 2020-06-16 DIAGNOSIS — C8190 Hodgkin lymphoma, unspecified, unspecified site: Secondary | ICD-10-CM | POA: Diagnosis not present

## 2020-06-16 DIAGNOSIS — Z5189 Encounter for other specified aftercare: Secondary | ICD-10-CM | POA: Diagnosis not present

## 2020-06-16 DIAGNOSIS — R110 Nausea: Secondary | ICD-10-CM

## 2020-06-16 DIAGNOSIS — Z5111 Encounter for antineoplastic chemotherapy: Secondary | ICD-10-CM | POA: Diagnosis not present

## 2020-06-16 DIAGNOSIS — C8118 Nodular sclerosis classical Hodgkin lymphoma, lymph nodes of multiple sites: Secondary | ICD-10-CM

## 2020-06-16 MED ORDER — PEGFILGRASTIM-JMDB 6 MG/0.6ML ~~LOC~~ SOSY
6.0000 mg | PREFILLED_SYRINGE | Freq: Once | SUBCUTANEOUS | Status: AC
  Administered 2020-06-16: 6 mg via SUBCUTANEOUS
  Filled 2020-06-16: qty 0.6

## 2020-06-16 MED ORDER — ONDANSETRON HCL 4 MG/2ML IJ SOLN
INTRAMUSCULAR | Status: AC
  Filled 2020-06-16: qty 4

## 2020-06-16 MED ORDER — SODIUM CHLORIDE 0.9 % IV SOLN: Freq: Once | INTRAVENOUS | Status: AC

## 2020-06-16 MED ORDER — ONDANSETRON HCL 4 MG/2ML IJ SOLN
8.0000 mg | Freq: Once | INTRAMUSCULAR | Status: AC
  Administered 2020-06-16: 8 mg via INTRAVENOUS

## 2020-06-16 MED ORDER — HEPARIN SOD (PORK) LOCK FLUSH 100 UNIT/ML IV SOLN
500.0000 [IU] | Freq: Once | INTRAVENOUS | Status: AC
  Administered 2020-06-16: 500 [IU] via INTRAVENOUS

## 2020-06-16 NOTE — Progress Notes (Signed)
Patient tolerated hydration and injection with no complaints voiced.  Port site clean and dry with good blood return noted before and after hydration.  No bruising or swelling noted with port.  Band aid applied.  VSS with discharge and left ambulatory with no s/s of distress noted.

## 2020-06-25 ENCOUNTER — Encounter: Payer: Self-pay | Admitting: Family Medicine

## 2020-06-25 NOTE — Progress Notes (Signed)
Patient ID: Tyler [name], male    DOB: [birth year], 24 y.o.   MRN: [id number]     Patient ID: Tyler [name], male    DOB: July 04, 1996, 24 y.o.   MRN: [id number]   Chief Complaint  Patient presents with  . bump on leg    Patient reports painful bump on the upper right thigh since last week, but this has gotten better and is almost gone, no longer an issue.  Marland Kitchen Hypertension   Subjective:    HPI Pt seen for HTN- doing well and no new concerns.  Does have h/o hodgkin's lymphoma and getting treatments and chemotherapy.  Has been doing well and not having any new illness.  Pt had lump on inner groin/thigh and perineal area.  Has mostly resolved per the pt.  HTN Pt compliant with BP meds.  No SEs Denies chest pain, sob, LE swelling, or blurry vision.   Medical History Tyler [name] has a past medical history of ADD (attention deficit disorder), Cyclic vomiting syndrome (2009), GERD (gastroesophageal reflux disease), and Hypertension.   Outpatient Encounter Medications as of 06/09/2020  Medication Sig  . acetaminophen (TYLENOL) 325 MG tablet Take 650 mg by mouth 2 (two) times daily as needed for moderate pain or fever.   Marland Kitchen amLODipine (NORVASC) 10 MG tablet Take 1 tablet (10 mg total) by mouth daily.  . bleomycin in sodium chloride 0.9 % 50 mL Inject into the vein every 14 (fourteen) days. Day 1, 15 every 28 days  . cephALEXin (KEFLEX) 500 MG capsule Take 1 capsule (500 mg total) by mouth 2 (two) times daily.  Marland Kitchen DACARBAZINE IV Inject into the vein every 14 (fourteen) days. Day 1, 15 every 28 days  . DOXORUBICIN HCL IV Inject into the vein every 14 (fourteen) days. Day 1, 15 every 28 days  . lidocaine-prilocaine (EMLA) cream Apply small amount over port site and cover with plastic wrap 1 hour before appointment. (Patient not taking: Reported on 06/14/2020)  . omeprazole (PRILOSEC OTC) 20 MG tablet Take 1 tablet (20 mg total) by mouth daily.  . ondansetron (ZOFRAN) 8 MG tablet Take 1 tablet (8  mg total) by mouth every 8 (eight) hours as needed for nausea or vomiting. (Patient not taking: Reported on 06/14/2020)  . prochlorperazine (COMPAZINE) 10 MG tablet Take 1 tablet (10 mg total) by mouth every 6 (six) hours as needed (Nausea or vomiting). (Patient not taking: Reported on 06/14/2020)  . sodium chloride (OCEAN) 0.65 % SOLN nasal spray Place 1 spray into both nostrils as needed for congestion.  . vinBLAStine in sodium chloride 0.9 % 50 mL Inject into the vein. Day 1, 15 every 28 days  . [DISCONTINUED] amLODipine (NORVASC) 10 MG tablet Take 1 tablet by mouth once daily  . [DISCONTINUED] loratadine (CLARITIN) 10 MG tablet Take 10 mg by mouth daily.   Facility-Administered Encounter Medications as of 06/09/2020  Medication  . sodium chloride flush (NS) 0.9 % injection 10 mL     Review of Systems  Constitutional: Negative for chills and fever.  HENT: Negative for congestion, rhinorrhea and sore throat.   Respiratory: Negative for cough, shortness of breath and wheezing.   Cardiovascular: Negative for chest pain and leg swelling.  Gastrointestinal: Negative for abdominal pain, diarrhea, nausea and vomiting.  Genitourinary: Negative for dysuria and frequency.  Skin: Negative for rash.       +nodule in inguinal area near perineum.  Neurological: Negative for dizziness, weakness and headaches.     Vitals  BP 118/76   Pulse (!) 104   Temp 97.8 F (36.6 C)   Wt 262 lb 6.4 oz (119 kg)   SpO2 100%   BMI 32.80 kg/m   Objective:   Physical Exam Vitals and nursing note reviewed.  Constitutional:      General: He is not in acute distress.    Appearance: Normal appearance. He is not ill-appearing.  HENT:     Head: Normocephalic.     Nose: Nose normal. No congestion.     Mouth/Throat:     Mouth: Mucous membranes are moist.     Pharynx: No oropharyngeal exudate.  Eyes:     Extraocular Movements: Extraocular movements intact.     Conjunctiva/sclera: Conjunctivae normal.      Pupils: Pupils are equal, round, and reactive to light.  Cardiovascular:     Rate and Rhythm: Regular rhythm. Tachycardia present.     Pulses: Normal pulses.     Heart sounds: Normal heart sounds. No murmur heard.   Pulmonary:     Effort: Pulmonary effort is normal.     Breath sounds: Normal breath sounds. No wheezing, rhonchi or rales.  Musculoskeletal:        General: Normal range of motion.     Right lower leg: No edema.     Left lower leg: No edema.  Skin:    General: Skin is warm and dry.     Findings: No rash.  Neurological:     General: No focal deficit present.     Mental Status: He is alert and oriented to person, place, and time.     Cranial Nerves: No cranial nerve deficit.  Psychiatric:        Mood and Affect: Mood normal.        Behavior: Behavior normal.        Thought Content: Thought content normal.        Judgment: Judgment normal.      Assessment and Plan   1. Abscess of right thigh  2. Essential hypertension  3. 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  However, wanted to cover with abx, since pt is on chemotherapy. Pt given course of keflex to see if can resolve the remaining area of possible abscess in right inner thigh/perineal area. Warm compress or sitz bath.   htn- stable, cont meds.  Nodulear sclerosis Hodgkin lymphoma- stable.       cont f/u with oncology and treatments as scheduled.  Pt to call or rto if not improving.  F/u in 25mo or prn.

## 2020-07-10 ENCOUNTER — Other Ambulatory Visit: Payer: Self-pay

## 2020-07-10 ENCOUNTER — Ambulatory Visit (HOSPITAL_COMMUNITY)
Admission: RE | Admit: 2020-07-10 | Discharge: 2020-07-10 | Disposition: A | Discharge status: Home / Self Care | Payer: BC Managed Care – PPO | Source: Ambulatory Visit | Attending: Hematology | Admitting: Hematology

## 2020-07-10 DIAGNOSIS — C8118 Nodular sclerosis classical Hodgkin lymphoma, lymph nodes of multiple sites: Secondary | ICD-10-CM | POA: Diagnosis not present

## 2020-07-10 DIAGNOSIS — C8590 Non-Hodgkin lymphoma, unspecified, unspecified site: Secondary | ICD-10-CM | POA: Diagnosis not present

## 2020-07-10 MED ORDER — FLUDEOXYGLUCOSE F - 18 (FDG) INJECTION
14.5000 | Freq: Once | INTRAVENOUS | Status: AC | PRN
  Administered 2020-07-10: 14.5 via INTRAVENOUS

## 2020-07-12 ENCOUNTER — Other Ambulatory Visit (HOSPITAL_COMMUNITY): Payer: Self-pay

## 2020-07-12 DIAGNOSIS — C8118 Nodular sclerosis classical Hodgkin lymphoma, lymph nodes of multiple sites: Secondary | ICD-10-CM

## 2020-07-12 DIAGNOSIS — R591 Generalized enlarged lymph nodes: Secondary | ICD-10-CM

## 2020-07-13 ENCOUNTER — Other Ambulatory Visit: Payer: Self-pay

## 2020-07-13 ENCOUNTER — Inpatient Hospital Stay (HOSPITAL_COMMUNITY): Payer: BC Managed Care – PPO | Attending: Hematology | Admitting: Hematology

## 2020-07-13 ENCOUNTER — Inpatient Hospital Stay (HOSPITAL_COMMUNITY): Payer: BC Managed Care – PPO

## 2020-07-13 VITALS — BP 130/79 | HR 95 | Temp 97.3°F | Resp 18 | Wt 265.2 lb

## 2020-07-13 DIAGNOSIS — Z79899 Other long term (current) drug therapy: Secondary | ICD-10-CM | POA: Insufficient documentation

## 2020-07-13 DIAGNOSIS — I10 Essential (primary) hypertension: Secondary | ICD-10-CM | POA: Diagnosis not present

## 2020-07-13 DIAGNOSIS — C8118 Nodular sclerosis classical Hodgkin lymphoma, lymph nodes of multiple sites: Secondary | ICD-10-CM | POA: Diagnosis not present

## 2020-07-13 DIAGNOSIS — G629 Polyneuropathy, unspecified: Secondary | ICD-10-CM | POA: Diagnosis not present

## 2020-07-13 DIAGNOSIS — C8190 Hodgkin lymphoma, unspecified, unspecified site: Secondary | ICD-10-CM | POA: Diagnosis not present

## 2020-07-13 DIAGNOSIS — R591 Generalized enlarged lymph nodes: Secondary | ICD-10-CM

## 2020-07-13 LAB — CBC WITH DIFFERENTIAL/PLATELET
Abs Immature Granulocytes: 0.01 10*3/uL (ref 0.00–0.07)
Basophils Absolute: 0.1 10*3/uL (ref 0.0–0.1)
Basophils Relative: 1 %
Eosinophils Absolute: 0.2 10*3/uL (ref 0.0–0.5)
Eosinophils Relative: 3 %
HCT: 38 % — ABNORMAL LOW (ref 39.0–52.0)
Hemoglobin: 12 g/dL — ABNORMAL LOW (ref 13.0–17.0)
Immature Granulocytes: 0 %
Lymphocytes Relative: 16 %
Lymphs Abs: 1.1 10*3/uL (ref 0.7–4.0)
MCH: 29 pg (ref 26.0–34.0)
MCHC: 31.6 g/dL (ref 30.0–36.0)
MCV: 91.8 fL (ref 80.0–100.0)
Monocytes Absolute: 0.5 10*3/uL (ref 0.1–1.0)
Monocytes Relative: 7 %
Neutro Abs: 5 10*3/uL (ref 1.7–7.7)
Neutrophils Relative %: 73 %
Platelets: 300 10*3/uL (ref 150–400)
RBC: 4.14 MIL/uL — ABNORMAL LOW (ref 4.22–5.81)
RDW: 15.9 % — ABNORMAL HIGH (ref 11.5–15.5)
WBC: 6.9 10*3/uL (ref 4.0–10.5)
nRBC: 0 % (ref 0.0–0.2)

## 2020-07-13 LAB — COMPREHENSIVE METABOLIC PANEL
ALT: 31 U/L (ref 0–44)
AST: 14 U/L — ABNORMAL LOW (ref 15–41)
Albumin: 4 g/dL (ref 3.5–5.0)
Alkaline Phosphatase: 62 U/L (ref 38–126)
Anion gap: 8 (ref 5–15)
BUN: 16 mg/dL (ref 6–20)
CO2: 25 mmol/L (ref 22–32)
Calcium: 8.8 mg/dL — ABNORMAL LOW (ref 8.9–10.3)
Chloride: 104 mmol/L (ref 98–111)
Creatinine, Ser: 0.87 mg/dL (ref 0.61–1.24)
GFR calc Af Amer: >60 mL/min (ref >60)
GFR calc non Af Amer: >60 mL/min (ref >60)
Glucose, Bld: 115 mg/dL — ABNORMAL HIGH (ref 70–99)
Potassium: 3.7 mmol/L (ref 3.5–5.1)
Sodium: 137 mmol/L (ref 135–145)
Total Bilirubin: 0.4 mg/dL (ref 0.3–1.2)
Total Protein: 6.9 g/dL (ref 6.5–8.1)

## 2020-07-13 LAB — MAGNESIUM: Magnesium: 1.9 mg/dL (ref 1.7–2.4)

## 2020-07-13 LAB — URIC ACID: Uric Acid, Serum: 6.2 mg/dL (ref 3.7–8.6)

## 2020-07-13 LAB — PHOSPHORUS: Phosphorus: 3.4 mg/dL (ref 2.5–4.6)

## 2020-07-13 LAB — LACTATE DEHYDROGENASE: LDH: 164 U/L (ref 98–192)

## 2020-07-13 NOTE — Patient Instructions (Signed)
[facility] at [facility] Discharge Instructions  You were seen today by Dr. Katragadda. He went over your recent results and scans. Dr. Katragadda will see you back in 3 months for labs and follow up.   Thank you for choosing [facility] at [facility] to provide your oncology and hematology care.  To afford each patient quality time with our provider, please arrive at least 15 minutes before your scheduled appointment time.   If you have a lab appointment with the Cancer Center please come in thru the Main Entrance and check in at the main information desk  You need to re-schedule your appointment should you arrive 10 or more minutes late.  We strive to give you quality time with our providers, and arriving late affects you and other patients whose appointments are after yours.  Also, if you no show three or more times for appointments you may be dismissed from the clinic at the providers discretion.     Again, thank you for choosing [facility].  Our hope is that these requests will decrease the amount of time that you wait before being seen by our physicians.       _____________________________________________________________  Should you have questions after your visit to  Cancer Center, please contact our office at [phone] between the hours of 8:00 a.m. and 4:30 p.m.  Voicemails left after 4:00 p.m. will not be returned until the following business day.  For prescription refill requests, have your pharmacy contact our office and allow 72 hours.    Cancer Center Support Programs:   > Cancer Support Group  2nd Tuesday of the month 1pm-2pm, Journey Room    

## 2020-07-13 NOTE — Progress Notes (Signed)
[place], [place] [address]   CLINIC:  Medical Oncology/Hematology  PCP:  Erven Colla, DO [address] / [place] [address]  [phone]  REASON FOR VISIT:  Follow-up for Hodgkin's disease  PRIOR THERAPY: ABVD x 6 cycles from 01/12/2020 to 06/14/2020  CURRENT THERAPY: Observation  INTERVAL HISTORY:  Tyler [name], a 24 y.o. male, returns for routine follow-up for his Hodgkin's disease. Tyler [name] was last seen on 06/14/2020.  Today he complains of having some pain in his left collar bone, in the same place where he fractured it before. He denies having any SOB or N/V/D and his numbness and sensation of fullness have resolved. His appetite is good.  He is back to doing his work 100%. He is inquiring about getting his COVID vaccine.   REVIEW OF SYSTEMS:  Review of Systems  Constitutional: Negative for appetite change and fatigue.  Respiratory: Negative for shortness of breath.   Cardiovascular: Negative for leg swelling.  Gastrointestinal: Negative for diarrhea, nausea and vomiting.  Musculoskeletal: Positive for arthralgias (3/10 L collarbone pain).  Neurological: Negative for numbness.  All other systems reviewed and are negative.   PAST MEDICAL/SURGICAL HISTORY:  Past Medical History:  Diagnosis Date   ADD (attention deficit disorder)    Inattention type   Cyclic vomiting syndrome 2009   GERD (gastroesophageal reflux disease)    Hypertension    Past Surgical History:  Procedure Laterality Date   closed reduction rt. wrist     IR IMAGING GUIDED PORT INSERTION  12/31/2019   MASS BIOPSY Left 01/03/2020   Procedure: OPEN NECK BIOPSY;  Surgeon: Leta Baptist, MD;  Location: [facility];  Service: ENT;  Laterality: Left;    SOCIAL HISTORY:  Social History   Socioeconomic History   Marital status: Single    Spouse name: Not on file   Number of children: 0   Years of education: Not on file    Highest education level: Not on file  Occupational History   Not on file  Tobacco Use   Smoking status: Never Smoker   Smokeless tobacco: Former Counsellor Use: Never used  Substance and Sexual Activity   Alcohol use: Never   Drug use: Never   Sexual activity: Not Currently  Other Topics Concern   Not on file  Social History Narrative   Not on file   Social Determinants of Health   Financial Resource Strain: Low Risk    Difficulty of Paying Living Expenses: Not hard at all  Food Insecurity: No Food Insecurity   Worried About Charity fundraiser in the Last Year: Never true   [place] in the Last Year: Never true  Transportation Needs: No Transportation Needs   Lack of Transportation (Medical): No   Lack of Transportation (Non-Medical): No  Physical Activity: Inactive   Days of Exercise per Week: 0 days   Minutes of Exercise per Session: 0 min  Stress: No Stress Concern Present   Feeling of Stress : Not at all  Social Connections: Moderately Isolated   Frequency of Communication with Friends and Family: More than three times a week   Frequency of Social Gatherings with Friends and Family: More than three times a week   Attends Religious Services: More than 4 times per year   Active Member of [employer] or Organizations: No   Attends Archivist Meetings: Never   Marital Status:  Never married  Human resources officer Violence: Not At Risk   Fear of Current or Ex-Partner: No   Emotionally Abused: No   Physically Abused: No   Sexually Abused: No    FAMILY HISTORY:  Family History  Problem Relation Age of Onset   Cancer Paternal Grandmother    Hypertension Mother    Hypertension Father     CURRENT MEDICATIONS:  Current Outpatient Medications  Medication Sig Dispense Refill   acetaminophen (TYLENOL) 325 MG tablet Take 650 mg by mouth 2 (two) times daily as needed for moderate pain or fever.      amLODipine  (NORVASC) 10 MG tablet Take 1 tablet (10 mg total) by mouth daily. 90 tablet 1   bleomycin in sodium chloride 0.9 % 50 mL Inject into the vein every 14 (fourteen) days. Day 1, 15 every 28 days     cephALEXin (KEFLEX) 500 MG capsule Take 1 capsule (500 mg total) by mouth 2 (two) times daily. 14 capsule 0   DACARBAZINE IV Inject into the vein every 14 (fourteen) days. Day 1, 15 every 28 days     DOXORUBICIN HCL IV Inject into the vein every 14 (fourteen) days. Day 1, 15 every 28 days     lidocaine-prilocaine (EMLA) cream Apply small amount over port site and cover with plastic wrap 1 hour before appointment. 30 g 3   omeprazole (PRILOSEC OTC) 20 MG tablet Take 1 tablet (20 mg total) by mouth daily. 90 tablet 1   ondansetron (ZOFRAN) 8 MG tablet Take 1 tablet (8 mg total) by mouth every 8 (eight) hours as needed for nausea or vomiting. 30 tablet 0   prochlorperazine (COMPAZINE) 10 MG tablet Take 1 tablet (10 mg total) by mouth every 6 (six) hours as needed (Nausea or vomiting). 45 tablet 2   sodium chloride (OCEAN) 0.65 % SOLN nasal spray Place 1 spray into both nostrils as needed for congestion.     vinBLAStine in sodium chloride 0.9 % 50 mL Inject into the vein. Day 1, 15 every 28 days     No current facility-administered medications for this visit.   Facility-Administered Medications Ordered in Other Visits  Medication Dose Route Frequency Provider Last Rate Last Admin   sodium chloride flush (NS) 0.9 % injection 10 mL  10 mL Intravenous PRN Derek Jack, MD   10 mL at 05/19/20 1033    ALLERGIES:  Allergies  Allergen Reactions   Ace Inhibitors Swelling    PHYSICAL EXAM:  Performance status (ECOG): 1 - Symptomatic but completely ambulatory  Vitals:   07/13/20 1541  BP: 130/79  Pulse: 95  Resp: 18  Temp: (!) 97.3 F (36.3 C)  SpO2: 100%   Wt Readings from Last 3 Encounters:  07/13/20 265 lb 3.4 oz (120.3 kg)  06/14/20 261 lb 14.5 oz (118.8 kg)  06/09/20 262  lb 6.4 oz (119 kg)   Physical Exam Vitals reviewed.  Constitutional:      Appearance: Normal appearance. He is obese.  Cardiovascular:     Rate and Rhythm: Normal rate and regular rhythm.     Pulses: Normal pulses.     Heart sounds: Normal heart sounds.  Pulmonary:     Effort: Pulmonary effort is normal.     Breath sounds: Normal breath sounds.  Musculoskeletal:     Right lower leg: No edema.     Left lower leg: No edema.  Neurological:     General: No focal deficit present.     Mental Status: He  is alert and oriented to person, place, and time.  Psychiatric:        Mood and Affect: Mood normal.        Behavior: Behavior normal.     LABORATORY DATA:  I have reviewed the labs as listed.  CBC Latest Ref Rng & Units 07/13/2020 06/14/2020 05/31/2020  WBC 4.0 - 10.5 K/uL 6.9 9.9 12.2(H)  Hemoglobin 13.0 - 17.0 g/dL 12.0(L) 12.0(L) 11.8(L)  Hematocrit 39 - 52 % 38.0(L) 37.7(L) 37.1(L)  Platelets 150 - 400 K/uL 300 260 237   CMP Latest Ref Rng & Units 07/13/2020 06/14/2020 05/31/2020  Glucose 70 - 99 mg/dL 115(H) 113(H) 133(H)  BUN 6 - 20 mg/dL 16 14 18   Creatinine 0.61 - 1.24 mg/dL 0.87 0.83 0.92  Sodium 135 - 145 mmol/L 137 136 137  Potassium 3.5 - 5.1 mmol/L 3.7 3.9 4.0  Chloride 98 - 111 mmol/L 104 105 104  CO2 22 - 32 mmol/L 25 24 25   Calcium 8.9 - 10.3 mg/dL 8.8(L) 8.6(L) 8.9  Total Protein 6.5 - 8.1 g/dL 6.9 6.7 6.6  Total Bilirubin 0.3 - 1.2 mg/dL 0.4 0.3 0.2(L)  Alkaline Phos 38 - 126 U/L 62 81 87  AST 15 - 41 U/L 14(L) 16 15  ALT 0 - 44 U/L 31 33 32      Component Value Date/Time   RBC 4.14 (L) 07/13/2020 1424   MCV 91.8 07/13/2020 1424   MCV 84 12/24/2019 1556   MCH 29.0 07/13/2020 1424   MCHC 31.6 07/13/2020 1424   RDW 15.9 (H) 07/13/2020 1424   RDW 12.9 12/24/2019 1556   LYMPHSABS 1.1 07/13/2020 1424   LYMPHSABS 2.0 12/24/2019 1556   MONOABS 0.5 07/13/2020 1424   EOSABS 0.2 07/13/2020 1424   EOSABS 0.1 12/24/2019 1556   BASOSABS 0.1 07/13/2020 1424    BASOSABS 0.1 12/24/2019 1556    DIAGNOSTIC IMAGING:  I have independently reviewed the scans and discussed with the patient. NM PET Image Restag (PS) Skull Base To Thigh  Result Date: 07/11/2020 CLINICAL DATA:  Subsequent treatment strategy for lymphoma. EXAM: NUCLEAR MEDICINE PET SKULL BASE TO THIGH TECHNIQUE: 14.5 mCi F-18 FDG was injected intravenously. Full-ring PET imaging was performed from the skull base to thigh after the radiotracer. CT data was obtained and used for attenuation correction and anatomic localization. Fasting blood glucose: 99 mg/dl COMPARISON:  PET-CT dated 04/28/2020 FINDINGS: Mediastinal blood pool activity: SUV max 1.4 Liver activity: SUV max 2.3 NECK: No hypermetabolic cervical lymphadenopathy. Incidental CT findings: none CHEST: Small left supraclavicular and left superior mediastinal nodes, including: --13 mm short axis left supraclavicular node (series 3/image 35), max SUV 1.9, previously 13 mm with max SUV 2.2 --10 mm short axis left superior mediastinal node (series 3/image 92), max SUV 1.9, previously 15 mm with max SUV 2.0 No suspicious pulmonary nodules. Right chest port terminates the cavoatrial junction. Incidental CT findings: none ABDOMEN/PELVIS: No abnormal hypermetabolism in the liver, spleen, pancreas, or adrenal glands. Spleen is normal in size without focal lesion. Representative splenic activity demonstrates max SUV 1.9. No hypermetabolic abdominopelvic lymphadenopathy. Incidental CT findings: none SKELETON: No focal hypermetabolic activity to suggest skeletal metastasis. Incidental CT findings: none IMPRESSION: Small residual left supraclavicular and superior mediastinal nodes, as above. Deauville category 3. Electronically Signed   By: Julian Hy M.D.   On: 07/11/2020 11:20     ASSESSMENT:  1. Stage IV AE classical Hodgkin's disease: -PET scan on 01/05/2020 shows hypermetabolic left cervical, supraclavicular, bilateral mediastinal adenopathy.  Hypermetabolic  porta hepatic lymph nodes. Mildly increased FDG uptake associated with spleen which is above liver activity. Diffuse increase uptake in the axial and proximal appendicular skeleton. -IPI score of 3. -6 cycles of ABVD from 01/12/2020 through 06/14/2020. -PET2on 03/06/2010 shows left lower neck lymph node decreased in size and metabolic some. Measures 2 cm, previously 3.7 cm with SUV 1.9, previously 14.5. 1.6 cm left supraclavicular lymph node with SUV 3.0, previously 2.6 cm with SUV 9.8. Left prevascular lymph node decreased to 1.9 cm from 5.3 cm. Right prevascular lymph node decreased to 0.8 cm, previously 2.3 cm. Borderline prominent 0.9 cm right external iliac lymph node with mild hypermetabolism with maximum SUV 3.5 new. -The right external iliac lymph node is likely reactive. I have recommended continuing 2 more cycles of ABVD and repeat PET scan after cycle 4. -PET4on 04/28/2020 shows previously described right external iliac lymph node measures 0.8 cm, previously 0.9 cm with maximum SUV 1.9, previously 3.5. Significantly reduced size of the left lower neck and mediastinal lymph nodes, Deauville 3 activity. -PET scan on 07/10/2020 showed a 13 mm short axis left supraclavicular node SUV 1.9.  Previous SUV 2.2.  10 mm short axis left superior mediastinal node SUV 1.9, previously 2.0.  No other adenopathy was seen.  Deauville 3 activity.  PLAN:  1. Stage IV classical Hodgkin's disease: -He has completed prescribed chemotherapy and is recovering very well. -We discussed the PET scan findings with the patient and his girlfriend.  It has shown complete response. -As he did not have bulky disease (none more than 10 cm), I did not recommend any radiation therapy. -I have recommended follow-up in 3 months with clinical exam and labs.  I plan to repeat CT neck, chest, abdomen and pelvis or a PET scan in 6 months. -He was recommended to take COVID-19 vaccines.  2. Peripheral  neuropathy: -Some tightness in the hands on dangling is also improving.  3. Nausea/vomiting: -No more nausea reported.  Orders placed this encounter:  No orders of the defined types were placed in this encounter.    Derek Jack, MD [facility] [phone]   I, Milinda Antis, am acting as a scribe for Dr. Sanda Linger.  I, Derek Jack MD, have reviewed the above documentation for accuracy and completeness, and I agree with the above.

## 2020-07-18 ENCOUNTER — Encounter (HOSPITAL_COMMUNITY): Payer: Self-pay

## 2020-07-26 DIAGNOSIS — L600 Ingrowing nail: Secondary | ICD-10-CM

## 2020-08-15 DIAGNOSIS — M79675 Pain in left toe(s): Secondary | ICD-10-CM | POA: Diagnosis not present

## 2020-08-15 DIAGNOSIS — M79674 Pain in right toe(s): Secondary | ICD-10-CM | POA: Diagnosis not present

## 2020-08-15 DIAGNOSIS — L600 Ingrowing nail: Secondary | ICD-10-CM | POA: Diagnosis not present

## 2020-08-29 DIAGNOSIS — L600 Ingrowing nail: Secondary | ICD-10-CM | POA: Diagnosis not present

## 2020-08-29 DIAGNOSIS — M79674 Pain in right toe(s): Secondary | ICD-10-CM | POA: Diagnosis not present

## 2020-09-11 ENCOUNTER — Inpatient Hospital Stay (HOSPITAL_COMMUNITY): Payer: BC Managed Care – PPO | Attending: Hematology

## 2020-09-11 ENCOUNTER — Other Ambulatory Visit: Payer: Self-pay

## 2020-09-11 DIAGNOSIS — C8190 Hodgkin lymphoma, unspecified, unspecified site: Secondary | ICD-10-CM | POA: Diagnosis not present

## 2020-09-11 DIAGNOSIS — Z452 Encounter for adjustment and management of vascular access device: Secondary | ICD-10-CM | POA: Insufficient documentation

## 2020-09-11 MED ORDER — SODIUM CHLORIDE 0.9% FLUSH
10.0000 mL | Freq: Once | INTRAVENOUS | Status: AC
  Administered 2020-09-11: 10 mL

## 2020-09-11 MED ORDER — HEPARIN SOD (PORK) LOCK FLUSH 100 UNIT/ML IV SOLN
500.0000 [IU] | Freq: Once | INTRAVENOUS | Status: AC
  Administered 2020-09-11: 500 [IU] via INTRAVENOUS

## 2020-09-11 NOTE — Progress Notes (Signed)
Tyler [name] presented for Portacath access and flush. Portacath flushed with 54ml NS and 500U/37ml Heparin and needle removed intact. Procedure without incident. Patient tolerated procedure well. No complaints at this time.  Discharge from clinic ambulatory in stable condition.  Alert and oriented X 3.  Follow up with [facility] as scheduled.

## 2020-09-14 ENCOUNTER — Encounter (HOSPITAL_COMMUNITY): Payer: Self-pay | Admitting: *Deleted

## 2020-10-12 ENCOUNTER — Ambulatory Visit: Payer: BC Managed Care – PPO | Admitting: Family Medicine

## 2020-10-12 ENCOUNTER — Ambulatory Visit (HOSPITAL_COMMUNITY): Payer: BC Managed Care – PPO | Admitting: Hematology and Oncology

## 2020-10-12 ENCOUNTER — Other Ambulatory Visit (HOSPITAL_COMMUNITY): Payer: BC Managed Care – PPO

## 2020-10-12 NOTE — Progress Notes (Signed)
[place], [place] [address]   CLINIC:  Medical Oncology/Hematology  PCP:  Erven Colla, DO [address] / [place] [address]  [phone]  REASON FOR VISIT:  Follow-up for Hodgkin's disease  PRIOR THERAPY: ABVD x 6 cycles from 01/12/2020 to 06/14/2020  CURRENT THERAPY: Observation  INTERVAL HISTORY:   [name], a 24 y.o. male, returns for routine follow-up for his Hodgkin's disease.  He is doing really well, no complaints at all. No B symptoms No change in breathing, bowel or urinary habits No new neurological complaints No neuropathy.  REVIEW OF SYSTEMS:   Review of Systems  Constitutional: Negative for appetite change and fatigue.  Respiratory: Negative for shortness of breath.   Cardiovascular: Negative for leg swelling.  Gastrointestinal: Negative for diarrhea, nausea and vomiting.  Musculoskeletal: Negative for arthralgias.  Neurological: Negative for numbness.  All other systems reviewed and are negative.   PAST MEDICAL/SURGICAL HISTORY:  Past Medical History:  Diagnosis Date  . ADD (attention deficit disorder)    Inattention type  . Cyclic vomiting syndrome 2009  . GERD (gastroesophageal reflux disease)   . Hypertension    Past Surgical History:  Procedure Laterality Date  . closed reduction rt. wrist    . IR IMAGING GUIDED PORT INSERTION  12/31/2019  . MASS BIOPSY Left 01/03/2020   Procedure: OPEN NECK BIOPSY;  Surgeon: Leta Baptist, MD;  Location: [facility];  Service: ENT;  Laterality: Left;    SOCIAL HISTORY:  Social History   Socioeconomic History  . Marital status: Single    Spouse name: Not on file  . Number of children: 0  . Years of education: Not on file  . Highest education level: Not on file  Occupational History  . Not on file  Tobacco Use  . Smoking status: Never Smoker  . Smokeless tobacco: Former Network engineer  . Vaping Use: Never used  Substance and Sexual  Activity  . Alcohol use: Never  . Drug use: Never  . Sexual activity: Not Currently  Other Topics Concern  . Not on file  Social History Narrative  . Not on file   Social Determinants of Health   Financial Resource Strain: Low Risk   . Difficulty of Paying Living Expenses: Not hard at all  Food Insecurity: No Food Insecurity  . Worried About Charity fundraiser in the Last Year: Never true  . Ran Out of Food in the Last Year: Never true  Transportation Needs: No Transportation Needs  . Lack of Transportation (Medical): No  . Lack of Transportation (Non-Medical): No  Physical Activity: Inactive  . Days of Exercise per Week: 0 days  . Minutes of Exercise per Session: 0 min  Stress: No Stress Concern Present  . Feeling of Stress : Not at all  Social Connections: Moderately Isolated  . Frequency of Communication with Friends and Family: More than three times a week  . Frequency of Social Gatherings with Friends and Family: More than three times a week  . Attends Religious Services: More than 4 times per year  . Active Member of Clubs or Organizations: No  . Attends Archivist Meetings: Never  . Marital Status: Never married  Intimate Partner Violence: Not At Risk  . Fear of Current or Ex-Partner: No  . Emotionally Abused: No  . Physically Abused: No  . Sexually Abused: No    FAMILY HISTORY:  Family History  Problem Relation Age of Onset  . Cancer Paternal Grandmother   . Hypertension Mother   . Hypertension Father     CURRENT MEDICATIONS:  Current Outpatient Medications  Medication Sig Dispense Refill  . acetaminophen (TYLENOL) 325 MG tablet Take 650 mg by mouth 2 (two) times daily as needed for moderate pain or fever.     Marland Kitchen amLODipine (NORVASC) 10 MG tablet Take 1 tablet (10 mg total) by mouth daily. 90 tablet 1  . bleomycin in sodium chloride 0.9 % 50 mL Inject into the vein every 14 (fourteen) days. Day 1, 15 every 28 days    . cephALEXin (KEFLEX) 500 MG  capsule Take 1 capsule (500 mg total) by mouth 2 (two) times daily. 14 capsule 0  . DACARBAZINE IV Inject into the vein every 14 (fourteen) days. Day 1, 15 every 28 days    . DOXORUBICIN HCL IV Inject into the vein every 14 (fourteen) days. Day 1, 15 every 28 days    . lidocaine-prilocaine (EMLA) cream Apply small amount over port site and cover with plastic wrap 1 hour before appointment. 30 g 3  . omeprazole (PRILOSEC OTC) 20 MG tablet Take 1 tablet (20 mg total) by mouth daily. 90 tablet 1  . ondansetron (ZOFRAN) 8 MG tablet Take 1 tablet (8 mg total) by mouth every 8 (eight) hours as needed for nausea or vomiting. 30 tablet 0  . prochlorperazine (COMPAZINE) 10 MG tablet Take 1 tablet (10 mg total) by mouth every 6 (six) hours as needed (Nausea or vomiting). 45 tablet 2  . sodium chloride (OCEAN) 0.65 % SOLN nasal spray Place 1 spray into both nostrils as needed for congestion.    . vinBLAStine in sodium chloride 0.9 % 50 mL Inject into the vein. Day 1, 15 every 28 days     No current facility-administered medications for this visit.   Facility-Administered Medications Ordered in Other Visits  Medication Dose Route Frequency Provider Last Rate Last Admin  . sodium chloride flush (NS) 0.9 % injection 10 mL  10 mL Intravenous PRN Derek Jack, MD   10 mL at 05/19/20 1033    ALLERGIES:  Allergies  Allergen Reactions  . Ace Inhibitors Swelling    PHYSICAL EXAM:  Performance status (ECOG): 1 - Symptomatic but completely ambulatory  There were no vitals filed for this visit. Wt Readings from Last 3 Encounters:  07/13/20 265 lb 3.4 oz (120.3 kg)  06/14/20 261 lb 14.5 oz (118.8 kg)  06/09/20 262 lb 6.4 oz (119 kg)   Physical Exam Vitals reviewed.  Constitutional:      Appearance: Normal appearance. He is obese.  Cardiovascular:     Rate and Rhythm: Normal rate and regular rhythm.     Pulses: Normal pulses.     Heart sounds: Normal heart sounds.  Pulmonary:     Effort:  Pulmonary effort is normal.     Breath sounds: Normal breath sounds.  Abdominal:     General: There is no distension.     Palpations: There is no mass.  Musculoskeletal:     Right lower leg: No edema.     Left lower leg: No edema.  Neurological:     General: No focal deficit present.     Mental Status: He is alert and oriented to person, place, and time.  Psychiatric:        Mood and Affect: Mood normal.        Behavior: Behavior normal.     LABORATORY DATA:  I have reviewed the labs as listed.  CBC Latest Ref Rng & Units 07/13/2020 06/14/2020 05/31/2020  WBC 4.0 - 10.5 K/uL 6.9 9.9 12.2(H)  Hemoglobin 13.0 - 17.0 g/dL 12.0(L) 12.0(L) 11.8(L)  Hematocrit 39.0 - 52.0 % 38.0(L) 37.7(L) 37.1(L)  Platelets 150 - 400 K/uL 300 260 237   CMP Latest Ref Rng & Units 07/13/2020 06/14/2020 05/31/2020  Glucose 70 - 99 mg/dL 115(H) 113(H) 133(H)  BUN 6 - 20 mg/dL 16 14 18   Creatinine 0.61 - 1.24 mg/dL 0.87 0.83 0.92  Sodium 135 - 145 mmol/L 137 136 137  Potassium 3.5 - 5.1 mmol/L 3.7 3.9 4.0  Chloride 98 - 111 mmol/L 104 105 104  CO2 22 - 32 mmol/L 25 24 25   Calcium 8.9 - 10.3 mg/dL 8.8(L) 8.6(L) 8.9  Total Protein 6.5 - 8.1 g/dL 6.9 6.7 6.6  Total Bilirubin 0.3 - 1.2 mg/dL 0.4 0.3 0.2(L)  Alkaline Phos 38 - 126 U/L 62 81 87  AST 15 - 41 U/L 14(L) 16 15  ALT 0 - 44 U/L 31 33 32      Component Value Date/Time   RBC 4.14 (L) 07/13/2020 1424   MCV 91.8 07/13/2020 1424   MCV 84 12/24/2019 1556   MCH 29.0 07/13/2020 1424   MCHC 31.6 07/13/2020 1424   RDW 15.9 (H) 07/13/2020 1424   RDW 12.9 12/24/2019 1556   LYMPHSABS 1.1 07/13/2020 1424   LYMPHSABS 2.0 12/24/2019 1556   MONOABS 0.5 07/13/2020 1424   EOSABS 0.2 07/13/2020 1424   EOSABS 0.1 12/24/2019 1556   BASOSABS 0.1 07/13/2020 1424   BASOSABS 0.1 12/24/2019 1556    DIAGNOSTIC IMAGING:  I have independently reviewed the scans and discussed with the patient.  No results found.   ASSESSMENT:   1. Stage IV AE classical  Hodgkin's disease:  -PET scan on 01/05/2020 shows hypermetabolic left cervical, supraclavicular, bilateral mediastinal adenopathy.  --Hypermetabolic porta hepatic lymph nodes.  --Mildly increased FDG uptake associated with spleen which is above liver activity. Diffuse increase uptake in the axial and proximal appendicular skeleton. -IPI score of 3. -6 cycles of ABVD from 01/12/2020 through 06/14/2020. -PET2on 03/06/2010 shows left lower neck lymph node decreased in size and metabolic some. Measures 2 cm, previously 3.7 cm with SUV 1.9, previously 14.5. 1.6 cm left supraclavicular lymph node with SUV 3.0, previously 2.6 cm with SUV 9.8. Left prevascular lymph node decreased to 1.9 cm from 5.3 cm. Right prevascular lymph node decreased to 0.8 cm, previously 2.3 cm. Borderline prominent 0.9 cm right external iliac lymph node with mild hypermetabolism with maximum SUV 3.5 new. -The right external iliac lymph node is likely reactive. I have recommended continuing 2 more cycles of ABVD and repeat PET scan after cycle 4. -PET4on 04/28/2020 shows previously described right external iliac lymph node measures 0.8 cm, previously 0.9 cm with maximum SUV 1.9, previously 3.5. Significantly reduced size of the left lower neck and mediastinal lymph nodes, Deauville 3 activity. -PET scan on 07/10/2020 showed a 13 mm short axis left supraclavicular node SUV 1.9.  Previous SUV 2.2.  10 mm short axis left superior mediastinal node SUV 1.9, previously 2.0.  No other adenopathy was seen.  Deauville 3 activity.  PLAN:   1. Stage IV classical Hodgkin's disease:  -He has completed prescribed chemotherapy and is recovering very well. --As he did not have bulky disease (none more than 10 cm), Dr Raliegh Ip did not recommend any radiation therapy. -He is here for a FU today, imaging due in  Feb 2022. - No concerning ROS and PE Repeat PET imaging ordered for March 2022. RTC in March with labs and PET imaging results. Discussed  about the symptoms to watch.  2. Peripheral neuropathy: -Resolved  3. Nausea/vomiting: -No more nausea reported.  Orders placed this encounter:  No orders of the defined types were placed in this encounter.  Benay Pike MD

## 2020-10-13 ENCOUNTER — Encounter (HOSPITAL_COMMUNITY): Payer: Self-pay | Admitting: Hematology and Oncology

## 2020-10-13 ENCOUNTER — Inpatient Hospital Stay (HOSPITAL_COMMUNITY): Payer: BC Managed Care – PPO | Admitting: Hematology and Oncology

## 2020-10-13 ENCOUNTER — Inpatient Hospital Stay (HOSPITAL_COMMUNITY): Payer: BC Managed Care – PPO | Attending: Hematology

## 2020-10-13 ENCOUNTER — Other Ambulatory Visit: Payer: Self-pay

## 2020-10-13 VITALS — BP 130/73 | HR 72 | Temp 98.2°F | Resp 18 | Wt 273.7 lb

## 2020-10-13 DIAGNOSIS — R112 Nausea with vomiting, unspecified: Secondary | ICD-10-CM | POA: Diagnosis not present

## 2020-10-13 DIAGNOSIS — Z79899 Other long term (current) drug therapy: Secondary | ICD-10-CM | POA: Insufficient documentation

## 2020-10-13 DIAGNOSIS — C8118 Nodular sclerosis classical Hodgkin lymphoma, lymph nodes of multiple sites: Secondary | ICD-10-CM

## 2020-10-13 DIAGNOSIS — G629 Polyneuropathy, unspecified: Secondary | ICD-10-CM | POA: Insufficient documentation

## 2020-10-13 DIAGNOSIS — C8190 Hodgkin lymphoma, unspecified, unspecified site: Secondary | ICD-10-CM | POA: Diagnosis not present

## 2020-10-13 DIAGNOSIS — I10 Essential (primary) hypertension: Secondary | ICD-10-CM | POA: Diagnosis not present

## 2020-10-13 LAB — CBC WITH DIFFERENTIAL/PLATELET
Abs Immature Granulocytes: 0.01 10*3/uL (ref 0.00–0.07)
Basophils Absolute: 0.1 10*3/uL (ref 0.0–0.1)
Basophils Relative: 1 %
Eosinophils Absolute: 0.1 10*3/uL (ref 0.0–0.5)
Eosinophils Relative: 2 %
HCT: 42.7 % (ref 39.0–52.0)
Hemoglobin: 14.4 g/dL (ref 13.0–17.0)
Immature Granulocytes: 0 %
Lymphocytes Relative: 29 %
Lymphs Abs: 1.8 10*3/uL (ref 0.7–4.0)
MCH: 29.2 pg (ref 26.0–34.0)
MCHC: 33.7 g/dL (ref 30.0–36.0)
MCV: 86.6 fL (ref 80.0–100.0)
Monocytes Absolute: 0.5 10*3/uL (ref 0.1–1.0)
Monocytes Relative: 8 %
Neutro Abs: 3.7 10*3/uL (ref 1.7–7.7)
Neutrophils Relative %: 60 %
Platelets: 291 10*3/uL (ref 150–400)
RBC: 4.93 MIL/uL (ref 4.22–5.81)
RDW: 13.6 % (ref 11.5–15.5)
WBC: 6.1 10*3/uL (ref 4.0–10.5)
nRBC: 0 % (ref 0.0–0.2)

## 2020-10-13 LAB — COMPREHENSIVE METABOLIC PANEL
ALT: 25 U/L (ref 0–44)
AST: 13 U/L — ABNORMAL LOW (ref 15–41)
Albumin: 4.1 g/dL (ref 3.5–5.0)
Alkaline Phosphatase: 65 U/L (ref 38–126)
Anion gap: 7 (ref 5–15)
BUN: 18 mg/dL (ref 6–20)
CO2: 26 mmol/L (ref 22–32)
Calcium: 9 mg/dL (ref 8.9–10.3)
Chloride: 104 mmol/L (ref 98–111)
Creatinine, Ser: 0.93 mg/dL (ref 0.61–1.24)
GFR, Estimated: >60 mL/min (ref >60)
Glucose, Bld: 107 mg/dL — ABNORMAL HIGH (ref 70–99)
Potassium: 4.4 mmol/L (ref 3.5–5.1)
Sodium: 137 mmol/L (ref 135–145)
Total Bilirubin: 0.3 mg/dL (ref 0.3–1.2)
Total Protein: 7.3 g/dL (ref 6.5–8.1)

## 2020-10-13 LAB — URIC ACID: Uric Acid, Serum: 6.6 mg/dL (ref 3.7–8.6)

## 2020-10-13 LAB — MAGNESIUM: Magnesium: 2 mg/dL (ref 1.7–2.4)

## 2020-10-13 LAB — LACTATE DEHYDROGENASE: LDH: 156 U/L (ref 98–192)

## 2020-10-16 ENCOUNTER — Ambulatory Visit: Payer: BC Managed Care – PPO | Admitting: Family Medicine

## 2020-10-16 ENCOUNTER — Other Ambulatory Visit: Payer: Self-pay

## 2020-10-16 ENCOUNTER — Encounter: Payer: Self-pay | Admitting: Family Medicine

## 2020-10-16 VITALS — BP 134/78 | HR 94 | Temp 97.1°F | Ht 75.0 in | Wt 274.0 lb

## 2020-10-16 DIAGNOSIS — Z9189 Other specified personal risk factors, not elsewhere classified: Secondary | ICD-10-CM | POA: Diagnosis not present

## 2020-10-16 DIAGNOSIS — R0683 Snoring: Secondary | ICD-10-CM

## 2020-10-16 DIAGNOSIS — I10 Essential (primary) hypertension: Secondary | ICD-10-CM | POA: Diagnosis not present

## 2020-10-16 DIAGNOSIS — R400 Somnolence: Secondary | ICD-10-CM

## 2020-10-16 NOTE — Progress Notes (Signed)
Patient ID: Tyler [name], male    DOB: 12/01/1995, 24 y.o.   MRN: [id number]   Chief Complaint  Patient presents with  . Snoring  . Apnea   Subjective:    HPI  CC- snoring/ fatigue.  would like to schedule sleep study. Pt states he snores, feels tired all the time. Has dosed off while driving.  Had referral in past for sleep study, didn't get it.  Snoring for years.  Fiance noticing it and sleeping in other room. Not refreshed, hard to wake up and groggy.  Falling asleep some times occ when coming home from work.  Pt finished up chemo.  Had appt in dec with oncology. F/u in 63mo for repeat CT scan and PET scan. With hunting the port is bothering him. Feeling good after treatments.     Medical History Josmar has a past medical history of ADD (attention deficit disorder), Cyclic vomiting syndrome (2009), GERD (gastroesophageal reflux disease), and Hypertension.   Outpatient Encounter Medications as of 10/16/2020  Medication Sig  . amLODipine (NORVASC) 10 MG tablet Take 1 tablet (10 mg total) by mouth daily.  Marland Kitchen omeprazole (PRILOSEC OTC) 20 MG tablet Take 1 tablet (20 mg total) by mouth daily.  . sodium chloride (OCEAN) 0.65 % SOLN nasal spray Place 1 spray into both nostrils as needed for congestion.  . vinBLAStine in sodium chloride 0.9 % 50 mL Inject into the vein. Day 1, 15 every 28 days  . DACARBAZINE IV Inject into the vein every 14 (fourteen) days. Day 1, 15 every 28 days (Patient not taking: No sig reported)  . DOXORUBICIN HCL IV Inject into the vein every 14 (fourteen) days. Day 1, 15 every 28 days (Patient not taking: No sig reported)  . lidocaine-prilocaine (EMLA) cream Apply small amount over port site and cover with plastic wrap 1 hour before appointment. (Patient not taking: No sig reported)  . ondansetron (ZOFRAN) 8 MG tablet Take 1 tablet (8 mg total) by mouth every 8 (eight) hours as needed for nausea or vomiting. (Patient not taking: No sig reported)  .  prochlorperazine (COMPAZINE) 10 MG tablet Take 1 tablet (10 mg total) by mouth every 6 (six) hours as needed (Nausea or vomiting). (Patient not taking: No sig reported)  . [DISCONTINUED] acetaminophen (TYLENOL) 325 MG tablet Take 650 mg by mouth 2 (two) times daily as needed for moderate pain or fever.  . [DISCONTINUED] bleomycin in sodium chloride 0.9 % 50 mL Inject into the vein every 14 (fourteen) days. Day 1, 15 every 28 days (Patient not taking: No sig reported)   Facility-Administered Encounter Medications as of 10/16/2020  Medication  . sodium chloride flush (NS) 0.9 % injection 10 mL     Review of Systems  Constitutional: Negative for chills and fever.  HENT: Negative for congestion, rhinorrhea and sore throat.   Respiratory: Positive for apnea. Negative for cough, shortness of breath and wheezing.        +snoring.  Cardiovascular: Negative for chest pain and leg swelling.  Gastrointestinal: Negative for abdominal pain, diarrhea, nausea and vomiting.  Genitourinary: Negative for dysuria and frequency.  Skin: Negative for rash.  Neurological: Negative for dizziness, weakness and headaches.     Vitals BP 134/78   Pulse 94   Temp (!) 97.1 F (36.2 C)   Ht 6\' 3"  (1.905 m)   Wt 274 lb (124.3 kg)   SpO2 96%   BMI 34.25 kg/m   Objective:   Physical Exam Vitals and nursing  note reviewed.  Constitutional:      General: He is not in acute distress.    Appearance: Normal appearance. He is not ill-appearing.  HENT:     Head: Normocephalic.     Nose: Nose normal. No congestion.     Mouth/Throat:     Mouth: Mucous membranes are moist.     Pharynx: No oropharyngeal exudate or posterior oropharyngeal erythema.  Eyes:     Extraocular Movements: Extraocular movements intact.     Conjunctiva/sclera: Conjunctivae normal.     Pupils: Pupils are equal, round, and reactive to light.  Cardiovascular:     Rate and Rhythm: Normal rate and regular rhythm.     Pulses: Normal pulses.      Heart sounds: Normal heart sounds. No murmur heard.   Pulmonary:     Effort: Pulmonary effort is normal.     Breath sounds: Normal breath sounds. No wheezing, rhonchi or rales.  Musculoskeletal:        General: Normal range of motion.     Cervical back: Normal range of motion.     Right lower leg: No edema.     Left lower leg: No edema.  Skin:    General: Skin is warm and dry.     Findings: No rash.  Neurological:     General: No focal deficit present.     Mental Status: He is alert and oriented to person, place, and time.     Cranial Nerves: No cranial nerve deficit.  Psychiatric:        Mood and Affect: Mood normal.        Behavior: Behavior normal.        Thought Content: Thought content normal.        Judgment: Judgment normal.      Assessment and Plan   1. Snoring  2. At risk for sleep apnea  3. Hypertension, unspecified type  4. Daytime somnolence    Order sleep study.  Pt to cont bp meds.   F/u 71mo or prn.

## 2020-10-19 DIAGNOSIS — Z029 Encounter for administrative examinations, unspecified: Secondary | ICD-10-CM

## 2020-12-09 IMAGING — US IR IMAGING GUIDED PORT INSERTION
1 series · 1 of 1 positions shown · non-contrast
Comparison: none

CLINICAL DATA: Adenopathy, probable lymphoma. Durable venous access
requested for planned chemotherapy.
TECHNIQUE: The procedure, risks, benefits, and alternatives were explained to
the patient. Questions regarding the procedure were encouraged and
answered. The patient understands and consents to the procedure.

[Series 1: ir imaging guided port insertion · 1 of 1 slices shown]
[im 1/1]
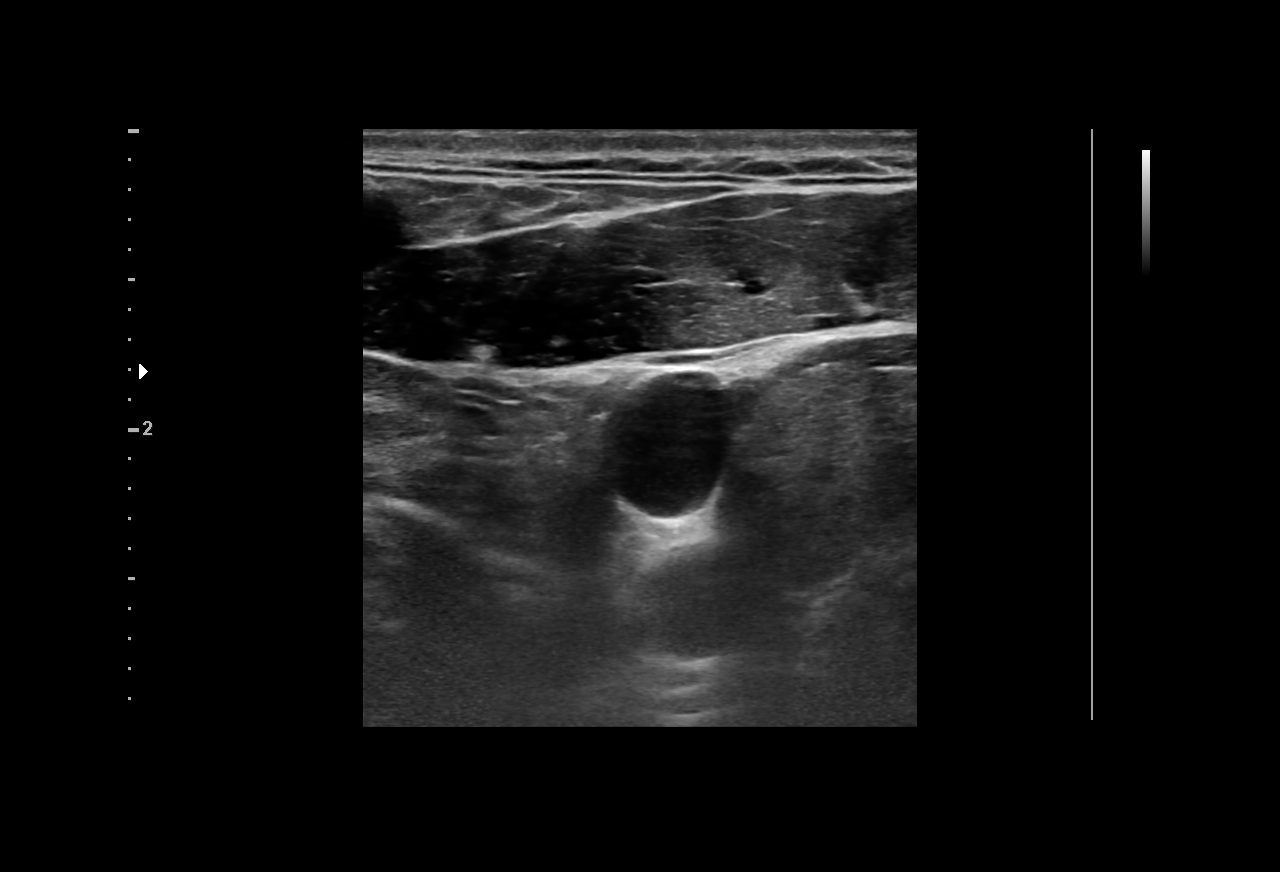

[1 of 1 positions shown; findings below may reference images not displayed]

EXAM:
TUNNELED PORT CATHETER PLACEMENT WITH ULTRASOUND AND FLUOROSCOPIC
GUIDANCE

FLUOROSCOPY TIME:  0.1 minute; 11  uWym1 DAP

ANESTHESIA/SEDATION:
Intravenous Fentanyl 944mcg and Versed 3mg were administered as
conscious sedation during continuous monitoring of the patient's
level of consciousness and physiological / cardiorespiratory status
by the radiology RN, with a total moderate sedation time of 17
minutes.
As antibiotic prophylaxis, cefazolin 2 g was ordered pre-procedure
and administered intravenously within one hour of incision.

Patency of the right IJ vein was confirmed with ultrasound with
image documentation. An appropriate skin site was determined. Skin
site was marked. Region was prepped using maximum barrier technique
including cap and mask, sterile gown, sterile gloves, large sterile
sheet, and Chlorhexidine as cutaneous antisepsis. The region was
infiltrated locally with 1% lidocaine. Under real-time ultrasound
guidance, the right IJ vein was accessed with a 21 gauge
micropuncture needle; the needle tip within the vein was confirmed
with ultrasound image documentation. Needle was exchanged over a 018
guidewire for transitional dilator, and vascular measurement was
performed.

A small incision was made on the right anterior chest wall and a
subcutaneous pocket fashioned. The power-injectable port was
positioned and its catheter tunneled to the right IJ dermatotomy
site. The transitional dilator was exchanged over an Amplatz wire
for a peel-away sheath, through which the port catheter, which had
been trimmed to the appropriate length, was advanced and positioned
under fluoroscopy with its tip at the cavoatrial junction. Spot
chest radiograph confirms good catheter position and no
pneumothorax. The port was flushed per protocol. The pocket was
closed with deep interrupted and subcuticular continuous 3-0
Monocryl sutures. The incisions were covered with Dermabond then
covered with a sterile dressing.

The patient tolerated the procedure well.

COMPLICATIONS:
COMPLICATIONS
None immediate
IMPRESSION: Technically successful right IJ power-injectable port catheter
placement. Ready for routine use.

## 2020-12-15 ENCOUNTER — Encounter (HOSPITAL_COMMUNITY): Payer: BC Managed Care – PPO

## 2021-01-09 ENCOUNTER — Ambulatory Visit (HOSPITAL_COMMUNITY): Payer: Managed Care, Other (non HMO)

## 2021-01-15 ENCOUNTER — Inpatient Hospital Stay (HOSPITAL_COMMUNITY): Payer: Managed Care, Other (non HMO)

## 2021-01-18 ENCOUNTER — Other Ambulatory Visit (HOSPITAL_COMMUNITY): Payer: Self-pay

## 2021-01-18 DIAGNOSIS — C8118 Nodular sclerosis classical Hodgkin lymphoma, lymph nodes of multiple sites: Secondary | ICD-10-CM

## 2021-01-18 NOTE — Progress Notes (Signed)
t

## 2021-01-22 ENCOUNTER — Ambulatory Visit (HOSPITAL_COMMUNITY): Payer: BC Managed Care – PPO | Admitting: Hematology

## 2021-01-30 ENCOUNTER — Telehealth: Payer: Self-pay | Admitting: Family Medicine

## 2021-01-30 MED ORDER — AMLODIPINE BESYLATE 10 MG PO TABS: 10.0000 mg | ORAL_TABLET | Freq: Every day | ORAL | 0 refills | Status: DC

## 2021-01-30 NOTE — Telephone Encounter (Signed)
Patient has appointment schedule for 4/28 your first available afternoon but needing his amlodipine 10 mg called into [facility]- [place]

## 2021-01-30 NOTE — Telephone Encounter (Signed)
Prescription sent electronically to the pharmacy.   Left message to return call 

## 2021-01-31 ENCOUNTER — Inpatient Hospital Stay (HOSPITAL_COMMUNITY): Payer: Managed Care, Other (non HMO)

## 2021-02-01 ENCOUNTER — Other Ambulatory Visit: Payer: Self-pay

## 2021-02-01 ENCOUNTER — Inpatient Hospital Stay (HOSPITAL_COMMUNITY): Payer: Managed Care, Other (non HMO) | Attending: Hematology

## 2021-02-01 DIAGNOSIS — C8190 Hodgkin lymphoma, unspecified, unspecified site: Secondary | ICD-10-CM | POA: Insufficient documentation

## 2021-02-01 DIAGNOSIS — G629 Polyneuropathy, unspecified: Secondary | ICD-10-CM | POA: Diagnosis not present

## 2021-02-01 DIAGNOSIS — R161 Splenomegaly, not elsewhere classified: Secondary | ICD-10-CM | POA: Diagnosis not present

## 2021-02-01 DIAGNOSIS — C8118 Nodular sclerosis classical Hodgkin lymphoma, lymph nodes of multiple sites: Secondary | ICD-10-CM

## 2021-02-01 LAB — COMPREHENSIVE METABOLIC PANEL
ALT: 27 U/L (ref 0–44)
AST: 11 U/L — ABNORMAL LOW (ref 15–41)
Albumin: 4.1 g/dL (ref 3.5–5.0)
Alkaline Phosphatase: 67 U/L (ref 38–126)
Anion gap: 9 (ref 5–15)
BUN: 16 mg/dL (ref 6–20)
CO2: 27 mmol/L (ref 22–32)
Calcium: 9.3 mg/dL (ref 8.9–10.3)
Chloride: 104 mmol/L (ref 98–111)
Creatinine, Ser: 0.99 mg/dL (ref 0.61–1.24)
GFR, Estimated: >60 mL/min (ref >60)
Glucose, Bld: 104 mg/dL — ABNORMAL HIGH (ref 70–99)
Potassium: 4.2 mmol/L (ref 3.5–5.1)
Sodium: 140 mmol/L (ref 135–145)
Total Bilirubin: 0.5 mg/dL (ref 0.3–1.2)
Total Protein: 7.1 g/dL (ref 6.5–8.1)

## 2021-02-01 LAB — CBC WITH DIFFERENTIAL/PLATELET
Abs Immature Granulocytes: 0.01 10*3/uL (ref 0.00–0.07)
Basophils Absolute: 0.1 10*3/uL (ref 0.0–0.1)
Basophils Relative: 1 %
Eosinophils Absolute: 0.1 10*3/uL (ref 0.0–0.5)
Eosinophils Relative: 2 %
HCT: 43.4 % (ref 39.0–52.0)
Hemoglobin: 14.1 g/dL (ref 13.0–17.0)
Immature Granulocytes: 0 %
Lymphocytes Relative: 39 %
Lymphs Abs: 2.4 10*3/uL (ref 0.7–4.0)
MCH: 29.2 pg (ref 26.0–34.0)
MCHC: 32.5 g/dL (ref 30.0–36.0)
MCV: 89.9 fL (ref 80.0–100.0)
Monocytes Absolute: 0.5 10*3/uL (ref 0.1–1.0)
Monocytes Relative: 8 %
Neutro Abs: 3 10*3/uL (ref 1.7–7.7)
Neutrophils Relative %: 50 %
Platelets: 277 10*3/uL (ref 150–400)
RBC: 4.83 MIL/uL (ref 4.22–5.81)
RDW: 13.2 % (ref 11.5–15.5)
WBC: 6.1 10*3/uL (ref 4.0–10.5)
nRBC: 0 % (ref 0.0–0.2)

## 2021-02-01 LAB — MAGNESIUM: Magnesium: 2 mg/dL (ref 1.7–2.4)

## 2021-02-01 LAB — PHOSPHORUS: Phosphorus: 4 mg/dL (ref 2.5–4.6)

## 2021-02-01 LAB — URIC ACID: Uric Acid, Serum: 6.6 mg/dL (ref 3.7–8.6)

## 2021-02-01 LAB — LACTATE DEHYDROGENASE: LDH: 158 U/L (ref 98–192)

## 2021-02-05 ENCOUNTER — Ambulatory Visit (HOSPITAL_COMMUNITY)
Admission: RE | Admit: 2021-02-05 | Discharge: 2021-02-05 | Disposition: A | Discharge status: Home / Self Care | Payer: Managed Care, Other (non HMO) | Source: Ambulatory Visit | Attending: Hematology | Admitting: Hematology

## 2021-02-05 ENCOUNTER — Encounter (HOSPITAL_COMMUNITY): Payer: Self-pay

## 2021-02-05 ENCOUNTER — Other Ambulatory Visit: Payer: Self-pay

## 2021-02-05 ENCOUNTER — Ambulatory Visit (HOSPITAL_COMMUNITY): Payer: Self-pay

## 2021-02-05 DIAGNOSIS — C8118 Nodular sclerosis classical Hodgkin lymphoma, lymph nodes of multiple sites: Secondary | ICD-10-CM | POA: Diagnosis not present

## 2021-02-05 MED ORDER — IOHEXOL 300 MG/ML  SOLN
100.0000 mL | Freq: Once | INTRAMUSCULAR | Status: AC | PRN
  Administered 2021-02-05: 100 mL via INTRAVENOUS

## 2021-02-07 ENCOUNTER — Inpatient Hospital Stay (HOSPITAL_COMMUNITY): Payer: Managed Care, Other (non HMO) | Admitting: Hematology

## 2021-02-07 ENCOUNTER — Other Ambulatory Visit: Payer: Self-pay

## 2021-02-07 ENCOUNTER — Inpatient Hospital Stay (HOSPITAL_COMMUNITY): Payer: Managed Care, Other (non HMO)

## 2021-02-07 VITALS — BP 128/78 | HR 83 | Temp 97.0°F | Resp 18 | Wt 278.9 lb

## 2021-02-07 DIAGNOSIS — C8190 Hodgkin lymphoma, unspecified, unspecified site: Secondary | ICD-10-CM | POA: Diagnosis not present

## 2021-02-07 DIAGNOSIS — C8118 Nodular sclerosis classical Hodgkin lymphoma, lymph nodes of multiple sites: Secondary | ICD-10-CM

## 2021-02-07 MED ORDER — HEPARIN SOD (PORK) LOCK FLUSH 100 UNIT/ML IV SOLN
500.0000 [IU] | Freq: Once | INTRAVENOUS | Status: AC
  Administered 2021-02-07: 500 [IU] via INTRAVENOUS

## 2021-02-07 MED ORDER — SODIUM CHLORIDE 0.9% FLUSH: 10.0000 mL | Freq: Two times a day (BID) | INTRAVENOUS | Status: DC

## 2021-02-07 MED ORDER — SODIUM CHLORIDE 0.9% FLUSH
10.0000 mL | INTRAVENOUS | Status: DC | PRN
  Administered 2021-02-07: 10 mL via INTRAVENOUS

## 2021-02-07 NOTE — Patient Instructions (Signed)
[place] at [facility] Discharge Instructions  You were seen today by Dr. Delton Coombes. He went over your recent results and scans. Continue getting your port flushes every 3 months. Dr. Delton Coombes will see you back in 4 months for labs and follow up.   Thank you for choosing [place] at [facility] to provide your oncology and hematology care.  To afford each patient quality time with our provider, please arrive at least 15 minutes before your scheduled appointment time.   If you have a lab appointment with the [facility] please come in thru the Main Entrance and check in at the main information desk  You need to re-schedule your appointment should you arrive 10 or more minutes late.  We strive to give you quality time with our providers, and arriving late affects you and other patients whose appointments are after yours.  Also, if you no show three or more times for appointments you may be dismissed from the clinic at the providers discretion.     Again, thank you for choosing [facility].  Our hope is that these requests will decrease the amount of time that you wait before being seen by our physicians.       _____________________________________________________________  Should you have questions after your visit to [facility], please contact our office at [phone] between the hours of 8:00 a.m. and 4:30 p.m.  Voicemails left after 4:00 p.m. will not be returned until the following business day.  For prescription refill requests, have your pharmacy contact our office and allow 72 hours.    Cancer Center Support Programs:   > Cancer Support Group  2nd Tuesday of the month 1pm-2pm, Journey Room

## 2021-02-07 NOTE — Progress Notes (Signed)
Tyler [name], [place] [id number]   CLINIC:  Medical Oncology/Hematology  PCP:  Tyler Colla, DO [address] / [place] [address]  [phone]  REASON FOR VISIT:  Follow-up for Hodgkin's disease  PRIOR THERAPY: ABVD x 6 cycles from 01/12/2020 to 06/14/2020  CURRENT THERAPY: Surveillance  INTERVAL HISTORY:  [name], a 25 y.o. male, returns for routine follow-up for his Hodgkin's disease. Said was last seen on 10/13/2020.  Today he reports feeling well. He continues having intermittent numbness in his left arm, most prominent when he holds it horizontally or higher, such as gripping the steering wheel or propping up a wall. He denies having nausea.   REVIEW OF SYSTEMS:  Review of Systems  Constitutional: Negative for appetite change and fatigue.  Gastrointestinal: Negative for nausea.  Neurological: Positive for numbness (numbness in L arm).  All other systems reviewed and are negative.   PAST MEDICAL/SURGICAL HISTORY:  Past Medical History:  Diagnosis Date  . ADD (attention deficit disorder)    Inattention type  . Cyclic vomiting syndrome 2009  . GERD (gastroesophageal reflux disease)   . Hypertension    Past Surgical History:  Procedure Laterality Date  . closed reduction rt. wrist    . IR IMAGING GUIDED PORT INSERTION  12/31/2019  . MASS BIOPSY Left 01/03/2020   Procedure: OPEN NECK BIOPSY;  Surgeon: Leta Baptist, MD;  Location: [facility];  Service: ENT;  Laterality: Left;    SOCIAL HISTORY:  Social History   Socioeconomic History  . Marital status: Single    Spouse name: Not on file  . Number of children: 0  . Years of education: Not on file  . Highest education level: Not on file  Occupational History  . Not on file  Tobacco Use  . Smoking status: Never Smoker  . Smokeless tobacco: Former Network engineer  . Vaping Use: Never used  Substance and Sexual Activity  . Alcohol use: Never  .  Drug use: Never  . Sexual activity: Not Currently  Other Topics Concern  . Not on file  Social History Narrative  . Not on file   Social Determinants of Health   Financial Resource Strain: Not on file  Food Insecurity: Not on file  Transportation Needs: Not on file  Physical Activity: Not on file  Stress: Not on file  Social Connections: Not on file  Intimate Partner Violence: Not on file    FAMILY HISTORY:  Family History  Problem Relation Age of Onset  . Cancer Paternal Grandmother   . Hypertension Mother   . Hypertension Father     CURRENT MEDICATIONS:  Current Outpatient Medications  Medication Sig Dispense Refill  . amLODipine (NORVASC) 10 MG tablet Take 1 tablet (10 mg total) by mouth daily. 30 tablet 0  . omeprazole (PRILOSEC OTC) 20 MG tablet Take 1 tablet (20 mg total) by mouth daily. 90 tablet 1  . sodium chloride (OCEAN) 0.65 % SOLN nasal spray Place 1 spray into both nostrils as needed for congestion.    . lidocaine-prilocaine (EMLA) cream Apply small amount over port site and cover with plastic wrap 1 hour before appointment. (Patient not taking: No sig reported) 30 g 3  . ondansetron (ZOFRAN) 8 MG tablet Take 1 tablet (8 mg total) by mouth every 8 (eight) hours as needed for nausea or vomiting. (Patient not taking: No sig reported) 30 tablet 0  . prochlorperazine (COMPAZINE) 10 MG  tablet Take 1 tablet (10 mg total) by mouth every 6 (six) hours as needed (Nausea or vomiting). (Patient not taking: No sig reported) 45 tablet 2   No current facility-administered medications for this visit.   Facility-Administered Medications Ordered in Other Visits  Medication Dose Route Frequency Provider Last Rate Last Admin  . sodium chloride flush (NS) 0.9 % injection 10 mL  10 mL Intravenous PRN Derek Jack, MD   10 mL at 05/19/20 1033    ALLERGIES:  Allergies  Allergen Reactions  . Ace Inhibitors Swelling    PHYSICAL EXAM:  Performance status (ECOG): 1 -  Symptomatic but completely ambulatory  Vitals:   02/07/21 1512  BP: 128/78  Pulse: 83  Resp: 18  Temp: (!) 97 F (36.1 C)  SpO2: 96%   Wt Readings from Last 3 Encounters:  02/07/21 278 lb 14.1 oz (126.5 kg)  10/16/20 274 lb (124.3 kg)  10/13/20 273 lb 11.2 oz (124.1 kg)   Physical Exam Vitals reviewed.  Constitutional:      Appearance: Normal appearance. He is obese.  Cardiovascular:     Rate and Rhythm: Normal rate and regular rhythm.     Pulses: Normal pulses.     Heart sounds: Normal heart sounds.  Pulmonary:     Effort: Pulmonary effort is normal.     Breath sounds: Normal breath sounds.  Chest:  Breasts:     Right: No axillary adenopathy or supraclavicular adenopathy.     Left: No axillary adenopathy or supraclavicular adenopathy.    Abdominal:     Palpations: Abdomen is soft. There is no hepatomegaly, splenomegaly or mass.     Tenderness: There is no abdominal tenderness.  Musculoskeletal:     Right lower leg: No edema.     Left lower leg: No edema.  Lymphadenopathy:     Cervical: No cervical adenopathy.     Right cervical: No superficial cervical adenopathy.    Left cervical: No superficial cervical adenopathy.     Upper Body:     Right upper body: No supraclavicular, axillary or pectoral adenopathy.     Left upper body: No supraclavicular, axillary or pectoral adenopathy.  Neurological:     General: No focal deficit present.     Mental Status: He is alert and oriented to person, place, and time.  Psychiatric:        Mood and Affect: Mood normal.        Behavior: Behavior normal.     LABORATORY DATA:  I have reviewed the labs as listed.  CBC Latest Ref Rng & Units 02/01/2021 10/13/2020 07/13/2020  WBC 4.0 - 10.5 K/uL 6.1 6.1 6.9  Hemoglobin 13.0 - 17.0 g/dL 14.1 14.4 12.0(L)  Hematocrit 39.0 - 52.0 % 43.4 42.7 38.0(L)  Platelets 150 - 400 K/uL 277 291 300   CMP Latest Ref Rng & Units 02/01/2021 10/13/2020 07/13/2020  Glucose 70 - 99 mg/dL 104(H) 107(H)  115(H)  BUN 6 - 20 mg/dL 16 18 16   Creatinine 0.61 - 1.24 mg/dL 0.99 0.93 0.87  Sodium 135 - 145 mmol/L 140 137 137  Potassium 3.5 - 5.1 mmol/L 4.2 4.4 3.7  Chloride 98 - 111 mmol/L 104 104 104  CO2 22 - 32 mmol/L 27 26 25   Calcium 8.9 - 10.3 mg/dL 9.3 9.0 8.8(L)  Total Protein 6.5 - 8.1 g/dL 7.1 7.3 6.9  Total Bilirubin 0.3 - 1.2 mg/dL 0.5 0.3 0.4  Alkaline Phos 38 - 126 U/L 67 65 62  AST 15 - 41  U/L 11(L) 13(L) 14(L)  ALT 0 - 44 U/L 27 25 31       Component Value Date/Time   RBC 4.83 02/01/2021 0810   MCV 89.9 02/01/2021 0810   MCV 84 12/24/2019 1556   MCH 29.2 02/01/2021 0810   MCHC 32.5 02/01/2021 0810   RDW 13.2 02/01/2021 0810   RDW 12.9 12/24/2019 1556   LYMPHSABS 2.4 02/01/2021 0810   LYMPHSABS 2.0 12/24/2019 1556   MONOABS 0.5 02/01/2021 0810   EOSABS 0.1 02/01/2021 0810   EOSABS 0.1 12/24/2019 1556   BASOSABS 0.1 02/01/2021 0810   BASOSABS 0.1 12/24/2019 1556   Lab Results  Component Value Date   LDH 158 02/01/2021   LDH 156 10/13/2020   LDH 164 07/13/2020    DIAGNOSTIC IMAGING:  I have independently reviewed the scans and discussed with the patient. 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 Hematologic malignancy, surveillance; follow-up options lymphoma. Additional history provided by scanning technologist: Patient reports having completed chemotherapy, no current complaints. EXAM: CT NECK WITH CONTRAST TECHNIQUE: Multidetector CT imaging of the neck was performed using the standard protocol following the bolus administration of intravenous contrast. CONTRAST:  148mL OMNIPAQUE IOHEXOL 300 MG/ML  SOLN COMPARISON:  Prior PET-CT examinations 07/10/2020 and earlier. Neck CT 12/27/2019. FINDINGS: Pharynx and larynx: No appreciable mass within the oral cavity, pharynx or larynx. Postinflammatory calcifications within the palatine tonsils bilaterally. Salivary glands: No  inflammation, mass, or stone. Thyroid: Unremarkable. Lymph nodes: Interval decrease in size of previously enlarged left supraclavicular lymph nodes. For instance, a left supraclavicular lymph node on image 41 of series 10 now measures 9 mm in short axis (previously 13 mm). As before, lymph nodes elsewhere within the neck are somewhat prominent in number, but measure subcentimeter in short axis. Vascular: The major vascular structures of the neck are patent. Limited intracranial: No evidence of acute intracranial abnormality within the field of view. Visualized orbits: Incompletely imaged. No mass or acute finding at the imaged levels. Mastoids and visualized paranasal sinuses: Small right maxillary sinus mucous retention cyst. No significant mastoid effusion. Skeleton: No acute bony abnormality or aggressive osseous lesion. Upper chest: Separately reported. No consolidation within the imaged lung apices. Partially imaged right chest infusion port catheter. IMPRESSION: Since the prior PET-CT of 07/10/2020, there has been a continued interval decrease in size of previously enlarged left supraclavicular lymph nodes, now measuring up to 8 mm in short axis (previously measuring up to 13 mm in short axis). As before, lymph nodes elsewhere within the neck are somewhat prominent in number, but measure subcentimeter in short axis. Same day CT chest/abdomen/pelvis reported separately. Electronically Signed   By: Kellie Simmering DO   On: 02/06/2021 17:06   CT CHEST ABDOMEN PELVIS W CONTRAST  Result Date: 02/07/2021 CLINICAL DATA:  Follow-up Hodgkin's lymphoma, chemotherapy completed EXAM: CT CHEST, ABDOMEN, AND PELVIS WITH CONTRAST TECHNIQUE: Multidetector CT imaging of the chest, abdomen and pelvis was performed following the standard protocol during bolus administration of intravenous contrast. CONTRAST:  177mL OMNIPAQUE IOHEXOL 300 MG/ML SOLN, additional oral enteric contrast COMPARISON:  PET-CT, 07/10/2020 FINDINGS: CT  CHEST FINDINGS Cardiovascular: Right chest port catheter. Normal heart size. No pericardial effusion. Mediastinum/Nodes: Enlarged left supraclavicular and superior mediastinal lymph nodes are slightly diminished in size compared to prior examination, index supraclavicular node measuring 1.3 x 0.8 cm, previously 2.2 x 1.3 cm (series 4, image 5). No other enlarged mediastinal, hilar,  or axillary lymph nodes. Small hiatal hernia. Thymic remnant in the anterior mediastinum. Thyroid gland, trachea, and esophagus demonstrate no significant findings. Lungs/Pleura: Lungs are clear. No pleural effusion or pneumothorax. Musculoskeletal: No chest wall mass or suspicious bone lesions identified. CT ABDOMEN PELVIS FINDINGS Hepatobiliary: No solid liver abnormality is seen. No gallstones, gallbladder wall thickening, or biliary dilatation. Pancreas: Unremarkable. No pancreatic ductal dilatation or surrounding inflammatory changes. Spleen: Mild splenomegaly, maximum coronal span 13.8 cm. Adrenals/Urinary Tract: Adrenal glands are unremarkable. Kidneys are normal, without renal calculi, solid lesion, or hydronephrosis. Bladder is unremarkable. Stomach/Bowel: Stomach is within normal limits. Appendix appears normal. No evidence of bowel wall thickening, distention, or inflammatory changes. Vascular/Lymphatic: No significant vascular findings are present. No enlarged abdominal or pelvic lymph nodes. Reproductive: No mass or other abnormality. Other: No abdominal wall hernia or abnormality. No abdominopelvic ascites. Musculoskeletal: No acute or significant osseous findings. IMPRESSION: 1. Enlarged left supraclavicular and superior mediastinal lymph nodes are slightly diminished in size compared to prior examination, consistent with treatment response. 2. No evidence of new lymphadenopathy in the chest, abdomen, or pelvis. 3. Mild splenomegaly, maximum coronal span 13.8 cm. Electronically Signed   By: Eddie Candle M.D.   On:  02/07/2021 08:59     ASSESSMENT:  1. Stage IV AE classical Hodgkin's disease: -PET scan on 01/05/2020 shows hypermetabolic left cervical, supraclavicular, bilateral mediastinal adenopathy.  --Hypermetabolic porta hepatic lymph nodes.  --Mildly increased FDG uptake associated with spleen which is above liver activity. Diffuse increase uptake in the axial and proximal appendicular skeleton. -IPI score of 3. -6 cycles of ABVD from 01/12/2020 through 06/14/2020. -PET2on 03/06/2010 shows left lower neck lymph node decreased in size and metabolic some. Measures 2 cm, previously 3.7 cm with SUV 1.9, previously 14.5. 1.6 cm left supraclavicular lymph node with SUV 3.0, previously 2.6 cm with SUV 9.8. Left prevascular lymph node decreased to 1.9 cm from 5.3 cm. Right prevascular lymph node decreased to 0.8 cm, previously 2.3 cm. Borderline prominent 0.9 cm right external iliac lymph node with mild hypermetabolism with maximum SUV 3.5 new. -The right external iliac lymph node is likely reactive. I have recommended continuing 2 more cycles of ABVD and repeat PET scan after cycle 4. -PET4on 04/28/2020 shows previously described right external iliac lymph node measures 0.8 cm, previously 0.9 cm with maximum SUV 1.9, previously 3.5. Significantly reduced size of the left lower neck and mediastinal lymph nodes, Deauville 3 activity. -PET scan on 07/10/2020 showed a 13 mm short axis left supraclavicular node SUV 1.9.  Previous SUV 2.2.  10 mm short axis left superior mediastinal node SUV 1.9, previously 2.0.  No other adenopathy was seen.  Deauville 3 activity. -XRT was not offered as he did not have bulky disease (more than 10 cm). -CT CAP on 02/05/2021 showed left supraclavicular and superior mediastinal lymph node slightly diminished in size compared to prior exam.  Supraclavicular lymph node measures 1.3 x 0.8 cm previously 2.2 x 1.3 cm.  No other adenopathy.  Mild splenomegaly, maximum coronal span 13.8  cm.   PLAN:  1. Stage IV classical Hodgkin's disease: -He does not have any B symptoms. -Reviewed CT neck, CAP from 02/05/2021. -Supraclavicular lymph node has decreased in size measuring 1.3 x 0.8 cm on the left side.  Mild splenomegaly with maximal coronal span 13.8 cm noted. -Reviewed labs which showed normal CBC and LDH.  LFTs were grossly normal.  Magnesium and potassium were normal. -I think the mild splenomegaly was seen on pretreatment PET  scan and was not conclusive of involvement. -He inquired about when the port can be discontinued.  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 -RTC 4 months in August with physical exam and labs.  2. Peripheral neuropathy: -He has on and off numbness in the left hand fingertips when he raises his arm while driving the car.  No neuropathic pains reported.    Orders placed this encounter:  No orders of the defined types were placed in this encounter.    Derek Jack, MD [facility] [phone]   I, Milinda Antis, am acting as a scribe for Dr. Sanda Linger.  I, Derek Jack MD, have reviewed the above documentation for accuracy and completeness, and I agree with the above.

## 2021-02-07 NOTE — Progress Notes (Signed)
Tyler [name] presented for [facility] access and flush and de-accessed. Portacath flushed with 67ml NS and 69ml Heparin. Blood return noted needle removed intact. Patient tolerated procedure well and without incident.  No questions or complaints noted at this time.Pt discharged ambulatory in satisfactory condition accompanied by a family member.

## 2021-02-22 ENCOUNTER — Ambulatory Visit: Payer: BC Managed Care – PPO | Admitting: Family Medicine

## 2021-03-06 ENCOUNTER — Ambulatory Visit (INDEPENDENT_AMBULATORY_CARE_PROVIDER_SITE_OTHER): Payer: Managed Care, Other (non HMO) | Admitting: Family Medicine

## 2021-03-06 ENCOUNTER — Other Ambulatory Visit: Payer: Self-pay

## 2021-03-06 VITALS — BP 139/89 | HR 92 | Temp 98.1°F | Wt 279.0 lb

## 2021-03-06 DIAGNOSIS — I10 Essential (primary) hypertension: Secondary | ICD-10-CM | POA: Diagnosis not present

## 2021-03-06 DIAGNOSIS — C8118 Nodular sclerosis classical Hodgkin lymphoma, lymph nodes of multiple sites: Secondary | ICD-10-CM

## 2021-03-06 MED ORDER — AMLODIPINE BESYLATE 10 MG PO TABS: 10.0000 mg | ORAL_TABLET | Freq: Every day | ORAL | 1 refills | Status: DC

## 2021-03-06 NOTE — Progress Notes (Signed)
Patient ID: Tyler [name], male    DOB: January 31, 1996, 25 y.o.   MRN: [id number]   Chief Complaint  Patient presents with  . Hypertension    Follow up   Subjective:   HPI Pt seen for f/u htn-  htn- Taking it at night amlodipine. Pt eating salty items before this visit.  Had some snoring and apnea and didn't see the sleep doctor. Still has snoring. Working 16 hrs per day and feeling tired. Now he's not doing that as often.  Things got better with daytime sleepiness. Not falling asleep behind wheel.  Last chemo was 8/21.  Feeling better and seeing oncology for nodular sclerosis hodgkin lymphoma of multiple regions.  Medical History Jaaziel has a past medical history of ADD (attention deficit disorder), Cyclic vomiting syndrome (2009), GERD (gastroesophageal reflux disease), and Hypertension.   Outpatient Encounter Medications as of 03/06/2021  Medication Sig  . amLODipine (NORVASC) 10 MG tablet Take 1 tablet (10 mg total) by mouth daily.  Marland Kitchen lidocaine-prilocaine (EMLA) cream Apply small amount over port site and cover with plastic wrap 1 hour before appointment. (Patient not taking: No sig reported)  . omeprazole (PRILOSEC OTC) 20 MG tablet Take 1 tablet (20 mg total) by mouth daily.  . ondansetron (ZOFRAN) 8 MG tablet Take 1 tablet (8 mg total) by mouth every 8 (eight) hours as needed for nausea or vomiting. (Patient not taking: No sig reported)  . prochlorperazine (COMPAZINE) 10 MG tablet Take 1 tablet (10 mg total) by mouth every 6 (six) hours as needed (Nausea or vomiting). (Patient not taking: No sig reported)  . sodium chloride (OCEAN) 0.65 % SOLN nasal spray Place 1 spray into both nostrils as needed for congestion.  . [DISCONTINUED] amLODipine (NORVASC) 10 MG tablet Take 1 tablet (10 mg total) by mouth daily.   Facility-Administered Encounter Medications as of 03/06/2021  Medication  . sodium chloride flush (NS) 0.9 % injection 10 mL     Review of Systems   Constitutional: Negative for chills and fever.  HENT: Negative for congestion, rhinorrhea and sore throat.   Respiratory: Negative for cough, shortness of breath and wheezing.   Cardiovascular: Negative for chest pain and leg swelling.  Gastrointestinal: Negative for abdominal pain, diarrhea, nausea and vomiting.  Genitourinary: Negative for dysuria and frequency.  Skin: Negative for rash.  Neurological: Negative for dizziness, weakness and headaches.     Vitals BP 139/89   Pulse 92   Temp 98.1 F (36.7 C) (Oral)   Wt 279 lb (126.6 kg)   SpO2 96%   BMI 34.87 kg/m   Objective:   Physical Exam Vitals and nursing note reviewed.  Constitutional:      General: He is not in acute distress.    Appearance: Normal appearance. He is not ill-appearing.  Cardiovascular:     Rate and Rhythm: Normal rate and regular rhythm.     Pulses: Normal pulses.     Heart sounds: Normal heart sounds. No murmur heard.   Pulmonary:     Effort: Pulmonary effort is normal. No respiratory distress.     Breath sounds: Normal breath sounds.  Musculoskeletal:        General: Normal range of motion.  Skin:    General: Skin is warm and dry.     Findings: No rash.  Neurological:     General: No focal deficit present.     Mental Status: He is alert and oriented to person, place, and time.  Psychiatric:  Mood and Affect: Mood normal.        Behavior: Behavior normal.        Thought Content: Thought content normal.        Judgment: Judgment normal.      Assessment and Plan   1. Essential hypertension  2. 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  Pt to dec salt and increase in exercising. Cont with norvasc.  H/o Hodgkin lymphoma- doing well, and cont with oncology.  Completed chemotherapy.  cont to monitor.  Return in about 6 months (around 09/06/2021) for htn.  BP Readings from Last 3 Encounters:  03/06/21 139/89  02/07/21  128/78  10/16/20 134/78

## 2021-03-18 DIAGNOSIS — Z20822 Contact with and (suspected) exposure to covid-19: Secondary | ICD-10-CM | POA: Diagnosis not present

## 2021-05-08 ENCOUNTER — Encounter (HOSPITAL_COMMUNITY): Payer: Managed Care, Other (non HMO)

## 2021-05-22 ENCOUNTER — Inpatient Hospital Stay (HOSPITAL_COMMUNITY): Payer: Managed Care, Other (non HMO) | Attending: Hematology

## 2021-05-22 ENCOUNTER — Other Ambulatory Visit: Payer: Self-pay

## 2021-05-22 DIAGNOSIS — C8190 Hodgkin lymphoma, unspecified, unspecified site: Secondary | ICD-10-CM | POA: Insufficient documentation

## 2021-05-22 DIAGNOSIS — C8118 Nodular sclerosis classical Hodgkin lymphoma, lymph nodes of multiple sites: Secondary | ICD-10-CM

## 2021-05-22 LAB — CBC WITH DIFFERENTIAL/PLATELET
Abs Immature Granulocytes: 0.03 10*3/uL (ref 0.00–0.07)
Basophils Absolute: 0.1 10*3/uL (ref 0.0–0.1)
Basophils Relative: 1 %
Eosinophils Absolute: 0.2 10*3/uL (ref 0.0–0.5)
Eosinophils Relative: 2 %
HCT: 40.6 % (ref 39.0–52.0)
Hemoglobin: 13.5 g/dL (ref 13.0–17.0)
Immature Granulocytes: 0 %
Lymphocytes Relative: 37 %
Lymphs Abs: 3.1 10*3/uL (ref 0.7–4.0)
MCH: 29.5 pg (ref 26.0–34.0)
MCHC: 33.3 g/dL (ref 30.0–36.0)
MCV: 88.8 fL (ref 80.0–100.0)
Monocytes Absolute: 0.6 10*3/uL (ref 0.1–1.0)
Monocytes Relative: 7 %
Neutro Abs: 4.5 10*3/uL (ref 1.7–7.7)
Neutrophils Relative %: 53 %
Platelets: 280 10*3/uL (ref 150–400)
RBC: 4.57 MIL/uL (ref 4.22–5.81)
RDW: 13 % (ref 11.5–15.5)
WBC: 8.4 10*3/uL (ref 4.0–10.5)
nRBC: 0 % (ref 0.0–0.2)

## 2021-05-22 LAB — COMPREHENSIVE METABOLIC PANEL
ALT: 23 U/L (ref 0–44)
AST: 14 U/L — ABNORMAL LOW (ref 15–41)
Albumin: 4.2 g/dL (ref 3.5–5.0)
Alkaline Phosphatase: 73 U/L (ref 38–126)
Anion gap: 7 (ref 5–15)
BUN: 15 mg/dL (ref 6–20)
CO2: 26 mmol/L (ref 22–32)
Calcium: 8.4 mg/dL — ABNORMAL LOW (ref 8.9–10.3)
Chloride: 102 mmol/L (ref 98–111)
Creatinine, Ser: 1.03 mg/dL (ref 0.61–1.24)
GFR, Estimated: >60 mL/min (ref >60)
Glucose, Bld: 100 mg/dL — ABNORMAL HIGH (ref 70–99)
Potassium: 3.7 mmol/L (ref 3.5–5.1)
Sodium: 135 mmol/L (ref 135–145)
Total Bilirubin: 0.4 mg/dL (ref 0.3–1.2)
Total Protein: 7.1 g/dL (ref 6.5–8.1)

## 2021-05-22 LAB — SEDIMENTATION RATE: Sed Rate: 9 mm/hr (ref 0–16)

## 2021-05-22 LAB — LACTATE DEHYDROGENASE: LDH: 156 U/L (ref 98–192)

## 2021-05-22 NOTE — Progress Notes (Signed)
Patients port flushed without difficulty.  Good blood return noted with no bruising or swelling noted at site.  Band aid applied.  VSS with discharge and left in satisfactory condition with no s/s of distress noted.   

## 2021-05-22 NOTE — Patient Instructions (Signed)
[facility]  Discharge Instructions: Thank you for choosing [facility] to provide your oncology and hematology care.  If you have a lab appointment with the Cancer Center, please come in thru the Main Entrance and check in at the main information desk.  Wear comfortable clothing and clothing appropriate for easy access to any Portacath or PICC line.   We strive to give you quality time with your provider. You may need to reschedule your appointment if you arrive late (15 or more minutes).  Arriving late affects you and other patients whose appointments are after yours.  Also, if you miss three or more appointments without notifying the office, you may be dismissed from the clinic at the provider's discretion.      For prescription refill requests, have your pharmacy contact our office and allow 72 hours for refills to be completed.        To help prevent nausea and vomiting after your treatment, we encourage you to take your nausea medication as directed.  BELOW ARE SYMPTOMS THAT SHOULD BE REPORTED IMMEDIATELY: *FEVER GREATER THAN 100.4 F (38 C) OR HIGHER *CHILLS OR SWEATING *NAUSEA AND VOMITING THAT IS NOT CONTROLLED WITH YOUR NAUSEA MEDICATION *UNUSUAL SHORTNESS OF BREATH *UNUSUAL BRUISING OR BLEEDING *URINARY PROBLEMS (pain or burning when urinating, or frequent urination) *BOWEL PROBLEMS (unusual diarrhea, constipation, pain near the anus) TENDERNESS IN MOUTH AND THROAT WITH OR WITHOUT PRESENCE OF ULCERS (sore throat, sores in mouth, or a toothache) UNUSUAL RASH, SWELLING OR PAIN  UNUSUAL VAGINAL DISCHARGE OR ITCHING   Items with * indicate a potential emergency and should be followed up as soon as possible or go to the Emergency Department if any problems should occur.  Please show the CHEMOTHERAPY ALERT CARD or IMMUNOTHERAPY ALERT CARD at check-in to the Emergency Department and triage nurse.  Should you have questions after your visit or need to cancel  or reschedule your appointment, please contact [facility] [phone]  and follow the prompts.  Office hours are 8:00 a.m. to 4:30 p.m. Monday - Friday. Please note that voicemails left after 4:00 p.m. may not be returned until the following business day.  We are closed weekends and major holidays. You have access to a nurse at all times for urgent questions. Please call the main number to the clinic [phone] and follow the prompts.  For any non-urgent questions, you may also contact your provider using MyChart. We now offer e-Visits for anyone 18 and older to request care online for non-urgent symptoms. For details visit [email or web address].   Also download the MyChart app! Go to the app store, search "MyChart", open the app, select [facility], and log in with your MyChart username and password.  Due to Covid, a mask is required upon entering the hospital/clinic. If you do not have a mask, one will be given to you upon arrival. For doctor visits, patients may have 1 support person aged 18 or older with them. For treatment visits, patients cannot have anyone with them due to current Covid guidelines and our immunocompromised population.  

## 2021-05-28 NOTE — Progress Notes (Signed)
Tyler [name], [place] [address]   CLINIC:  Medical Oncology/Hematology  PCP:  Erven Colla, DO [place] [address] [phone]   REASON FOR VISIT: Follow-up for Hodgkin's disease   PRIOR THERAPY: ABVD x 6 cycles from 01/12/2020 to 06/14/2020   CURRENT THERAPY: Surveillance  INTERVAL HISTORY:  Tyler [name] 25 y.o. male returns for routine follow-up of Hodgkin's disease.  He was last seen by Dr. Delton Coombes on 02/07/2021.  At today's visit, he reports feeling well.  No recent hospitalizations, surgeries, or changes in baseline health status.  He continues to have left hand numbness ever since he was on chemotherapy, most often noted when he is driving or keeping his shoulder at an angle.  He denies any nausea, early satiety, or abdominal pain.  No new lumps or bumps.  No fever, chills, night sweats, unintentional weight loss.  He has 100% energy and 100% appetite. He endorses that he is maintaining a stable weight.    REVIEW OF SYSTEMS:  Review of Systems  Constitutional:  Negative for appetite change, chills, diaphoresis, fatigue, fever and unexpected weight change.  HENT:   Negative for lump/mass and nosebleeds.   Eyes:  Negative for eye problems.  Respiratory:  Negative for cough, hemoptysis and shortness of breath.   Cardiovascular:  Negative for chest pain, leg swelling and palpitations.  Gastrointestinal:  Negative for abdominal pain, blood in stool, constipation, diarrhea, nausea and vomiting.  Genitourinary:  Negative for hematuria.   Skin: Negative.   Neurological:  Positive for numbness (Left hand). Negative for dizziness, headaches and light-headedness.  Hematological:  Does not bruise/bleed easily.     PAST MEDICAL/SURGICAL HISTORY:  Past Medical History:  Diagnosis Date   ADD (attention deficit disorder)    Inattention type   Cyclic vomiting syndrome 2009   GERD (gastroesophageal reflux disease)     Hypertension    Past Surgical History:  Procedure Laterality Date   closed reduction rt. wrist     IR IMAGING GUIDED PORT INSERTION  12/31/2019   MASS BIOPSY Left 01/03/2020   Procedure: OPEN NECK BIOPSY;  Surgeon: Leta Baptist, MD;  Location: [facility];  Service: ENT;  Laterality: Left;     SOCIAL HISTORY:  Social History   Socioeconomic History   Marital status: Single    Spouse name: Not on file   Number of children: 0   Years of education: Not on file   Highest education level: Not on file  Occupational History   Not on file  Tobacco Use   Smoking status: Never   Smokeless tobacco: Former  Scientific laboratory technician Use: Never used  Substance and Sexual Activity   Alcohol use: Never   Drug use: Never   Sexual activity: Not Currently  Other Topics Concern   Not on file  Social History Narrative   Not on file   Social Determinants of Health   Financial Resource Strain: Not on file  Food Insecurity: Not on file  Transportation Needs: Not on file  Physical Activity: Not on file  Stress: Not on file  Social Connections: Not on file  Intimate Partner Violence: Not on file    FAMILY HISTORY:  Family History  Problem Relation Age of Onset   Cancer Paternal Grandmother    Hypertension Mother    Hypertension Father     CURRENT MEDICATIONS:  Outpatient Encounter Medications as of 05/29/2021  Medication Sig   amLODipine ([place]) 10  MG tablet Take 1 tablet (10 mg total) by mouth daily.   lidocaine-prilocaine (EMLA) cream Apply small amount over port site and cover with plastic wrap 1 hour before appointment.   omeprazole (PRILOSEC OTC) 20 MG tablet Take 1 tablet (20 mg total) by mouth daily.   ondansetron (ZOFRAN) 8 MG tablet Take 1 tablet (8 mg total) by mouth every 8 (eight) hours as needed for nausea or vomiting.   prochlorperazine (COMPAZINE) 10 MG tablet Take 1 tablet (10 mg total) by mouth every 6 (six) hours as needed (Nausea or vomiting).   sodium  chloride (OCEAN) 0.65 % SOLN nasal spray Place 1 spray into both nostrils as needed for congestion.   Facility-Administered Encounter Medications as of 05/29/2021  Medication   sodium chloride flush (NS) 0.9 % injection 10 mL    ALLERGIES:  Allergies  Allergen Reactions   Ace Inhibitors Swelling     PHYSICAL EXAM:  ECOG PERFORMANCE STATUS: 0 - Asymptomatic  There were no vitals filed for this visit. There were no vitals filed for this visit. Physical Exam Constitutional:      Appearance: Normal appearance. He is obese.  HENT:     Head: Normocephalic and atraumatic.     Mouth/Throat:     Mouth: Mucous membranes are moist.  Eyes:     Extraocular Movements: Extraocular movements intact.     Pupils: Pupils are equal, round, and reactive to light.  Cardiovascular:     Rate and Rhythm: Normal rate and regular rhythm.     Pulses: Normal pulses.     Heart sounds: Normal heart sounds.  Pulmonary:     Effort: Pulmonary effort is normal.     Breath sounds: Normal breath sounds.  Chest:  Breasts:    Right: No axillary adenopathy or supraclavicular adenopathy.     Left: No axillary adenopathy or supraclavicular adenopathy.  Abdominal:     General: Bowel sounds are normal.     Palpations: Abdomen is soft.     Tenderness: There is no abdominal tenderness.  Musculoskeletal:        General: No swelling.     Right lower leg: No edema.     Left lower leg: No edema.  Lymphadenopathy:     Head:     Right side of head: No submental, submandibular, tonsillar, preauricular, posterior auricular or occipital adenopathy.     Left side of head: No submental, submandibular, tonsillar, preauricular, posterior auricular or occipital adenopathy.     Cervical: No cervical adenopathy.     Right cervical: No superficial, deep or posterior cervical adenopathy.    Left cervical: No superficial, deep or posterior cervical adenopathy.     Upper Body:     Right upper body: No supraclavicular or  axillary adenopathy.     Left upper body: No supraclavicular or axillary adenopathy.  Skin:    General: Skin is warm and dry.  Neurological:     General: No focal deficit present.     Mental Status: He is alert and oriented to person, place, and time.  Psychiatric:        Mood and Affect: Mood normal.        Behavior: Behavior normal.     LABORATORY DATA:  I have reviewed the labs as listed.  CBC    Component Value Date/Time   WBC 8.4 05/22/2021 1525   RBC 4.57 05/22/2021 1525   HGB 13.5 05/22/2021 1525   HGB 12.9 (L) 12/24/2019 1556   HCT 40.6 05/22/2021  1525   HCT 38.8 12/24/2019 1556   PLT 280 05/22/2021 1525   PLT 400 12/24/2019 1556   MCV 88.8 05/22/2021 1525   MCV 84 12/24/2019 1556   MCH 29.5 05/22/2021 1525   MCHC 33.3 05/22/2021 1525   RDW 13.0 05/22/2021 1525   RDW 12.9 12/24/2019 1556   LYMPHSABS 3.1 05/22/2021 1525   LYMPHSABS 2.0 12/24/2019 1556   MONOABS 0.6 05/22/2021 1525   EOSABS 0.2 05/22/2021 1525   EOSABS 0.1 12/24/2019 1556   BASOSABS 0.1 05/22/2021 1525   BASOSABS 0.1 12/24/2019 1556   CMP Latest Ref Rng & Units 05/22/2021 02/01/2021 10/13/2020  Glucose 70 - 99 mg/dL 100(H) 104(H) 107(H)  BUN 6 - 20 mg/dL _0 Creatinine 0.61 - 1.24 mg/dL 1.03 0.99 0.93  Sodium 135 - 145 mmol/L 135 140 137  Potassium 3.5 - 5.1 mmol/L 3.7 4.2 4.4  Chloride 98 - 111 mmol/L 102 104 104  CO2 22 - 32 mmol/L _1 Calcium 8.9 - 10.3 mg/dL 8.4(L) 9.3 9.0  Total Protein 6.5 - 8.1 g/dL 7.1 7.1 7.3  Total Bilirubin 0.3 - 1.2 mg/dL 0.4 0.5 0.3  Alkaline Phos 38 - 126 U/L 73 67 65  AST 15 - 41 U/L 14(L) 11(L) 13(L)  ALT 0 - 44 U/L _2 DIAGNOSTIC IMAGING:  I have independently reviewed the relevant imaging and discussed with the patient.  ASSESSMENT & PLAN: 1.  Stage IV AE classical Hodgkin's disease: -PET scan on 01/05/2020 shows hypermetabolic left cervical, supraclavicular, bilateral mediastinal adenopathy.  --Hypermetabolic porta hepatic lymph  nodes.  --Mildly increased FDG uptake associated with spleen which is above liver activity.  Diffuse increase uptake in the axial and proximal appendicular skeleton. -IPI score of 3. -6 cycles of ABVD from 01/12/2020 through 06/14/2020. -PET2 on 03/06/2020 shows left lower neck lymph node decreased in size and metabolic some.  Measures 2 cm, previously 3.7 cm with SUV 1.9, previously 14.5.  1.6 cm left supraclavicular lymph node with SUV 3.0, previously 2.6 cm with SUV 9.8.  Left prevascular lymph node decreased to 1.9 cm from 5.3 cm.  Right prevascular lymph node decreased to 0.8 cm, previously 2.3 cm.  Borderline prominent 0.9 cm right external iliac lymph node with mild hypermetabolism with maximum SUV 3.5 new. -The right external iliac lymph node is likely reactive.  Dr. Delton Coombes recommended continuing 2 more cycles of ABVD and repeat PET scan after cycle 4. -PET 4 on 04/28/2020 shows previously described right external iliac lymph node measures 0.8 cm, previously 0.9 cm with maximum SUV 1.9, previously 3.5.  Significantly reduced size of the left lower neck and mediastinal lymph nodes, [place] 3 activity. -PET scan on 07/10/2020 showed a 13 mm short axis left supraclavicular node SUV 1.9.  Previous SUV 2.2.  10 mm short axis left superior mediastinal node SUV 1.9, previously 2.0.  No other adenopathy was seen.  [place] 3 activity. -XRT was not offered as he did not have bulky disease (more than 10 cm). -CT CAP on 02/05/2021 showed left supraclavicular and superior mediastinal lymph node slightly diminished in size compared to prior exam.  Supraclavicular lymph node measures 1.3 x 0.8 cm previously 2.2 x 1.3 cm.  No other adenopathy.  Mild splenomegaly, maximum coronal span 13.8 cm. - Per Dr. Delton Coombes, mild splenomegaly was seen on pretreatment PET scan and was not conclusive of involvement - He does not have any B symptoms.  No masses or lymphadenopathy palpated on  exam. - Patient inquired  regarding port removal - reports that it causes some mild discomfort and interferes with his hobby of shooting guns.  Informed patient that the preference is to keep port in place for 2 years after completing active treatment, but that it can be removed earlier if it is causing discomfort or negatively impacting quality of life.  Patient verbalizes understanding, and states that he will "try to keep it in a bit longer." - Most recent labs (05/22/2021): CMP unremarkable, CBC normal, LDH normal at 156, ESR normal at 9 - PLAN: RTC in 4 months with office visit, repeat labs, and repeat CT CAP  2.  Peripheral neuropathy: - He has on and off numbness in the left hand fingertips when he raises his arm while driving the car.  No neuropathic pains reported. - Patient reports that this has been ongoing ever since he started chemotherapy   PLAN SUMMARY & DISPOSITION: -CT CAP with contrast in 4 months - Labs in 4 months - MD visit after labs and CT scan  All questions were answered. The patient knows to call the clinic with any problems, questions or concerns.  Medical decision making: Low  Time spent on visit: I spent 20 minutes counseling the patient face to face. The total time spent in the appointment was 30 minutes and more than 50% was on counseling.  The above clinical data and plan of treatment was discussed with Dr. Delton Coombes, supervising physician, who agrees.  Harriett Rush, PA-C  05/29/2021 3:48 PM

## 2021-05-29 ENCOUNTER — Other Ambulatory Visit: Payer: Self-pay

## 2021-05-29 ENCOUNTER — Inpatient Hospital Stay (HOSPITAL_COMMUNITY): Payer: Managed Care, Other (non HMO) | Attending: Physician Assistant | Admitting: Physician Assistant

## 2021-05-29 VITALS — BP 133/78 | HR 88 | Temp 97.6°F | Resp 16 | Wt 271.6 lb

## 2021-05-29 DIAGNOSIS — Z79899 Other long term (current) drug therapy: Secondary | ICD-10-CM | POA: Insufficient documentation

## 2021-05-29 DIAGNOSIS — C8118 Nodular sclerosis classical Hodgkin lymphoma, lymph nodes of multiple sites: Secondary | ICD-10-CM | POA: Diagnosis not present

## 2021-05-29 DIAGNOSIS — C8190 Hodgkin lymphoma, unspecified, unspecified site: Secondary | ICD-10-CM | POA: Insufficient documentation

## 2021-05-29 NOTE — Patient Instructions (Signed)
[place] at [facility] Discharge Instructions  You were seen today by Tarri Abernethy PA-C for your history of Hodgkin's lymphoma.  Your labs and physical exam did not show any concerning abnormalities.  We will check labs and repeat CT scan in 4 months and see you back in the office for a follow-up visit at that time.  As we discussed, it is recommended that your port remain in place for 2 to 3 years after completion of chemotherapy.  However, if it is causing any discomfort or decreased quality of life, we can ask interventional radiology to remove it sooner.  Please let us know if this is something you would like to pursue.  LABS: Return in 4 months for repeat labs  OTHER TESTS: CT chest/abdomen/pelvis in 4 months  MEDICATIONS: No changes to home medications  FOLLOW-UP APPOINTMENT: Office visit in 4 months, after labs and CT scan   Thank you for choosing [place] at [facility] to provide your oncology and hematology care.  To afford each patient quality time with our provider, please arrive at least 15 minutes before your scheduled appointment time.   If you have a lab appointment with the [facility] please come in thru the Main Entrance and check in at the main information desk.  You need to re-schedule your appointment should you arrive 10 or more minutes late.  We strive to give you quality time with our providers, and arriving late affects you and other patients whose appointments are after yours.  Also, if you no show three or more times for appointments you may be dismissed from the clinic at the providers discretion.     Again, thank you for choosing [facility].  Our hope is that these requests will decrease the amount of time that you wait before being seen by our physicians.       _____________________________________________________________  Should you have questions after your visit to [facility], please contact our office at [phone] and follow the prompts.  Our office hours are 8:00 a.m. and 4:30 p.m. Monday - Friday.  Please note that voicemails left after 4:00 p.m. may not be returned until the following business day.  We are closed weekends and major holidays.  You do have access to a nurse 24-7, just call the main number to the clinic [phone] and do not press any options, hold on the line and a nurse will answer the phone.    For prescription refill requests, have your pharmacy contact our office and allow 72 hours.    Due to Covid, you will need to wear a mask upon entering the hospital. If you do not have a mask, a mask will be given to you at the Main Entrance upon arrival. For doctor visits, patients may have 1 support person age 25 or older with them. For treatment visits, patients can not have anyone with them due to social distancing guidelines and our immunocompromised population.

## 2021-08-31 DIAGNOSIS — M79675 Pain in left toe(s): Secondary | ICD-10-CM | POA: Diagnosis not present

## 2021-08-31 DIAGNOSIS — L600 Ingrowing nail: Secondary | ICD-10-CM | POA: Diagnosis not present

## 2021-09-24 ENCOUNTER — Other Ambulatory Visit: Payer: Self-pay | Admitting: Family Medicine

## 2021-09-24 MED ORDER — AMLODIPINE BESYLATE 10 MG PO TABS: 10.0000 mg | ORAL_TABLET | Freq: Every day | ORAL | 0 refills | Status: DC

## 2021-09-24 NOTE — Telephone Encounter (Signed)
Patient has appointment on 12/12 to establish care and medication follow up. He needs enough amlodipine 10 mg until seen. [facility]

## 2021-09-27 ENCOUNTER — Encounter (HOSPITAL_COMMUNITY): Payer: Self-pay | Admitting: Hematology

## 2021-09-27 ENCOUNTER — Other Ambulatory Visit: Payer: Self-pay

## 2021-09-27 ENCOUNTER — Encounter: Payer: Self-pay | Admitting: Hematology and Oncology

## 2021-09-27 ENCOUNTER — Inpatient Hospital Stay (HOSPITAL_COMMUNITY): Payer: BC Managed Care – PPO | Attending: Hematology

## 2021-09-27 DIAGNOSIS — C8118 Nodular sclerosis classical Hodgkin lymphoma, lymph nodes of multiple sites: Secondary | ICD-10-CM | POA: Diagnosis not present

## 2021-09-27 LAB — CBC WITH DIFFERENTIAL/PLATELET
Abs Immature Granulocytes: 0.01 10*3/uL (ref 0.00–0.07)
Basophils Absolute: 0 10*3/uL (ref 0.0–0.1)
Basophils Relative: 1 %
Eosinophils Absolute: 0.1 10*3/uL (ref 0.0–0.5)
Eosinophils Relative: 2 %
HCT: 40.4 % (ref 39.0–52.0)
Hemoglobin: 13.8 g/dL (ref 13.0–17.0)
Immature Granulocytes: 0 %
Lymphocytes Relative: 21 %
Lymphs Abs: 1.3 10*3/uL (ref 0.7–4.0)
MCH: 30.7 pg (ref 26.0–34.0)
MCHC: 34.2 g/dL (ref 30.0–36.0)
MCV: 89.8 fL (ref 80.0–100.0)
Monocytes Absolute: 0.6 10*3/uL (ref 0.1–1.0)
Monocytes Relative: 10 %
Neutro Abs: 4.1 10*3/uL (ref 1.7–7.7)
Neutrophils Relative %: 66 %
Platelets: 246 10*3/uL (ref 150–400)
RBC: 4.5 MIL/uL (ref 4.22–5.81)
RDW: 12.8 % (ref 11.5–15.5)
WBC: 6.1 10*3/uL (ref 4.0–10.5)
nRBC: 0 % (ref 0.0–0.2)

## 2021-09-27 LAB — COMPREHENSIVE METABOLIC PANEL
ALT: 28 U/L (ref 0–44)
AST: 14 U/L — ABNORMAL LOW (ref 15–41)
Albumin: 4.1 g/dL (ref 3.5–5.0)
Alkaline Phosphatase: 75 U/L (ref 38–126)
Anion gap: 10 (ref 5–15)
BUN: 12 mg/dL (ref 6–20)
CO2: 23 mmol/L (ref 22–32)
Calcium: 8.6 mg/dL — ABNORMAL LOW (ref 8.9–10.3)
Chloride: 103 mmol/L (ref 98–111)
Creatinine, Ser: 0.92 mg/dL (ref 0.61–1.24)
GFR, Estimated: >60 mL/min (ref >60)
Glucose, Bld: 116 mg/dL — ABNORMAL HIGH (ref 70–99)
Potassium: 3.7 mmol/L (ref 3.5–5.1)
Sodium: 136 mmol/L (ref 135–145)
Total Bilirubin: 0.4 mg/dL (ref 0.3–1.2)
Total Protein: 7.1 g/dL (ref 6.5–8.1)

## 2021-09-27 LAB — SEDIMENTATION RATE: Sed Rate: 7 mm/hr (ref 0–16)

## 2021-09-27 LAB — LACTATE DEHYDROGENASE: LDH: 154 U/L (ref 98–192)

## 2021-09-27 MED ORDER — HEPARIN SOD (PORK) LOCK FLUSH 100 UNIT/ML IV SOLN
500.0000 [IU] | Freq: Once | INTRAVENOUS | Status: AC
  Administered 2021-09-27: 500 [IU] via INTRAVENOUS

## 2021-09-27 MED ORDER — SODIUM CHLORIDE 0.9% FLUSH
10.0000 mL | INTRAVENOUS | Status: DC | PRN
  Administered 2021-09-27: 10 mL via INTRAVENOUS

## 2021-09-27 NOTE — Progress Notes (Signed)
Patients port flushed without difficulty.  Good blood return noted with no bruising or swelling noted at site.  Band aid applied.  VSS with discharge and left in satisfactory condition with no s/s of distress noted.   

## 2021-09-27 NOTE — Patient Instructions (Signed)
[facility]  Discharge Instructions: Thank you for choosing [facility] to provide your oncology and hematology care.  If you have a lab appointment with the Cancer Center, please come in thru the Main Entrance and check in at the main information desk.  Wear comfortable clothing and clothing appropriate for easy access to any Portacath or PICC line.   We strive to give you quality time with your provider. You may need to reschedule your appointment if you arrive late (15 or more minutes).  Arriving late affects you and other patients whose appointments are after yours.  Also, if you miss three or more appointments without notifying the office, you may be dismissed from the clinic at the provider's discretion.      For prescription refill requests, have your pharmacy contact our office and allow 72 hours for refills to be completed.        To help prevent nausea and vomiting after your treatment, we encourage you to take your nausea medication as directed.  BELOW ARE SYMPTOMS THAT SHOULD BE REPORTED IMMEDIATELY: *FEVER GREATER THAN 100.4 F (38 C) OR HIGHER *CHILLS OR SWEATING *NAUSEA AND VOMITING THAT IS NOT CONTROLLED WITH YOUR NAUSEA MEDICATION *UNUSUAL SHORTNESS OF BREATH *UNUSUAL BRUISING OR BLEEDING *URINARY PROBLEMS (pain or burning when urinating, or frequent urination) *BOWEL PROBLEMS (unusual diarrhea, constipation, pain near the anus) TENDERNESS IN MOUTH AND THROAT WITH OR WITHOUT PRESENCE OF ULCERS (sore throat, sores in mouth, or a toothache) UNUSUAL RASH, SWELLING OR PAIN  UNUSUAL VAGINAL DISCHARGE OR ITCHING   Items with * indicate a potential emergency and should be followed up as soon as possible or go to the Emergency Department if any problems should occur.  Please show the CHEMOTHERAPY ALERT CARD or IMMUNOTHERAPY ALERT CARD at check-in to the Emergency Department and triage nurse.  Should you have questions after your visit or need to cancel  or reschedule your appointment, please contact [facility] [phone]  and follow the prompts.  Office hours are 8:00 a.m. to 4:30 p.m. Monday - Friday. Please note that voicemails left after 4:00 p.m. may not be returned until the following business day.  We are closed weekends and major holidays. You have access to a nurse at all times for urgent questions. Please call the main number to the clinic [phone] and follow the prompts.  For any non-urgent questions, you may also contact your provider using MyChart. We now offer e-Visits for anyone 18 and older to request care online for non-urgent symptoms. For details visit [email or web address].   Also download the MyChart app! Go to the app store, search "MyChart", open the app, select [facility], and log in with your MyChart username and password.  Due to Covid, a mask is required upon entering the hospital/clinic. If you do not have a mask, one will be given to you upon arrival. For doctor visits, patients may have 1 support person aged 18 or older with them. For treatment visits, patients cannot have anyone with them due to current Covid guidelines and our immunocompromised population.  

## 2021-09-28 ENCOUNTER — Ambulatory Visit (HOSPITAL_COMMUNITY)
Admission: RE | Admit: 2021-09-28 | Discharge: 2021-09-28 | Disposition: A | Discharge status: Home / Self Care | Payer: BC Managed Care – PPO | Source: Ambulatory Visit | Attending: Physician Assistant | Admitting: Physician Assistant

## 2021-09-28 DIAGNOSIS — C8118 Nodular sclerosis classical Hodgkin lymphoma, lymph nodes of multiple sites: Secondary | ICD-10-CM | POA: Diagnosis not present

## 2021-09-28 DIAGNOSIS — R161 Splenomegaly, not elsewhere classified: Secondary | ICD-10-CM | POA: Diagnosis not present

## 2021-09-28 DIAGNOSIS — C8190 Hodgkin lymphoma, unspecified, unspecified site: Secondary | ICD-10-CM | POA: Diagnosis not present

## 2021-09-28 DIAGNOSIS — K449 Diaphragmatic hernia without obstruction or gangrene: Secondary | ICD-10-CM | POA: Diagnosis not present

## 2021-09-28 DIAGNOSIS — R590 Localized enlarged lymph nodes: Secondary | ICD-10-CM | POA: Diagnosis not present

## 2021-09-28 MED ORDER — IOHEXOL 300 MG/ML  SOLN
100.0000 mL | Freq: Once | INTRAMUSCULAR | Status: AC | PRN
  Administered 2021-09-28: 100 mL via INTRAVENOUS

## 2021-10-03 NOTE — Progress Notes (Signed)
[place], [place] [address]   CLINIC:  Medical Oncology/Hematology  PCP:  Coral Spikes, DO [address] [place] [address] [phone]   REASON FOR VISIT:  Follow-up for Hodgkin's disease  PRIOR THERAPY: ABVD x 6 cycles from 01/12/2020 to 06/14/2020  CURRENT THERAPY: Surveillance  BRIEF ONCOLOGIC HISTORY:  Oncology History  Hodgkin's disease ([place])  01/06/2020 Initial Diagnosis   Hodgkin's disease ([place])   01/12/2020 -  Chemotherapy   The patient had DOXOrubicin (ADRIAMYCIN) chemo injection 62 mg, 25 mg/m2 = 62 mg, Intravenous,  Once, 6 of 6 cycles Administration: 62 mg (01/12/2020), 62 mg (01/26/2020), 62 mg (02/09/2020), 62 mg (02/23/2020), 62 mg (03/08/2020), 62 mg (03/22/2020), 62 mg (04/05/2020), 62 mg (04/19/2020), 62 mg (05/03/2020), 62 mg (05/17/2020), 62 mg (05/31/2020), 62 mg (06/14/2020) palonosetron (ALOXI) injection 0.25 mg, 0.25 mg, Intravenous,  Once, 6 of 6 cycles Administration: 0.25 mg (01/12/2020), 0.25 mg (01/26/2020), 0.25 mg (02/09/2020), 0.25 mg (02/23/2020), 0.25 mg (03/08/2020), 0.25 mg (03/22/2020), 0.25 mg (04/05/2020), 0.25 mg (04/19/2020), 0.25 mg (05/03/2020), 0.25 mg (05/17/2020), 0.25 mg (05/31/2020), 0.25 mg (06/14/2020) bleomycin (BLEOCIN) 25 Units in sodium chloride 0.9 % 50 mL chemo infusion, 10 Units/m2 = 25 Units, Intravenous,  Once, 6 of 6 cycles Administration: 25 Units (01/12/2020), 25 Units (01/26/2020), 25 Units (02/09/2020), 25 Units (02/23/2020), 25 Units (03/08/2020), 25 Units (03/22/2020), 25 Units (04/05/2020), 25 Units (04/19/2020), 25 Units (05/03/2020), 25 Units (05/17/2020), 25 Units (05/31/2020), 25 Units (06/14/2020) dacarbazine (DTIC) 920 mg in sodium chloride 0.9 % 250 mL chemo infusion, 375 mg/m2 = 920 mg, Intravenous,  Once, 6 of 6 cycles Administration: 920 mg (01/12/2020), 920 mg (01/26/2020), 920 mg (02/09/2020), 920 mg (02/23/2020), 920 mg (03/08/2020), 920 mg (03/22/2020), 920 mg (04/05/2020), 920 mg (04/19/2020), 920 mg  (05/03/2020), 920 mg (05/17/2020), 920 mg (05/31/2020), 920 mg (06/14/2020) fosaprepitant (EMEND) 150 mg in sodium chloride 0.9 % 145 mL IVPB, 150 mg, Intravenous,  Once, 6 of 6 cycles Administration: 150 mg (01/12/2020), 150 mg (01/26/2020), 150 mg (02/09/2020), 150 mg (02/23/2020), 150 mg (03/08/2020), 150 mg (03/22/2020), 150 mg (04/05/2020), 150 mg (04/19/2020), 150 mg (05/03/2020), 150 mg (05/17/2020), 150 mg (05/31/2020), 150 mg (06/14/2020) vinBLAStine (VELBAN) 15 mg in sodium chloride 0.9 % 50 mL chemo infusion, 6.1 mg/m2 = 14.8 mg, Intravenous, Once, 6 of 6 cycles Administration: 15 mg (01/12/2020), 15 mg (01/26/2020), 15 mg (02/09/2020), 15 mg (02/23/2020), 15 mg (03/08/2020), 15 mg (03/22/2020), 15 mg (04/05/2020), 15 mg (04/19/2020), 15 mg (05/03/2020), 15 mg (05/17/2020), 15 mg (05/31/2020), 15 mg (06/14/2020)   for chemotherapy treatment.       CANCER STAGING:  Cancer Staging  Hodgkin's disease [facility]) Staging form: Hodgkin and Non-Hodgkin Lymphoma, AJCC 8th Edition - Clinical stage from 01/06/2020: Stage IV (Hodgkin lymphoma, A - Asymptomatic) - Unsigned   INTERVAL HISTORY:  [name], a 25 y.o. male, returns for routine follow-up of his Hodgkin's disease. [name] was last seen on 05/29/2021 by Tarri Abernethy, PA-C.   Today he reports feeling good. He reports numbness in his left third and fourth fingers which does not interfere with his ability to pick up or hold objects.   REVIEW OF SYSTEMS:  Review of Systems  Constitutional:  Negative for appetite change and fatigue.  Neurological:  Positive for numbness (L hand).  All other systems reviewed and are negative.  PAST MEDICAL/SURGICAL HISTORY:  Past Medical History:  Diagnosis Date   ADD (attention deficit disorder)    Inattention  type   Cyclic vomiting syndrome 2009   GERD (gastroesophageal reflux disease)    Hypertension    Past Surgical History:  Procedure Laterality Date   closed reduction rt. wrist     IR IMAGING GUIDED PORT  INSERTION  12/31/2019   MASS BIOPSY Left 01/03/2020   Procedure: OPEN NECK BIOPSY;  Surgeon: Leta Baptist, MD;  Location: [facility];  Service: ENT;  Laterality: Left;    SOCIAL HISTORY:  Social History   Socioeconomic History   Marital status: Single    Spouse name: Not on file   Number of children: 0   Years of education: Not on file   Highest education level: Not on file  Occupational History   Not on file  Tobacco Use   Smoking status: Never   Smokeless tobacco: Former  Scientific laboratory technician Use: Never used  Substance and Sexual Activity   Alcohol use: Never   Drug use: Never   Sexual activity: Not Currently  Other Topics Concern   Not on file  Social History Narrative   Not on file   Social Determinants of Health   Financial Resource Strain: Not on file  Food Insecurity: Not on file  Transportation Needs: Not on file  Physical Activity: Not on file  Stress: Not on file  Social Connections: Not on file  Intimate Partner Violence: Not on file    FAMILY HISTORY:  Family History  Problem Relation Age of Onset   Cancer Paternal Grandmother    Hypertension Mother    Hypertension Father     CURRENT MEDICATIONS:  Current Outpatient Medications  Medication Sig Dispense Refill   amLODipine (NORVASC) 10 MG tablet Take 1 tablet (10 mg total) by mouth daily. 30 tablet 0   omeprazole (PRILOSEC OTC) 20 MG tablet Take 1 tablet (20 mg total) by mouth daily. 90 tablet 1   sodium chloride (OCEAN) 0.65 % SOLN nasal spray Place 1 spray into both nostrils as needed for congestion.     No current facility-administered medications for this visit.   Facility-Administered Medications Ordered in Other Visits  Medication Dose Route Frequency Provider Last Rate Last Admin   sodium chloride flush (NS) 0.9 % injection 10 mL  10 mL Intravenous PRN Derek Jack, MD   10 mL at 05/19/20 1033    ALLERGIES:  Allergies  Allergen Reactions   Ace Inhibitors Swelling     PHYSICAL EXAM:  Performance status (ECOG): 1 - Symptomatic but completely ambulatory  Vitals:   10/04/21 1542  BP: (!) 150/97  Pulse: 75  Resp: 18  Temp: (!) 97.5 F (36.4 C)  SpO2: 96%   Wt Readings from Last 3 Encounters:  10/04/21 276 lb 0.3 oz (125.2 kg)  09/27/21 277 lb 3.2 oz (125.7 kg)  05/29/21 271 lb 9.7 oz (123.2 kg)   Physical Exam Vitals reviewed.  Constitutional:      Appearance: Normal appearance. He is obese.  Cardiovascular:     Rate and Rhythm: Normal rate and regular rhythm.     Pulses: Normal pulses.     Heart sounds: Normal heart sounds.  Pulmonary:     Effort: Pulmonary effort is normal.     Breath sounds: Normal breath sounds.  Abdominal:     Palpations: Abdomen is soft. There is no hepatomegaly, splenomegaly or mass.     Tenderness: There is no abdominal tenderness.  Musculoskeletal:     Right lower leg: No edema.     Left lower leg:  No edema.  Lymphadenopathy:     Cervical: No cervical adenopathy.     Right cervical: No superficial cervical adenopathy.    Left cervical: No superficial cervical adenopathy.     Upper Body:     Right upper body: No supraclavicular, axillary or pectoral adenopathy.     Left upper body: No supraclavicular, axillary or pectoral adenopathy.     Lower Body: No right inguinal adenopathy. No left inguinal adenopathy.  Neurological:     General: No focal deficit present.     Mental Status: He is alert and oriented to person, place, and time.  Psychiatric:        Mood and Affect: Mood normal.        Behavior: Behavior normal.     LABORATORY DATA:  I have reviewed the labs as listed.  CBC Latest Ref Rng & Units 09/27/2021 05/22/2021 02/01/2021  WBC 4.0 - 10.5 K/uL 6.1 8.4 6.1  Hemoglobin 13.0 - 17.0 g/dL 13.8 13.5 14.1  Hematocrit 39.0 - 52.0 % 40.4 40.6 43.4  Platelets 150 - 400 K/uL 246 280 277   CMP Latest Ref Rng & Units 09/27/2021 05/22/2021 02/01/2021  Glucose 70 - 99 mg/dL 116(H) 100(H) 104(H)  BUN 6 - 20  mg/dL 12 15 16   Creatinine 0.61 - 1.24 mg/dL 0.92 1.03 0.99  Sodium 135 - 145 mmol/L 136 135 140  Potassium 3.5 - 5.1 mmol/L 3.7 3.7 4.2  Chloride 98 - 111 mmol/L 103 102 104  CO2 22 - 32 mmol/L 23 26 27   Calcium 8.9 - 10.3 mg/dL 8.6(L) 8.4(L) 9.3  Total Protein 6.5 - 8.1 g/dL 7.1 7.1 7.1  Total Bilirubin 0.3 - 1.2 mg/dL 0.4 0.4 0.5  Alkaline Phos 38 - 126 U/L 75 73 67  AST 15 - 41 U/L 14(L) 14(L) 11(L)  ALT 0 - 44 U/L 28 23 27     DIAGNOSTIC IMAGING:  I have independently reviewed the scans and discussed with the patient. CT CHEST ABDOMEN PELVIS W CONTRAST  Result Date: 09/29/2021 CLINICAL DATA:  Follow-up Hodgkin's lymphoma. EXAM: CT CHEST, ABDOMEN, AND PELVIS WITH CONTRAST TECHNIQUE: Multidetector CT imaging of the chest, abdomen and pelvis was performed following the standard protocol during bolus administration of intravenous contrast. CONTRAST:  134m OMNIPAQUE IOHEXOL 300 MG/ML  SOLN COMPARISON:  Multiple priors including most recent CT February 05, 2021 FINDINGS: CT CHEST FINDINGS Cardiovascular: Right chest Port-A-Cath with tip in the right atrium. Normal caliber aorta and central pulmonary arteries. Normal size heart. No significant pericardial effusion/thickening. Mediastinum/Nodes: Continued decrease in size of the index and non index supraclavicular and superior mediastinal lymph nodes with the indexed supraclavicular lymph node now measuring 6 x 4 mm on image 3/3 previously 13 x 8 mm. No new or enlarging mediastinal, hilar or axillary lymph nodes. Thymic remnant in the anterior mediastinum appears unchanged. No discrete thyroid nodule. Small hiatal hernia. Lungs/Pleura: No suspicious pulmonary nodules or masses. No pleural effusion. No pneumothorax. Musculoskeletal: No chest wall mass or suspicious bone lesions identified. CT ABDOMEN PELVIS FINDINGS Hepatobiliary: No suspicious hepatic lesion. Gallbladder is unremarkable. No biliary ductal dilation. Pancreas: No pancreatic ductal dilation  or evidence of acute inflammation. Spleen: Similar mild splenomegaly measuring 14.1 cm in maximum coronal span. Adrenals/Urinary Tract: Adrenal glands are unremarkable. Kidneys are normal, without renal calculi, solid enhancing lesion, or hydronephrosis. Bladder is unremarkable. Stomach/Bowel: Stomach is within normal limits. Appendix appears normal. No evidence of bowel wall thickening, distention, or inflammatory changes. Vascular/Lymphatic: No significant vascular findings are present. No  pathologically enlarged abdominal or pelvic lymph nodes. Reproductive: Prostate is unremarkable. Other: No abdominal wall hernia or abnormality. No abdominopelvic ascites. Musculoskeletal: No acute or significant osseous findings. IMPRESSION: 1. Continued decrease in size of the supraclavicular and superior mediastinal lymph nodes. 2. No new lymphadenopathy in the chest, abdomen, or pelvis. 3. Similar mild splenomegaly. Electronically Signed   By: Dahlia Bailiff M.D.   On: 09/29/2021 20:51     ASSESSMENT:  1.  Stage IV AE classical Hodgkin's disease: -PET scan on 01/05/2020 shows hypermetabolic left cervical, supraclavicular, bilateral mediastinal adenopathy.  --Hypermetabolic porta hepatic lymph nodes.  --Mildly increased FDG uptake associated with spleen which is above liver activity.  Diffuse increase uptake in the axial and proximal appendicular skeleton. -IPI score of 3. -6 cycles of ABVD from 01/12/2020 through 06/14/2020. -PET2 on 03/06/2010 shows left lower neck lymph node decreased in size and metabolic some.  Measures 2 cm, previously 3.7 cm with SUV 1.9, previously 14.5.  1.6 cm left supraclavicular lymph node with SUV 3.0, previously 2.6 cm with SUV 9.8.  Left prevascular lymph node decreased to 1.9 cm from 5.3 cm.  Right prevascular lymph node decreased to 0.8 cm, previously 2.3 cm.  Borderline prominent 0.9 cm right external iliac lymph node with mild hypermetabolism with maximum SUV 3.5 new. -The right  external iliac lymph node is likely reactive.  I have recommended continuing 2 more cycles of ABVD and repeat PET scan after cycle 4. -PET 4 on 04/28/2020 shows previously described right external iliac lymph node measures 0.8 cm, previously 0.9 cm with maximum SUV 1.9, previously 3.5.  Significantly reduced size of the left lower neck and mediastinal lymph nodes, Deauville 3 activity. -PET scan on 07/10/2020 showed a 13 mm short axis left supraclavicular node SUV 1.9.  Previous SUV 2.2.  10 mm short axis left superior mediastinal node SUV 1.9, previously 2.0.  No other adenopathy was seen.  Deauville 3 activity. -XRT was not offered as he did not have bulky disease (more than 10 cm). -CT CAP on 02/05/2021 showed left supraclavicular and superior mediastinal lymph node slightly diminished in size compared to prior exam.  Supraclavicular lymph node measures 1.3 x 0.8 cm previously 2.2 x 1.3 cm.  No other adenopathy.  Mild splenomegaly, maximum coronal span 13.8 cm.   PLAN:  1.  Stage IV classical Hodgkin's disease: - He does not have any fevers or drenching night sweats which were his presenting symptoms. - Physical examination was benign. - Reviewed labs from 09/27/2021 which showed normal CBC, LDH and LFTs.  ESR was normal. - Reviewed CT CAP from 09/28/2021 which showed continued decrease in size of the supraclavicular and superior mediastinal lymph nodes.  No new lymphadenopathy in the chest, abdomen or pelvis.  Similar mild splenomegaly. - RTC 4 months with repeat labs including LDH and ESR.   2.  Peripheral neuropathy: - He has on and off numbness in the left hand third and fourth finger.  Does not require any intervention.   Orders placed this encounter:  No orders of the defined types were placed in this encounter.    Derek Jack, MD [place] [phone]   I, Thana Ates, am acting as a scribe for Dr. Derek Jack.  I, Derek Jack MD, have  reviewed the above documentation for accuracy and completeness, and I agree with the above.

## 2021-10-04 ENCOUNTER — Other Ambulatory Visit: Payer: Self-pay

## 2021-10-04 ENCOUNTER — Inpatient Hospital Stay (HOSPITAL_COMMUNITY): Payer: BC Managed Care – PPO | Admitting: Hematology

## 2021-10-04 ENCOUNTER — Encounter (HOSPITAL_COMMUNITY): Payer: Self-pay | Admitting: Hematology

## 2021-10-04 ENCOUNTER — Ambulatory Visit: Payer: Managed Care, Other (non HMO) | Admitting: Family Medicine

## 2021-10-04 VITALS — BP 150/97 | HR 75 | Temp 97.5°F | Resp 18 | Ht 74.02 in | Wt 276.0 lb

## 2021-10-04 DIAGNOSIS — C8118 Nodular sclerosis classical Hodgkin lymphoma, lymph nodes of multiple sites: Secondary | ICD-10-CM

## 2021-10-04 NOTE — Patient Instructions (Signed)
[place] at [facility] Discharge Instructions  You were seen and examined today by Dr. Delton Coombes. He reviewed your most recent labs and scan and everything looks good. Please keep follow up as scheduled in 4 months.   Thank you for choosing [place] at [facility] to provide your oncology and hematology care.  To afford each patient quality time with our provider, please arrive at least 15 minutes before your scheduled appointment time.   If you have a lab appointment with the [facility] [place] please come in thru the Main Entrance and check in at the main information desk.  You need to re-schedule your appointment should you arrive 10 or more minutes late.  We strive to give you quality time with our providers, and arriving late affects you and other patients whose appointments are after yours.  Also, if you no show three or more times for appointments you may be dismissed from the clinic at the providers discretion.     Again, thank you for choosing [facility].  Our hope is that these requests will decrease the amount of time that you wait before being seen by our physicians.       _____________________________________________________________  Should you have questions after your visit to [facility], please contact our office at [phone] and follow the prompts.  Our office hours are 8:00 a.m. and 4:30 p.m. Monday - Friday.  Please note that voicemails left after 4:00 p.m. may not be returned until the following business day.  We are closed weekends and major holidays.  You do have access to a nurse 24-7, just call the main number to the clinic [phone] and do not press any options, hold on the line and a nurse will answer the phone.    For prescription refill requests, have your pharmacy contact our office and allow 72 hours.    Due to Covid, you will need to wear a mask upon entering the hospital. If you do not  have a mask, a mask will be given to you at the Main Entrance upon arrival. For doctor visits, patients may have 1 support person age 12 or older with them. For treatment visits, patients can not have anyone with them due to social distancing guidelines and our immunocompromised population.

## 2021-10-08 ENCOUNTER — Encounter (HOSPITAL_COMMUNITY): Payer: Self-pay | Admitting: Hematology

## 2021-10-08 ENCOUNTER — Ambulatory Visit: Payer: Self-pay | Admitting: Family Medicine

## 2021-10-08 ENCOUNTER — Encounter: Payer: Self-pay | Admitting: Family Medicine

## 2021-10-08 ENCOUNTER — Other Ambulatory Visit: Payer: Self-pay

## 2021-10-08 ENCOUNTER — Encounter: Payer: Self-pay | Admitting: Hematology and Oncology

## 2021-10-08 VITALS — BP 136/74 | HR 106 | Temp 97.9°F | Ht 74.0 in | Wt 276.8 lb

## 2021-10-08 DIAGNOSIS — I10 Essential (primary) hypertension: Secondary | ICD-10-CM | POA: Diagnosis not present

## 2021-10-08 DIAGNOSIS — E782 Mixed hyperlipidemia: Secondary | ICD-10-CM | POA: Diagnosis not present

## 2021-10-08 DIAGNOSIS — R739 Hyperglycemia, unspecified: Secondary | ICD-10-CM

## 2021-10-08 MED ORDER — AMLODIPINE BESYLATE 10 MG PO TABS: 10.0000 mg | ORAL_TABLET | Freq: Every day | ORAL | 3 refills | Status: DC

## 2021-10-08 NOTE — Patient Instructions (Signed)
Labs when you've fasted.  I have refilled your meds.  Work on [employer] and exercise.  Follow up in 6 months.

## 2021-10-09 NOTE — Assessment & Plan Note (Signed)
Unsure of control.  Labs ordered.  Advised healthy well-balanced diet and exercise.

## 2021-10-09 NOTE — Assessment & Plan Note (Signed)
Stable.  Continue amlodipine.  Refilled today.

## 2021-10-09 NOTE — Progress Notes (Signed)
Subjective:  Patient ID: Tyler [name], male    DOB: 1996/03/24  Age: 25 y.o. MRN: [id number]  CC: Chief Complaint  Patient presents with   Establish Care   Hypertension    HPI:  25 year old male with obesity, hyperlipidemia, hypertension, and Hodgkin's lymphoma presents to establish care and for follow-up.  Patient's blood pressure is stable.  He is compliant with amlodipine.  Needs refill.  Regarding his Hodgkin's disease, he is not currently on chemotherapy.  He is following closely with hematology/oncology.  He is feeling well currently.  No reported lymphadenopathy.  No fever.  Eating well.  Review of the patient's prior blood work reveals hyperlipidemia.  He is not on pharmacotherapy.  He is trying to eat healthy.  He is planning to return to the gym soon.  Patient Active Problem List   Diagnosis Date Noted   Mixed hyperlipidemia 10/08/2021   Hodgkin's disease ([place]) 01/06/2020   Essential hypertension 08/29/2015    Social Hx   Social History   Socioeconomic History   Marital status: Single    Spouse name: Not on file   Number of children: 0   Years of education: Not on file   Highest education level: Not on file  Occupational History   Not on file  Tobacco Use   Smoking status: Never   Smokeless tobacco: Former  Scientific laboratory technician Use: Never used  Substance and Sexual Activity   Alcohol use: Never   Drug use: Never   Sexual activity: Not Currently  Other Topics Concern   Not on file  Social History Narrative   Not on file   Social Determinants of Health   Financial Resource Strain: Not on file  Food Insecurity: Not on file  Transportation Needs: Not on file  Physical Activity: Not on file  Stress: Not on file  Social Connections: Not on file    Review of Systems  Constitutional: Negative.   Respiratory: Negative.    Hematological: Negative.     Objective:  BP 136/74   Pulse (!) 106   Temp 97.9 F (36.6 C)   Ht 6\' 2"  (1.88 m)   Wt 276  lb 12.8 oz (125.6 kg)   SpO2 99%   BMI 35.54 kg/m   BP/Weight 10/08/2021 10/04/2021 14/01/8184  Systolic BP 631 497 026  Diastolic BP 74 97 378  Wt. (Lbs) 276.8 276.02 277.2  BMI 35.54 35.42 34.65    Physical Exam Constitutional:      Appearance: Normal appearance. He is obese.  HENT:     Head: Normocephalic and atraumatic.  Eyes:     General:        Right eye: No discharge.        Left eye: No discharge.     Conjunctiva/sclera: Conjunctivae normal.  Cardiovascular:     Rate and Rhythm: Normal rate and regular rhythm.     Heart sounds: No murmur heard. Pulmonary:     Effort: Pulmonary effort is normal.     Breath sounds: Normal breath sounds. No wheezing, rhonchi or rales.  Neurological:     Mental Status: He is alert.  Psychiatric:        Mood and Affect: Mood normal.        Behavior: Behavior normal.    Lab Results  Component Value Date   WBC 6.1 09/27/2021   HGB 13.8 09/27/2021   HCT 40.4 09/27/2021   PLT 246 09/27/2021   GLUCOSE 116 (H) 09/27/2021   CHOL  191 12/24/2019   TRIG 76 12/24/2019   HDL 36 (L) 12/24/2019   LDLCALC 141 (H) 12/24/2019   ALT 28 09/27/2021   AST 14 (L) 09/27/2021   NA 136 09/27/2021   K 3.7 09/27/2021   CL 103 09/27/2021   CREATININE 0.92 09/27/2021   BUN 12 09/27/2021   CO2 23 09/27/2021     Assessment & Plan:   Problem List Items Addressed This Visit       Cardiovascular and Mediastinum   Essential hypertension - Primary    Stable.  Continue amlodipine.  Refilled today.      Relevant Medications   amLODipine (NORVASC) 10 MG tablet     Other   Mixed hyperlipidemia    Unsure of control.  Labs ordered.  Advised healthy well-balanced diet and exercise.      Relevant Medications   amLODipine (NORVASC) 10 MG tablet   Other Relevant Orders   Lipid panel   Other Visit Diagnoses     Hyperglycemia       Relevant Orders   Hemoglobin A1c       Meds ordered this encounter  Medications   amLODipine (NORVASC) 10 MG  tablet    Sig: Take 1 tablet (10 mg total) by mouth daily.    Dispense:  90 tablet    Refill:  3    Follow-up:  Return in about 6 months (around 04/08/2022).  [place]

## 2021-10-18 ENCOUNTER — Encounter (HOSPITAL_COMMUNITY): Payer: Self-pay | Admitting: Hematology

## 2021-10-18 ENCOUNTER — Encounter: Payer: Self-pay | Admitting: Hematology and Oncology

## 2021-10-19 DIAGNOSIS — R739 Hyperglycemia, unspecified: Secondary | ICD-10-CM | POA: Diagnosis not present

## 2021-10-19 DIAGNOSIS — E782 Mixed hyperlipidemia: Secondary | ICD-10-CM | POA: Diagnosis not present

## 2021-10-20 LAB — LIPID PANEL
Chol/HDL Ratio: 7.3 ratio — ABNORMAL HIGH (ref 0.0–5.0)
Cholesterol, Total: 203 mg/dL — ABNORMAL HIGH (ref 100–199)
HDL: 28 mg/dL — ABNORMAL LOW (ref >39)
LDL Chol Calc (NIH): 131 mg/dL — ABNORMAL HIGH (ref 0–99)
Triglycerides: 247 mg/dL — ABNORMAL HIGH (ref 0–149)
VLDL Cholesterol Cal: 44 mg/dL — ABNORMAL HIGH (ref 5–40)

## 2021-10-20 LAB — HEMOGLOBIN A1C
Est. average glucose Bld gHb Est-mCnc: 123 mg/dL
Hgb A1c MFr Bld: 5.9 % — ABNORMAL HIGH (ref 4.8–5.6)

## 2021-10-22 ENCOUNTER — Encounter (HOSPITAL_COMMUNITY): Payer: Self-pay | Admitting: Hematology

## 2021-10-22 ENCOUNTER — Encounter: Payer: Self-pay | Admitting: Hematology and Oncology

## 2021-12-11 ENCOUNTER — Encounter (HOSPITAL_COMMUNITY): Payer: Self-pay | Admitting: Hematology

## 2021-12-11 ENCOUNTER — Encounter: Payer: Self-pay | Admitting: Hematology and Oncology

## 2021-12-17 ENCOUNTER — Encounter: Payer: Self-pay | Admitting: Hematology and Oncology

## 2021-12-17 ENCOUNTER — Encounter (HOSPITAL_COMMUNITY): Payer: Self-pay | Admitting: Hematology

## 2021-12-24 ENCOUNTER — Encounter: Payer: Self-pay | Admitting: Hematology and Oncology

## 2021-12-24 ENCOUNTER — Encounter (HOSPITAL_COMMUNITY): Payer: Self-pay | Admitting: Hematology

## 2022-01-28 ENCOUNTER — Inpatient Hospital Stay (HOSPITAL_COMMUNITY): Payer: BC Managed Care – PPO | Attending: Hematology

## 2022-01-28 DIAGNOSIS — C8190 Hodgkin lymphoma, unspecified, unspecified site: Secondary | ICD-10-CM | POA: Insufficient documentation

## 2022-01-28 DIAGNOSIS — C8118 Nodular sclerosis classical Hodgkin lymphoma, lymph nodes of multiple sites: Secondary | ICD-10-CM

## 2022-01-28 DIAGNOSIS — Z79899 Other long term (current) drug therapy: Secondary | ICD-10-CM | POA: Diagnosis not present

## 2022-01-28 LAB — COMPREHENSIVE METABOLIC PANEL
ALT: 27 U/L (ref 0–44)
AST: 13 U/L — ABNORMAL LOW (ref 15–41)
Albumin: 4.3 g/dL (ref 3.5–5.0)
Alkaline Phosphatase: 73 U/L (ref 38–126)
Anion gap: 7 (ref 5–15)
BUN: 16 mg/dL (ref 6–20)
CO2: 26 mmol/L (ref 22–32)
Calcium: 9 mg/dL (ref 8.9–10.3)
Chloride: 106 mmol/L (ref 98–111)
Creatinine, Ser: 0.93 mg/dL (ref 0.61–1.24)
GFR, Estimated: >60 mL/min (ref >60)
Glucose, Bld: 98 mg/dL (ref 70–99)
Potassium: 3.9 mmol/L (ref 3.5–5.1)
Sodium: 139 mmol/L (ref 135–145)
Total Bilirubin: 0.3 mg/dL (ref 0.3–1.2)
Total Protein: 7.6 g/dL (ref 6.5–8.1)

## 2022-01-28 LAB — CBC WITH DIFFERENTIAL/PLATELET
Abs Immature Granulocytes: 0.02 10*3/uL (ref 0.00–0.07)
Basophils Absolute: 0 10*3/uL (ref 0.0–0.1)
Basophils Relative: 1 %
Eosinophils Absolute: 0.1 10*3/uL (ref 0.0–0.5)
Eosinophils Relative: 2 %
HCT: 41 % (ref 39.0–52.0)
Hemoglobin: 13.8 g/dL (ref 13.0–17.0)
Immature Granulocytes: 0 %
Lymphocytes Relative: 31 %
Lymphs Abs: 2.3 10*3/uL (ref 0.7–4.0)
MCH: 29.2 pg (ref 26.0–34.0)
MCHC: 33.7 g/dL (ref 30.0–36.0)
MCV: 86.7 fL (ref 80.0–100.0)
Monocytes Absolute: 0.7 10*3/uL (ref 0.1–1.0)
Monocytes Relative: 10 %
Neutro Abs: 4.2 10*3/uL (ref 1.7–7.7)
Neutrophils Relative %: 56 %
Platelets: 277 10*3/uL (ref 150–400)
RBC: 4.73 MIL/uL (ref 4.22–5.81)
RDW: 13 % (ref 11.5–15.5)
WBC: 7.4 10*3/uL (ref 4.0–10.5)
nRBC: 0 % (ref 0.0–0.2)

## 2022-01-28 LAB — SEDIMENTATION RATE: Sed Rate: 4 mm/hr (ref 0–16)

## 2022-01-28 LAB — LACTATE DEHYDROGENASE: LDH: 155 U/L (ref 98–192)

## 2022-01-28 MED ORDER — SODIUM CHLORIDE 0.9% FLUSH
10.0000 mL | INTRAVENOUS | Status: DC | PRN
  Administered 2022-01-28: 10 mL via INTRAVENOUS

## 2022-01-28 MED ORDER — HEPARIN SOD (PORK) LOCK FLUSH 100 UNIT/ML IV SOLN
500.0000 [IU] | Freq: Once | INTRAVENOUS | Status: AC
  Administered 2022-01-28: 500 [IU] via INTRAVENOUS

## 2022-01-28 NOTE — Progress Notes (Signed)
Tyler [name] presented for Portacath access and flush with labs. Portacath located right chest wall accessed with  H 20 needle.  Good blood return present. Portacath flushed with 60m NS and 500U/560mHeparin and needle removed intact. No bruising or swelling noted at the site.  Procedure tolerated well and without incident.    ? ?Discharged from clinic ambulatory in stable condition. Alert and oriented x 3. [facility]   ?

## 2022-01-28 NOTE — Patient Instructions (Signed)
[name]  Discharge Instructions: Thank you for choosing [facility] to provide your oncology and hematology care.  If you have a lab appointment with the Cancer Center, please come in thru the Main Entrance and check in at the main information desk.  Wear comfortable clothing and clothing appropriate for easy access to any Portacath or PICC line.   We strive to give you quality time with your provider. You may need to reschedule your appointment if you arrive late (15 or more minutes).  Arriving late affects you and other patients whose appointments are after yours.  Also, if you miss three or more appointments without notifying the office, you may be dismissed from the clinic at the provider's discretion.      For prescription refill requests, have your pharmacy contact our office and allow 72 hours for refills to be completed.    Today you received port flush with labs.      BELOW ARE SYMPTOMS THAT SHOULD BE REPORTED IMMEDIATELY: *FEVER GREATER THAN 100.4 F (38 C) OR HIGHER *CHILLS OR SWEATING *NAUSEA AND VOMITING THAT IS NOT CONTROLLED WITH YOUR NAUSEA MEDICATION *UNUSUAL SHORTNESS OF BREATH *UNUSUAL BRUISING OR BLEEDING *URINARY PROBLEMS (pain or burning when urinating, or frequent urination) *BOWEL PROBLEMS (unusual diarrhea, constipation, pain near the anus) TENDERNESS IN MOUTH AND THROAT WITH OR WITHOUT PRESENCE OF ULCERS (sore throat, sores in mouth, or a toothache) UNUSUAL RASH, SWELLING OR PAIN  UNUSUAL VAGINAL DISCHARGE OR ITCHING   Items with * indicate a potential emergency and should be followed up as soon as possible or go to the Emergency Department if any problems should occur.  Please show the CHEMOTHERAPY ALERT CARD or IMMUNOTHERAPY ALERT CARD at check-in to the Emergency Department and triage nurse.  Should you have questions after your visit or need to cancel or reschedule your appointment, please contact  CANCER CENTER  [phone]  and follow the prompts.  Office hours are 8:00 a.m. to 4:30 p.m. Monday - Friday. Please note that voicemails left after 4:00 p.m. may not be returned until the following business day.  We are closed weekends and major holidays. You have access to a nurse at all times for urgent questions. Please call the main number to the clinic [phone] and follow the prompts.  For any non-urgent questions, you may also contact your provider using MyChart. We now offer e-Visits for anyone 18 and older to request care online for non-urgent symptoms. For details visit [email or web address].   Also download the MyChart app! Go to the app store, search "MyChart", open the app, select [place], and log in with your MyChart username and password.  Due to Covid, a mask is required upon entering the hospital/clinic. If you do not have a mask, one will be given to you upon arrival. For doctor visits, patients may have 1 support person aged 18 or older with them. For treatment visits, patients cannot have anyone with them due to current Covid guidelines and our immunocompromised population.  

## 2022-01-31 ENCOUNTER — Encounter: Payer: Self-pay | Admitting: Hematology and Oncology

## 2022-01-31 ENCOUNTER — Encounter (HOSPITAL_COMMUNITY): Payer: Self-pay | Admitting: Hematology

## 2022-02-04 ENCOUNTER — Inpatient Hospital Stay (HOSPITAL_BASED_OUTPATIENT_CLINIC_OR_DEPARTMENT_OTHER): Payer: BC Managed Care – PPO | Admitting: Hematology

## 2022-02-04 VITALS — BP 139/88 | HR 86 | Temp 98.3°F | Resp 18 | Ht 75.0 in | Wt 275.2 lb

## 2022-02-04 DIAGNOSIS — C8118 Nodular sclerosis classical Hodgkin lymphoma, lymph nodes of multiple sites: Secondary | ICD-10-CM | POA: Diagnosis not present

## 2022-02-04 DIAGNOSIS — C8190 Hodgkin lymphoma, unspecified, unspecified site: Secondary | ICD-10-CM | POA: Diagnosis not present

## 2022-02-04 DIAGNOSIS — Z79899 Other long term (current) drug therapy: Secondary | ICD-10-CM | POA: Diagnosis not present

## 2022-02-04 NOTE — Patient Instructions (Addendum)
[place] at [facility] ?Discharge Instructions ? ?You were seen and examined today by Dr. Delton Coombes. He reviewed your most recent labs and everything looks good. Please keep follow up appointments as scheduled in 4 months. ? ? ?Thank you for choosing [place] at [facility] to provide your oncology and hematology care.  To afford each patient quality time with our provider, please arrive at least 15 minutes before your scheduled appointment time.  ? ?If you have a lab appointment with the [facility] please come in thru the Main Entrance and check in at the main information desk. ? ?You need to re-schedule your appointment should you arrive 10 or more minutes late.  We strive to give you quality time with our providers, and arriving late affects you and other patients whose appointments are after yours.  Also, if you no show three or more times for appointments you may be dismissed from the clinic at the providers discretion.     ?Again, thank you for choosing [facility].  Our hope is that these requests will decrease the amount of time that you wait before being seen by our physicians.       ?_____________________________________________________________ ? ?Should you have questions after your visit to [facility], please contact our office at [phone] and follow the prompts.  Our office hours are 8:00 a.m. and 4:30 p.m. Monday - Friday.  Please note that voicemails left after 4:00 p.m. may not be returned until the following business day.  We are closed weekends and major holidays.  You do have access to a nurse 24-7, just call the main number to the clinic [phone] and do not press any options, hold on the line and a nurse will answer the phone.   ? ?For prescription refill requests, have your pharmacy contact our office and allow 72 hours.   ? ?Due to Covid, you will need to wear a mask upon entering the hospital. If you do  not have a mask, a mask will be given to you at the Main Entrance upon arrival. For doctor visits, patients may have 1 support person age 74 or older with them. For treatment visits, patients can not have anyone with them due to social distancing guidelines and our immunocompromised population.  ? ?  ?

## 2022-02-04 NOTE — Progress Notes (Signed)
? ?[facility] ?[address] ?[place], [place] [address] ? ? ?CLINIC:  ?Medical Oncology/Hematology ? ?PCP:  ?Coral Spikes, DO ?[address] [place] [address] ?[phone] ? ? ?REASON FOR VISIT:  ?Follow-up for stage IV AE classical Hodgkin's disease ? ?PRIOR THERAPY: 6 cycles of ABVD from 01/12/2020 through 06/14/2020 ? ?NGS Results: not done ? ?CURRENT THERAPY: surveillance ? ?BRIEF ONCOLOGIC HISTORY:  ?Oncology History  ?Hodgkin's disease ([place])  ?01/06/2020 Initial Diagnosis  ? Hodgkin's disease ([place]) ?  ?01/12/2020 -  Chemotherapy  ? The patient had DOXOrubicin (ADRIAMYCIN) chemo injection 62 mg, 25 mg/m2 = 62 mg, Intravenous,  Once, 6 of 6 cycles ?Administration: 62 mg (01/12/2020), 62 mg (01/26/2020), 62 mg (02/09/2020), 62 mg (02/23/2020), 62 mg (03/08/2020), 62 mg (03/22/2020), 62 mg (04/05/2020), 62 mg (04/19/2020), 62 mg (05/03/2020), 62 mg (05/17/2020), 62 mg (05/31/2020), 62 mg (06/14/2020) ?palonosetron (ALOXI) injection 0.25 mg, 0.25 mg, Intravenous,  Once, 6 of 6 cycles ?Administration: 0.25 mg (01/12/2020), 0.25 mg (01/26/2020), 0.25 mg (02/09/2020), 0.25 mg (02/23/2020), 0.25 mg (03/08/2020), 0.25 mg (03/22/2020), 0.25 mg (04/05/2020), 0.25 mg (04/19/2020), 0.25 mg (05/03/2020), 0.25 mg (05/17/2020), 0.25 mg (05/31/2020), 0.25 mg (06/14/2020) ?bleomycin (BLEOCIN) 25 Units in sodium chloride 0.9 % 50 mL chemo infusion, 10 Units/m2 = 25 Units, Intravenous,  Once, 6 of 6 cycles ?Administration: 25 Units (01/12/2020), 25 Units (01/26/2020), 25 Units (02/09/2020), 25 Units (02/23/2020), 25 Units (03/08/2020), 25 Units (03/22/2020), 25 Units (04/05/2020), 25 Units (04/19/2020), 25 Units (05/03/2020), 25 Units (05/17/2020), 25 Units (05/31/2020), 25 Units (06/14/2020) ?dacarbazine (DTIC) 920 mg in sodium chloride 0.9 % 250 mL chemo infusion, 375 mg/m2 = 920 mg, Intravenous,  Once, 6 of 6 cycles ?Administration: 920 mg (01/12/2020), 920 mg (01/26/2020), 920 mg (02/09/2020), 920 mg (02/23/2020), 920 mg (03/08/2020), 920 mg (03/22/2020),  920 mg (04/05/2020), 920 mg (04/19/2020), 920 mg (05/03/2020), 920 mg (05/17/2020), 920 mg (05/31/2020), 920 mg (06/14/2020) ?fosaprepitant (EMEND) 150 mg in sodium chloride 0.9 % 145 mL IVPB, 150 mg, Intravenous,  Once, 6 of 6 cycles ?Administration: 150 mg (01/12/2020), 150 mg (01/26/2020), 150 mg (02/09/2020), 150 mg (02/23/2020), 150 mg (03/08/2020), 150 mg (03/22/2020), 150 mg (04/05/2020), 150 mg (04/19/2020), 150 mg (05/03/2020), 150 mg (05/17/2020), 150 mg (05/31/2020), 150 mg (06/14/2020) ?vinBLAStine (VELBAN) 15 mg in sodium chloride 0.9 % 50 mL chemo infusion, 6.1 mg/m2 = 14.8 mg, Intravenous, Once, 6 of 6 cycles ?Administration: 15 mg (01/12/2020), 15 mg (01/26/2020), 15 mg (02/09/2020), 15 mg (02/23/2020), 15 mg (03/08/2020), 15 mg (03/22/2020), 15 mg (04/05/2020), 15 mg (04/19/2020), 15 mg (05/03/2020), 15 mg (05/17/2020), 15 mg (05/31/2020), 15 mg (06/14/2020) ? ? for chemotherapy treatment.  ? ?  ? ? ?CANCER STAGING: ? Cancer Staging  ?Hodgkin's disease ([place]) ?Staging form: Hodgkin and Non-Hodgkin Lymphoma, AJCC 8th Edition ?- Clinical stage from 01/06/2020: Stage IV (Hodgkin lymphoma, A - Asymptomatic) - Unsigned ? ? ?INTERVAL HISTORY:  ?Tyler [name], a 26 y.o. male, returns for routine follow-up of his stage IV AE classical Hodgkin's disease. Mahmood was last seen on 10/22/2021.  ? ?Today he reports feeling good. He denies itching, fevers, infections, and night sweats. He reports stable numbness in the 3rd and 4th fingers of his left hand. He reports he fills full easily after eating. He denies weight loss and leg swellings.  ? ?REVIEW OF SYSTEMS:  ?Review of Systems  ?Constitutional:  Negative for appetite change, fatigue, fever and unexpected weight change.  ?Cardiovascular:  Negative for leg swelling.  ?Endocrine: Negative  for hot flashes.  ?Skin:  Negative for itching.  ?Neurological:  Positive for numbness (L hand).  ?Psychiatric/Behavioral:  Positive for sleep disturbance.   ?All other systems reviewed and are  negative. ? ?PAST MEDICAL/SURGICAL HISTORY:  ?Past Medical History:  ?Diagnosis Date  ? ADD (attention deficit disorder)   ? Inattention type  ? Cyclic vomiting syndrome 2009  ? GERD (gastroesophageal reflux disease)   ? Hypertension   ? ?Past Surgical History:  ?Procedure Laterality Date  ? closed reduction rt. wrist    ? IR IMAGING GUIDED PORT INSERTION  12/31/2019  ? MASS BIOPSY Left 01/03/2020  ? Procedure: OPEN NECK BIOPSY;  Surgeon: Leta Baptist, MD;  Location: [facility];  Service: ENT;  Laterality: Left;  ? ? ?SOCIAL HISTORY:  ?Social History  ? ?Socioeconomic History  ? Marital status: Single  ?  Spouse name: Not on file  ? Number of children: 0  ? Years of education: Not on file  ? Highest education level: Not on file  ?Occupational History  ? Not on file  ?Tobacco Use  ? Smoking status: Never  ? Smokeless tobacco: Former  ?Vaping Use  ? Vaping Use: Never used  ?Substance and Sexual Activity  ? Alcohol use: Never  ? Drug use: Never  ? Sexual activity: Not Currently  ?Other Topics Concern  ? Not on file  ?Social History Narrative  ? Not on file  ? ?Social Determinants of Health  ? ?Financial Resource Strain: Not on file  ?Food Insecurity: Not on file  ?Transportation Needs: Not on file  ?Physical Activity: Not on file  ?Stress: Not on file  ?Social Connections: Not on file  ?Intimate Partner Violence: Not on file  ? ? ?FAMILY HISTORY:  ?Family History  ?Problem Relation Age of Onset  ? Cancer Paternal Grandmother   ? Hypertension Mother   ? Hypertension Father   ? ? ?CURRENT MEDICATIONS:  ?Current Outpatient Medications  ?Medication Sig Dispense Refill  ? amLODipine (NORVASC) 10 MG tablet Take 1 tablet (10 mg total) by mouth daily. 90 tablet 3  ? omeprazole (PRILOSEC OTC) 20 MG tablet Take 1 tablet (20 mg total) by mouth daily. 90 tablet 1  ? sodium chloride (OCEAN) 0.65 % SOLN nasal spray Place 1 spray into both nostrils as needed for congestion.    ? ?No current facility-administered medications  for this visit.  ? ?Facility-Administered Medications Ordered in Other Visits  ?Medication Dose Route Frequency Provider Last Rate Last Admin  ? sodium chloride flush (NS) 0.9 % injection 10 mL  10 mL Intravenous PRN Derek Jack, MD   10 mL at 05/19/20 1033  ? ? ?ALLERGIES:  ?Allergies  ?Allergen Reactions  ? Ace Inhibitors Swelling  ? ? ?PHYSICAL EXAM:  ?Performance status (ECOG): 1 - Symptomatic but completely ambulatory ? ?Vitals:  ? 02/04/22 1555  ?BP: 139/88  ?Pulse: 86  ?Resp: 18  ?Temp: 98.3 ?F (36.8 ?C)  ?SpO2: 95%  ? ?Wt Readings from Last 3 Encounters:  ?02/04/22 275 lb 3.2 oz (124.8 kg)  ?10/08/21 276 lb 12.8 oz (125.6 kg)  ?10/04/21 276 lb 0.3 oz (125.2 kg)  ? ?Physical Exam ?Vitals reviewed.  ?Constitutional:   ?   Appearance: Normal appearance. He is obese.  ?Cardiovascular:  ?   Rate and Rhythm: Normal rate and regular rhythm.  ?   Pulses: Normal pulses.  ?   Heart sounds: Normal heart sounds.  ?Pulmonary:  ?   Effort: Pulmonary effort is normal.  ?  Breath sounds: Normal breath sounds.  ?Abdominal:  ?   Palpations: Abdomen is soft. There is no hepatomegaly, splenomegaly or mass.  ?   Tenderness: There is no abdominal tenderness.  ?Musculoskeletal:  ?   Right lower leg: No edema.  ?   Left lower leg: No edema.  ?Lymphadenopathy:  ?   Cervical: No cervical adenopathy.  ?   Right cervical: No superficial cervical adenopathy. ?   Left cervical: No superficial cervical adenopathy.  ?   Upper Body:  ?   Right upper body: No supraclavicular adenopathy.  ?   Left upper body: No supraclavicular adenopathy.  ?   Lower Body: No right inguinal adenopathy. No left inguinal adenopathy.  ?Neurological:  ?   General: No focal deficit present.  ?   Mental Status: He is alert and oriented to person, place, and time.  ?Psychiatric:     ?   Mood and Affect: Mood normal.     ?   Behavior: Behavior normal.  ?  ? ?LABORATORY DATA:  ?I have reviewed the labs as listed.  ? ?  Latest Ref Rng & Units 01/28/2022  ?   2:56 PM 09/27/2021  ?  3:36 PM 05/22/2021  ?  3:25 PM  ?CBC  ?WBC 4.0 - 10.5 K/uL 7.4   6.1   8.4    ?Hemoglobin 13.0 - 17.0 g/dL 13.8   13.8   13.5    ?Hematocrit 39.0 - 52.0 % 41.0   40.4   40.6    ?Platelets 150 - 400 K/u

## 2022-02-08 ENCOUNTER — Encounter (HOSPITAL_COMMUNITY): Payer: Self-pay | Admitting: Hematology

## 2022-02-08 ENCOUNTER — Ambulatory Visit (INDEPENDENT_AMBULATORY_CARE_PROVIDER_SITE_OTHER): Payer: BC Managed Care – PPO | Admitting: Family Medicine

## 2022-02-08 ENCOUNTER — Encounter: Payer: Self-pay | Admitting: Hematology and Oncology

## 2022-02-08 VITALS — BP 149/90 | HR 85 | Temp 98.4°F | Wt 278.0 lb

## 2022-02-08 DIAGNOSIS — J040 Acute laryngitis: Secondary | ICD-10-CM | POA: Diagnosis not present

## 2022-02-08 DIAGNOSIS — J029 Acute pharyngitis, unspecified: Secondary | ICD-10-CM | POA: Insufficient documentation

## 2022-02-08 LAB — POCT RAPID STREP A (OFFICE): Rapid Strep A Screen: NEGATIVE

## 2022-02-08 MED ORDER — LEVOCETIRIZINE DIHYDROCHLORIDE 5 MG PO TABS: 5.0000 mg | ORAL_TABLET | Freq: Every evening | ORAL | 0 refills | Status: DC

## 2022-02-08 NOTE — Patient Instructions (Signed)
Lots of fluids. ? ?Voice rest. ? ?Medication as directed. ? ?Take care ? ?Dr. Lacinda Axon  ?

## 2022-02-08 NOTE — Progress Notes (Signed)
? ?Subjective:  ?Patient ID: Tyler [name], male    DOB: 1996/07/16  Age: 26 y.o. MRN: [id number] ? ?CC: ?Chief Complaint  ?Patient presents with  ? Cough  ? Sore Throat  ? Nasal Congestion  ? ? ?HPI: ? ?26 year old male presents for evaluation of the above. ? ?Patient reports that he has had sore throat and change in his voice over the past week.  He states that it has continued to persist prompting his visit today.  No other significant respiratory symptoms.  No fever.  He states that he is increasing his fluid intake which seems to help as well as using lozenges.  He states that he is not feeling fatigued or feeling unwell.  No other complaints or concerns at this time. ? ?Patient Active Problem List  ? Diagnosis Date Noted  ? Laryngitis 02/08/2022  ? Sore throat 02/08/2022  ? Mixed hyperlipidemia 10/08/2021  ? Hodgkin's disease ([place]) 01/06/2020  ? Essential hypertension 08/29/2015  ? ? ?Social Hx   ?Social History  ? ?Socioeconomic History  ? Marital status: Single  ?  Spouse name: Not on file  ? Number of children: 0  ? Years of education: Not on file  ? Highest education level: Not on file  ?Occupational History  ? Not on file  ?Tobacco Use  ? Smoking status: Never  ? Smokeless tobacco: Former  ?Vaping Use  ? Vaping Use: Never used  ?Substance and Sexual Activity  ? Alcohol use: Never  ? Drug use: Never  ? Sexual activity: Not Currently  ?Other Topics Concern  ? Not on file  ?Social History Narrative  ? Not on file  ? ?Social Determinants of Health  ? ?Financial Resource Strain: Not on file  ?Food Insecurity: Not on file  ?Transportation Needs: Not on file  ?Physical Activity: Not on file  ?Stress: Not on file  ?Social Connections: Not on file  ? ? ?Review of Systems ?Per HPI ? ?Objective:  ?BP (!) 149/90   Pulse 85   Temp 98.4 ?F (36.9 ?C) (Oral)   Wt 278 lb (126.1 kg)   SpO2 99%   BMI 34.75 kg/m?  ? ? ?  02/08/2022  ?  3:28 PM 02/04/2022  ?  3:55 PM 01/28/2022  ?  3:00 PM  ?BP/Weight  ?Systolic BP 270 786  754  ?Diastolic BP 90 88 84  ?Wt. (Lbs) 278 275.2   ?BMI 34.75 kg/m2 34.4 kg/m2   ? ? ?Physical Exam ?Vitals and nursing note reviewed.  ?Constitutional:   ?   General: He is not in acute distress. ?   Appearance: Normal appearance.  ?HENT:  ?   Head: Normocephalic and atraumatic.  ?   Mouth/Throat:  ?   Pharynx: Oropharynx is clear.  ?Eyes:  ?   General:     ?   Right eye: No discharge.     ?   Left eye: No discharge.  ?   Conjunctiva/sclera: Conjunctivae normal.  ?Cardiovascular:  ?   Rate and Rhythm: Normal rate and regular rhythm.  ?Pulmonary:  ?   Effort: Pulmonary effort is normal.  ?   Breath sounds: Normal breath sounds. No wheezing, rhonchi or rales.  ?Neurological:  ?   Mental Status: He is alert.  ?Psychiatric:     ?   Mood and Affect: Mood normal.     ?   Behavior: Behavior normal.  ? ? ?Lab Results  ?Component Value Date  ? WBC 7.4 01/28/2022  ? HGB  13.8 01/28/2022  ? HCT 41.0 01/28/2022  ? PLT 277 01/28/2022  ? GLUCOSE 98 01/28/2022  ? CHOL 203 (H) 10/19/2021  ? TRIG 247 (H) 10/19/2021  ? HDL 28 (L) 10/19/2021  ? LDLCALC 131 (H) 10/19/2021  ? ALT 27 01/28/2022  ? AST 13 (L) 01/28/2022  ? NA 139 01/28/2022  ? K 3.9 01/28/2022  ? CL 106 01/28/2022  ? CREATININE 0.93 01/28/2022  ? BUN 16 01/28/2022  ? CO2 26 01/28/2022  ? HGBA1C 5.9 (H) 10/19/2021  ? ? ? ?Assessment & Plan:  ? ?Problem List Items Addressed This Visit   ? ?  ? Respiratory  ? Laryngitis - Primary  ?  Allergy and postnasal drip versus URI.  Supportive care.  Lots of fluids.  Xyzal as directed. ?  ?  ?  ? Other  ? Sore throat  ? Relevant Orders  ? POCT rapid strep A (Completed)  ? ? ?Meds ordered this encounter  ?Medications  ? levocetirizine (XYZAL) 5 MG tablet  ?  Sig: Take 1 tablet (5 mg total) by mouth every evening.  ?  Dispense:  30 tablet  ?  Refill:  0  ? ? ?Thersa Salt DO ?[place] ? ?

## 2022-02-08 NOTE — Assessment & Plan Note (Signed)
Allergy and postnasal drip versus URI.  Supportive care.  Lots of fluids.  Xyzal as directed. ?

## 2022-02-13 ENCOUNTER — Encounter: Payer: Self-pay | Admitting: Family Medicine

## 2022-02-15 ENCOUNTER — Encounter: Payer: Self-pay | Admitting: Hematology and Oncology

## 2022-02-15 ENCOUNTER — Encounter (HOSPITAL_COMMUNITY): Payer: Self-pay | Admitting: Hematology

## 2022-02-18 ENCOUNTER — Encounter (HOSPITAL_COMMUNITY): Payer: Self-pay | Admitting: Hematology

## 2022-02-18 ENCOUNTER — Encounter: Payer: Self-pay | Admitting: Hematology and Oncology

## 2022-02-21 ENCOUNTER — Encounter (HOSPITAL_COMMUNITY): Payer: Self-pay | Admitting: Hematology

## 2022-02-21 ENCOUNTER — Encounter: Payer: Self-pay | Admitting: Hematology and Oncology

## 2022-04-06 ENCOUNTER — Other Ambulatory Visit: Payer: Self-pay | Admitting: Nurse Practitioner

## 2022-04-09 ENCOUNTER — Ambulatory Visit: Payer: Self-pay | Admitting: Family Medicine

## 2022-04-25 DIAGNOSIS — L600 Ingrowing nail: Secondary | ICD-10-CM | POA: Diagnosis not present

## 2022-04-25 DIAGNOSIS — M79675 Pain in left toe(s): Secondary | ICD-10-CM | POA: Diagnosis not present

## 2022-05-09 DIAGNOSIS — Z09 Encounter for follow-up examination after completed treatment for conditions other than malignant neoplasm: Secondary | ICD-10-CM | POA: Diagnosis not present

## 2022-06-04 ENCOUNTER — Inpatient Hospital Stay: Payer: BC Managed Care – PPO | Attending: Hematology

## 2022-06-04 VITALS — BP 171/91 | HR 100 | Temp 98.1°F | Resp 18 | Ht 74.5 in | Wt 281.0 lb

## 2022-06-04 DIAGNOSIS — G629 Polyneuropathy, unspecified: Secondary | ICD-10-CM | POA: Diagnosis not present

## 2022-06-04 DIAGNOSIS — Z95828 Presence of other vascular implants and grafts: Secondary | ICD-10-CM

## 2022-06-04 DIAGNOSIS — C8190 Hodgkin lymphoma, unspecified, unspecified site: Secondary | ICD-10-CM | POA: Diagnosis not present

## 2022-06-04 DIAGNOSIS — C8118 Nodular sclerosis classical Hodgkin lymphoma, lymph nodes of multiple sites: Secondary | ICD-10-CM

## 2022-06-04 LAB — CBC WITH DIFFERENTIAL/PLATELET
Abs Immature Granulocytes: 0.01 10*3/uL (ref 0.00–0.07)
Basophils Absolute: 0.1 10*3/uL (ref 0.0–0.1)
Basophils Relative: 1 %
Eosinophils Absolute: 0.1 10*3/uL (ref 0.0–0.5)
Eosinophils Relative: 1 %
HCT: 41.4 % (ref 39.0–52.0)
Hemoglobin: 14 g/dL (ref 13.0–17.0)
Immature Granulocytes: 0 %
Lymphocytes Relative: 34 %
Lymphs Abs: 2.8 10*3/uL (ref 0.7–4.0)
MCH: 29.4 pg (ref 26.0–34.0)
MCHC: 33.8 g/dL (ref 30.0–36.0)
MCV: 86.8 fL (ref 80.0–100.0)
Monocytes Absolute: 0.7 10*3/uL (ref 0.1–1.0)
Monocytes Relative: 8 %
Neutro Abs: 4.7 10*3/uL (ref 1.7–7.7)
Neutrophils Relative %: 56 %
Platelets: 298 10*3/uL (ref 150–400)
RBC: 4.77 MIL/uL (ref 4.22–5.81)
RDW: 12.5 % (ref 11.5–15.5)
WBC: 8.3 10*3/uL (ref 4.0–10.5)
nRBC: 0 % (ref 0.0–0.2)

## 2022-06-04 LAB — COMPREHENSIVE METABOLIC PANEL
ALT: 29 U/L (ref 0–44)
AST: 13 U/L — ABNORMAL LOW (ref 15–41)
Albumin: 4.1 g/dL (ref 3.5–5.0)
Alkaline Phosphatase: 76 U/L (ref 38–126)
Anion gap: 8 (ref 5–15)
BUN: 14 mg/dL (ref 6–20)
CO2: 24 mmol/L (ref 22–32)
Calcium: 8.8 mg/dL — ABNORMAL LOW (ref 8.9–10.3)
Chloride: 106 mmol/L (ref 98–111)
Creatinine, Ser: 1.02 mg/dL (ref 0.61–1.24)
GFR, Estimated: >60 mL/min (ref >60)
Glucose, Bld: 112 mg/dL — ABNORMAL HIGH (ref 70–99)
Potassium: 3.6 mmol/L (ref 3.5–5.1)
Sodium: 138 mmol/L (ref 135–145)
Total Bilirubin: 0.3 mg/dL (ref 0.3–1.2)
Total Protein: 7.3 g/dL (ref 6.5–8.1)

## 2022-06-04 LAB — SEDIMENTATION RATE: Sed Rate: 2 mm/hr (ref 0–16)

## 2022-06-04 LAB — LACTATE DEHYDROGENASE: LDH: 148 U/L (ref 98–192)

## 2022-06-04 MED ORDER — HEPARIN SOD (PORK) LOCK FLUSH 100 UNIT/ML IV SOLN
500.0000 [IU] | Freq: Once | INTRAVENOUS | Status: AC
  Administered 2022-06-04: 500 [IU] via INTRAVENOUS

## 2022-06-04 MED ORDER — SODIUM CHLORIDE 0.9% FLUSH
10.0000 mL | Freq: Once | INTRAVENOUS | Status: AC
  Administered 2022-06-04: 10 mL via INTRAVENOUS

## 2022-06-04 NOTE — Patient Instructions (Signed)
[facility]  Discharge Instructions: Thank you for choosing [facility] to provide your oncology and hematology care.  If you have a lab appointment with the [facility], please come in thru the Main Entrance and check in at the main information desk.  Wear comfortable clothing and clothing appropriate for easy access to any Portacath or PICC line.   We strive to give you quality time with your provider. You may need to reschedule your appointment if you arrive late (15 or more minutes).  Arriving late affects you and other patients whose appointments are after yours.  Also, if you miss three or more appointments without notifying the office, you may be dismissed from the clinic at the provider's discretion.      For prescription refill requests, have your pharmacy contact our office and allow 72 hours for refills to be completed.    Today your port was flushed with no blood return noted. Labs drawn from peripheral site, return as scheduled.   To help prevent nausea and vomiting after your treatment, we encourage you to take your nausea medication as directed.  BELOW ARE SYMPTOMS THAT SHOULD BE REPORTED IMMEDIATELY: *FEVER GREATER THAN 100.4 F (38 C) OR HIGHER *CHILLS OR SWEATING *NAUSEA AND VOMITING THAT IS NOT CONTROLLED WITH YOUR NAUSEA MEDICATION *UNUSUAL SHORTNESS OF BREATH *UNUSUAL BRUISING OR BLEEDING *URINARY PROBLEMS (pain or burning when urinating, or frequent urination) *BOWEL PROBLEMS (unusual diarrhea, constipation, pain near the anus) TENDERNESS IN MOUTH AND THROAT WITH OR WITHOUT PRESENCE OF ULCERS (sore throat, sores in mouth, or a toothache) UNUSUAL RASH, SWELLING OR PAIN  UNUSUAL VAGINAL DISCHARGE OR ITCHING   Items with * indicate a potential emergency and should be followed up as soon as possible or go to the Emergency Department if any problems should occur.  Please show the CHEMOTHERAPY ALERT CARD or IMMUNOTHERAPY ALERT CARD at  check-in to the Emergency Department and triage nurse.  Should you have questions after your visit or need to cancel or reschedule your appointment, please contact [facility] [phone]  and follow the prompts.  Office hours are 8:00 a.m. to 4:30 p.m. Monday - Friday. Please note that voicemails left after 4:00 p.m. may not be returned until the following business day.  We are closed weekends and major holidays. You have access to a nurse at all times for urgent questions. Please call the main number to the clinic [phone] and follow the prompts.  For any non-urgent questions, you may also contact your provider using MyChart. We now offer e-Visits for anyone 36 and older to request care online for non-urgent symptoms. For details visit [email or web address].   Also download the MyChart app! Go to the app store, search "MyChart", open the app, select [facility], and log in with your MyChart username and password.  Masks are optional in the cancer centers. If you would like for your care team to wear a mask while they are taking care of you, please let them know. For doctor visits, patients may have with them one support person who is at least 26 years old. At this time, visitors are not allowed in the infusion area.

## 2022-06-04 NOTE — Progress Notes (Signed)
Port flushed with no blood return noted. No bruising or swelling at site. Labs drawn from peripheral site, Band-Aid applied and patient discharged in satisfactory condition. VVS stable with no signs or symptoms of distressed noted.

## 2022-06-06 ENCOUNTER — Ambulatory Visit (HOSPITAL_COMMUNITY)
Admission: RE | Admit: 2022-06-06 | Discharge: 2022-06-06 | Disposition: A | Discharge status: Home / Self Care | Payer: BC Managed Care – PPO | Source: Ambulatory Visit | Attending: Hematology | Admitting: Hematology

## 2022-06-06 DIAGNOSIS — R188 Other ascites: Secondary | ICD-10-CM | POA: Diagnosis not present

## 2022-06-06 DIAGNOSIS — R590 Localized enlarged lymph nodes: Secondary | ICD-10-CM | POA: Diagnosis not present

## 2022-06-06 DIAGNOSIS — C8118 Nodular sclerosis classical Hodgkin lymphoma, lymph nodes of multiple sites: Secondary | ICD-10-CM | POA: Diagnosis not present

## 2022-06-06 DIAGNOSIS — K4090 Unilateral inguinal hernia, without obstruction or gangrene, not specified as recurrent: Secondary | ICD-10-CM | POA: Diagnosis not present

## 2022-06-06 DIAGNOSIS — C8190 Hodgkin lymphoma, unspecified, unspecified site: Secondary | ICD-10-CM | POA: Diagnosis not present

## 2022-06-06 DIAGNOSIS — K661 Hemoperitoneum: Secondary | ICD-10-CM | POA: Diagnosis not present

## 2022-06-06 DIAGNOSIS — R161 Splenomegaly, not elsewhere classified: Secondary | ICD-10-CM | POA: Diagnosis not present

## 2022-06-06 MED ORDER — IOHEXOL 300 MG/ML  SOLN
100.0000 mL | Freq: Once | INTRAMUSCULAR | Status: AC | PRN
  Administered 2022-06-06: 100 mL via INTRAVENOUS

## 2022-06-13 ENCOUNTER — Encounter: Payer: Self-pay | Admitting: Hematology

## 2022-06-13 ENCOUNTER — Inpatient Hospital Stay (HOSPITAL_BASED_OUTPATIENT_CLINIC_OR_DEPARTMENT_OTHER): Payer: BC Managed Care – PPO | Admitting: Hematology

## 2022-06-13 VITALS — BP 138/88 | HR 75 | Temp 99.0°F | Resp 16 | Wt 275.9 lb

## 2022-06-13 DIAGNOSIS — C8190 Hodgkin lymphoma, unspecified, unspecified site: Secondary | ICD-10-CM | POA: Diagnosis not present

## 2022-06-13 DIAGNOSIS — C8118 Nodular sclerosis classical Hodgkin lymphoma, lymph nodes of multiple sites: Secondary | ICD-10-CM

## 2022-06-13 DIAGNOSIS — G629 Polyneuropathy, unspecified: Secondary | ICD-10-CM | POA: Diagnosis not present

## 2022-06-13 NOTE — Progress Notes (Signed)
Tyler [name], Tyler [name]   CLINIC:  Medical Oncology/Hematology  PCP:  Tyler Spikes, DO [address] [place] [address] [phone]   REASON FOR VISIT:  Follow-up for stage IV AE classical Hodgkin's disease  PRIOR THERAPY: 6 cycles of ABVD from 01/12/2020 through 06/14/2020  NGS Results: not done  CURRENT THERAPY: surveillance  BRIEF ONCOLOGIC HISTORY:  Oncology History  Hodgkin's disease ([place])  01/06/2020 Initial Diagnosis   Hodgkin's disease ([place])   01/12/2020 - 06/14/2020 Chemotherapy   Patient is on Treatment Plan : HODGKINS LYMPHOMA ABVD q28d x 2 cycles       CANCER STAGING:  Cancer Staging  Hodgkin's disease ([place]) Staging form: Hodgkin and Non-Hodgkin Lymphoma, AJCC 8th Edition - Clinical stage from 01/06/2020: Stage IV (Hodgkin lymphoma, A - Asymptomatic) - Unsigned   INTERVAL HISTORY:  Tyler [name], a 26 y.o. male, returns for follow-up of stage IV classical Hodgkin's disease.  He denies any fevers, night sweats or weight loss in the last 4 months.  Appetite and energy [name] are 100%.  His port did not give blood return.  His wife reports that his hands and fingers were swollen when he was walking [place] for about 3 hours.  They became normal while he drove back home.  REVIEW OF SYSTEMS:  Review of Systems  Constitutional:  Negative for appetite change, fatigue, fever and unexpected weight change.  Cardiovascular:  Negative for leg swelling.  Endocrine: Negative for hot flashes.  Skin:  Negative for itching.  Neurological:  Positive for numbness (L hand third and fourth fingers).  Psychiatric/Behavioral:  Negative for sleep disturbance.   All other systems reviewed and are negative.   PAST MEDICAL/SURGICAL HISTORY:  Past Medical History:  Diagnosis Date   ADD (attention deficit disorder)    Inattention type   Cyclic vomiting syndrome 2009   GERD (gastroesophageal reflux disease)     Hypertension    Past Surgical History:  Procedure Laterality Date   closed reduction rt. wrist     IR IMAGING GUIDED PORT INSERTION  12/31/2019   MASS BIOPSY Left 01/03/2020   Procedure: OPEN NECK BIOPSY;  Surgeon: Leta Baptist, MD;  Location: [facility];  Service: ENT;  Laterality: Left;    SOCIAL HISTORY:  Social History   Socioeconomic History   Marital status: Single    Spouse name: Not on file   Number of children: 0   Years of education: Not on file   Highest education level: Not on file  Occupational History   Not on file  Tobacco Use   Smoking status: Never   Smokeless tobacco: Former  Scientific laboratory technician Use: Never used  Substance and Sexual Activity   Alcohol use: Never   Drug use: Never   Sexual activity: Not Currently  Other Topics Concern   Not on file  Social History Narrative   Not on file   Social Determinants of Health   Financial Resource Strain: Low Risk  (12/28/2019)   Overall Financial Resource Strain (CARDIA)    Difficulty of Paying Living Expenses: Not hard at all  Food Insecurity: No Food Insecurity (12/28/2019)   Hunger Vital Sign    Worried About Running Out of Food in the Last Year: Never true    [place] in the Last Year: Never true  Transportation Needs: No Transportation Needs (12/28/2019)   [place] - Transportation  Lack of Transportation (Medical): No    Lack of Transportation (Non-Medical): No  Physical Activity: Inactive (12/28/2019)   Exercise Vital Sign    Days of Exercise per Week: 0 days    Minutes of Exercise per Session: 0 min  Stress: No Stress Concern Present (12/28/2019)   [place]    Feeling of Stress : Not at all  Social Connections: Moderately Isolated (12/28/2019)   Social Connection and Isolation Panel [NHANES]    Frequency of Communication with Friends and Family: More than three times a week    Frequency of Social Gatherings with  Friends and Family: More than three times a week    Attends Religious Services: More than 4 times per year    Active Member of [employer] or Organizations: No    Attends Archivist Meetings: Never    Marital Status: Never married  Intimate Partner Violence: Not At Risk (12/28/2019)   Humiliation, Afraid, Rape, and Kick questionnaire    Fear of Current or Ex-Partner: No    Emotionally Abused: No    Physically Abused: No    Sexually Abused: No    FAMILY HISTORY:  Family History  Problem Relation Age of Onset   Cancer Paternal Grandmother    Hypertension Mother    Hypertension Father     CURRENT MEDICATIONS:  Current Outpatient Medications  Medication Sig Dispense Refill   amLODipine (NORVASC) 10 MG tablet Take 1 tablet (10 mg total) by mouth daily. 90 tablet 3   levocetirizine (XYZAL) 5 MG tablet Take 1 tablet (5 mg total) by mouth every evening. 30 tablet 0   omeprazole (PRILOSEC OTC) 20 MG tablet Take 1 tablet (20 mg total) by mouth daily. 90 tablet 1   sodium chloride (OCEAN) 0.65 % SOLN nasal spray Place 1 spray into both nostrils as needed for congestion.     No current facility-administered medications for this visit.   Facility-Administered Medications Ordered in Other Visits  Medication Dose Route Frequency Provider Last Rate Last Admin   sodium chloride flush (NS) 0.9 % injection 10 mL  10 mL Intravenous PRN Derek Jack, MD   10 mL at 05/19/20 1033    ALLERGIES:  Allergies  Allergen Reactions   Ace Inhibitors Swelling    PHYSICAL EXAM:  Performance status (ECOG): 1 - Symptomatic but completely ambulatory  Vitals:   06/13/22 1534  BP: 138/88  Pulse: 75  Resp: 16  Temp: 99 F (37.2 C)  SpO2: 99%   Wt Readings from Last 3 Encounters:  06/13/22 275 lb 14.1 oz (125.1 kg)  06/04/22 281 lb (127.5 kg)  02/08/22 278 lb (126.1 kg)   Physical Exam Vitals reviewed.  Constitutional:      Appearance: Normal appearance. He is obese.  Cardiovascular:      Rate and Rhythm: Normal rate and regular rhythm.     Pulses: Normal pulses.     Heart sounds: Normal heart sounds.  Pulmonary:     Effort: Pulmonary effort is normal.     Breath sounds: Normal breath sounds.  Abdominal:     Palpations: Abdomen is soft. There is no hepatomegaly, splenomegaly or mass.     Tenderness: There is no abdominal tenderness.  Musculoskeletal:     Right lower leg: No edema.     Left lower leg: No edema.  Lymphadenopathy:     Cervical: No cervical adenopathy.     Right cervical: No superficial cervical adenopathy.    Left  cervical: No superficial cervical adenopathy.     Upper Body:     Right upper body: No supraclavicular adenopathy.     Left upper body: No supraclavicular adenopathy.     Lower Body: No right inguinal adenopathy. No left inguinal adenopathy.  Neurological:     General: No focal deficit present.     Mental Status: He is alert and oriented to person, place, and time.  Psychiatric:        Mood and Affect: Mood normal.        Behavior: Behavior normal.      LABORATORY DATA:  I have reviewed the labs as listed.     Latest Ref Rng & Units 06/04/2022    3:52 PM 01/28/2022    2:56 PM 09/27/2021    3:36 PM  CBC  WBC 4.0 - 10.5 K/uL 8.3  7.4  6.1   Hemoglobin 13.0 - 17.0 g/dL 14.0  13.8  13.8   Hematocrit 39.0 - 52.0 % 41.4  41.0  40.4   Platelets 150 - 400 K/uL 298  277  246       Latest Ref Rng & Units 06/04/2022    3:52 PM 01/28/2022    2:56 PM 09/27/2021    3:36 PM  CMP  Glucose 70 - 99 mg/dL 112  98  116   BUN 6 - 20 mg/dL '14  16  12   '$ Creatinine 0.61 - 1.24 mg/dL 1.02  0.93  0.92   Sodium 135 - 145 mmol/L 138  139  136   Potassium 3.5 - 5.1 mmol/L 3.6  3.9  3.7   Chloride 98 - 111 mmol/L 106  106  103   CO2 22 - 32 mmol/L '24  26  23   '$ Calcium 8.9 - 10.3 mg/dL 8.8  9.0  8.6   Total Protein 6.5 - 8.1 g/dL 7.3  7.6  7.1   Total Bilirubin 0.3 - 1.2 mg/dL 0.3  0.3  0.4   Alkaline Phos 38 - 126 U/L 76  73  75   AST 15 - 41 U/L '13  13   14   '$ ALT 0 - 44 U/L '29  27  28     '$ DIAGNOSTIC IMAGING:  I have independently reviewed the scans and discussed with the patient. CT CHEST ABDOMEN PELVIS W CONTRAST  Result Date: 06/07/2022 CLINICAL DATA:  Hodgkin lymphoma. Status post chemotherapy. * Tracking Code: BO * EXAM: CT CHEST, ABDOMEN, AND PELVIS WITH CONTRAST TECHNIQUE: Multidetector CT imaging of the chest, abdomen and pelvis was performed following the standard protocol during bolus administration of intravenous contrast. RADIATION DOSE REDUCTION: This exam was performed according to the departmental dose-optimization program which includes automated exposure control, adjustment of the mA and/or kV according to patient size and/or use of iterative reconstruction technique. CONTRAST:  139m OMNIPAQUE IOHEXOL 300 MG/ML  SOLN COMPARISON:  09/28/2021. FINDINGS: CT CHEST FINDINGS Cardiovascular: Right IJ Port-A-Cath terminates in the right atrium. Vascular structures are unremarkable. Heart size normal. No pericardial effusion. Mediastinum/Nodes: Mediastinal lymph nodes measure up to 8 mm in the left superior mediastinum (2/12), unchanged. No hilar or axillary adenopathy. Supraclavicular lymph nodes are not enlarged by CT size criteria. Esophagus is unremarkable. Lungs/Pleura: Lungs are clear. No pleural fluid. Airway is unremarkable. Musculoskeletal: None. CT ABDOMEN PELVIS FINDINGS Hepatobiliary: Liver and gallbladder are unremarkable. No biliary ductal dilatation. Pancreas: Negative. Spleen: Negative.  Measures 13.0 cm. Adrenals/Urinary Tract: Adrenal glands and kidneys are unremarkable. Ureters are decompressed. Bladder is very low  in volume. Stomach/Bowel: Stomach, small bowel, appendix and colon are unremarkable. Vascular/Lymphatic: Several small abdominal peritoneal ligament, small bowel mesenteric, retroperitoneal and inguinal lymph nodes, none of which meet CT size criteria and all of which appear stable from 09/28/2021. Reproductive:  Prostate is visualized. Other: Small left inguinal hernia contains fat. No free fluid. Mesenteries and peritoneum are otherwise unremarkable. Musculoskeletal: None. IMPRESSION: 1. Lymph nodes throughout the chest, abdomen and pelvis are not enlarged by CT size criteria and appear stable from 09/28/2021. 2. Borderline splenomegaly, stable. Electronically Signed   By: Lorin Picket M.D.   On: 06/07/2022 15:29     ASSESSMENT:  1.  Stage IV AE classical Hodgkin's disease: -PET scan on 01/05/2020 shows hypermetabolic left cervical, supraclavicular, bilateral mediastinal adenopathy.  --Hypermetabolic porta hepatic lymph nodes.  --Mildly increased FDG uptake associated with spleen which is above liver activity.  Diffuse increase uptake in the axial and proximal appendicular skeleton. -IPI score of 3. -6 cycles of ABVD from 01/12/2020 through 06/14/2020. -PET2 on 03/06/2010 shows left lower neck lymph node decreased in size and metabolic some.  Measures 2 cm, previously 3.7 cm with SUV 1.9, previously 14.5.  1.6 cm left supraclavicular lymph node with SUV 3.0, previously 2.6 cm with SUV 9.8.  Left prevascular lymph node decreased to 1.9 cm from 5.3 cm.  Right prevascular lymph node decreased to 0.8 cm, previously 2.3 cm.  Borderline prominent 0.9 cm right external iliac lymph node with mild hypermetabolism with maximum SUV 3.5 new. -The right external iliac lymph node is likely reactive.  I have recommended continuing 2 more cycles of ABVD and repeat PET scan after cycle 4. -PET 4 on 04/28/2020 shows previously described right external iliac lymph node measures 0.8 cm, previously 0.9 cm with maximum SUV 1.9, previously 3.5.  Significantly reduced size of the left lower neck and mediastinal lymph nodes, Deauville 3 activity. -PET scan on 07/10/2020 showed a 13 mm short axis left supraclavicular node SUV 1.9.  Previous SUV 2.2.  10 mm short axis left superior mediastinal node SUV 1.9, previously 2.0.  No other  adenopathy was seen.  Deauville 3 activity. -XRT was not offered as he did not have bulky disease (more than 10 cm). -CT CAP on 02/05/2021 showed left supraclavicular and superior mediastinal lymph node slightly diminished in size compared to prior exam.  Supraclavicular lymph node measures 1.3 x 0.8 cm previously 2.2 x 1.3 cm.  No other adenopathy.  Mild splenomegaly, maximum coronal span 13.8 cm.   PLAN:  1.  Stage IV classical Hodgkin's disease: - No B symptoms or infections. - Reviewed labs which were normal.  LDH was also normal. - CT CAP (06/06/2022) showed subcentimeter lymph nodes stable from CT scan from December.  Borderline splenomegaly measuring 13 cm is also stable. - He will have port discontinued by IR. - RTC 6 months with physical exam and labs.  Plan on repeating CT CAP in 1 year.   2.  Peripheral neuropathy: - On and off numbness in the left hand third and fourth fingers is stable.   Orders placed this encounter:  Orders Placed This Encounter  Procedures   CBC with Differential/Platelet   Comprehensive metabolic panel   Lactate dehydrogenase   Sedimentation rate      Derek Jack, MD [place] [phone]

## 2022-06-13 NOTE — Patient Instructions (Addendum)
[facility] Discharge Instructions  You were seen and examined today by Dr. Delton Coombes.  Dr. Delton Coombes discussed your most recent lab work and CT scan which revealed that everything is looking good.  You can have your port removed.  Follow-up as scheduled in 6 months with labs.    Thank you for choosing [facility] at [facility] to provide your oncology and hematology care.  To afford each patient quality time with our provider, please arrive at least 15 minutes before your scheduled appointment time.   If you have a lab appointment with the [facility] please come in thru the Main Entrance and check in at the main information desk.  You need to re-schedule your appointment should you arrive 10 or more minutes late.  We strive to give you quality time with our providers, and arriving late affects you and other patients whose appointments are after yours.  Also, if you no show three or more times for appointments you may be dismissed from the clinic at the providers discretion.     Again, thank you for choosing [facility].  Our hope is that these requests will decrease the amount of time that you wait before being seen by our physicians.       _____________________________________________________________  Should you have questions after your visit to [facility], please contact our office at [phone] and follow the prompts.  Our office hours are 8:00 a.m. and 4:30 p.m. Monday - Friday.  Please note that voicemails left after 4:00 p.m. may not be returned until the following business day.  We are closed weekends and major holidays.  You do have access to a nurse 24-7, just call the main number to the clinic [phone] and do not press any options, hold on the line and a nurse will answer the phone.    For prescription refill requests, have your pharmacy contact our office and allow 72 hours.

## 2022-07-26 ENCOUNTER — Other Ambulatory Visit: Payer: Self-pay

## 2022-07-26 DIAGNOSIS — Z95828 Presence of other vascular implants and grafts: Secondary | ICD-10-CM

## 2022-07-26 DIAGNOSIS — C8118 Nodular sclerosis classical Hodgkin lymphoma, lymph nodes of multiple sites: Secondary | ICD-10-CM

## 2022-07-26 NOTE — Progress Notes (Signed)
Patient has requested port removal. Order placed for IR port removal per verbal order from Dr. Delton Coombes.

## 2022-08-01 ENCOUNTER — Other Ambulatory Visit: Payer: Self-pay | Admitting: Radiology

## 2022-08-06 ENCOUNTER — Ambulatory Visit (HOSPITAL_COMMUNITY)
Admission: RE | Admit: 2022-08-06 | Discharge: 2022-08-06 | Disposition: A | Discharge status: Home / Self Care | Payer: BC Managed Care – PPO | Source: Ambulatory Visit | Attending: Hematology | Admitting: Hematology

## 2022-08-06 DIAGNOSIS — Z9221 Personal history of antineoplastic chemotherapy: Secondary | ICD-10-CM | POA: Diagnosis not present

## 2022-08-06 DIAGNOSIS — C8118 Nodular sclerosis classical Hodgkin lymphoma, lymph nodes of multiple sites: Secondary | ICD-10-CM | POA: Insufficient documentation

## 2022-08-06 DIAGNOSIS — Z95828 Presence of other vascular implants and grafts: Secondary | ICD-10-CM

## 2022-08-06 DIAGNOSIS — Z452 Encounter for adjustment and management of vascular access device: Secondary | ICD-10-CM | POA: Diagnosis not present

## 2022-08-06 DIAGNOSIS — K219 Gastro-esophageal reflux disease without esophagitis: Secondary | ICD-10-CM | POA: Insufficient documentation

## 2022-08-06 DIAGNOSIS — I10 Essential (primary) hypertension: Secondary | ICD-10-CM | POA: Diagnosis not present

## 2022-08-06 HISTORY — PX: IR REMOVAL TUN ACCESS W/ PORT W/O FL MOD SED: IMG2290

## 2022-08-06 MED ORDER — MIDAZOLAM HCL 2 MG/2ML IJ SOLN
INTRAMUSCULAR | Status: AC
  Filled 2022-08-06: qty 2

## 2022-08-06 MED ORDER — SODIUM CHLORIDE 0.9 % IV SOLN: INTRAVENOUS | Status: DC

## 2022-08-06 MED ORDER — FENTANYL CITRATE (PF) 100 MCG/2ML IJ SOLN
INTRAMUSCULAR | Status: AC
  Filled 2022-08-06: qty 2

## 2022-08-06 MED ORDER — MIDAZOLAM HCL 2 MG/2ML IJ SOLN
INTRAMUSCULAR | Status: AC | PRN
  Administered 2022-08-06 (×3): 1 mg via INTRAVENOUS

## 2022-08-06 MED ORDER — FENTANYL CITRATE (PF) 100 MCG/2ML IJ SOLN
INTRAMUSCULAR | Status: AC | PRN
  Administered 2022-08-06 (×2): 50 ug via INTRAVENOUS

## 2022-08-06 MED ORDER — LIDOCAINE HCL 1 % IJ SOLN
INTRAMUSCULAR | Status: AC | PRN
  Administered 2022-08-06: 10 mL via INTRADERMAL

## 2022-08-06 MED ORDER — LIDOCAINE HCL 1 % IJ SOLN
INTRAMUSCULAR | Status: AC
  Filled 2022-08-06: qty 20

## 2022-08-06 NOTE — H&P (Signed)
Chief Complaint: Completed chemotherapy. No longer needed. Request is for portacath removal.   Referring Physician(s): Katragadda,Sreedhar  Supervising Physician: Corrie Mckusick  Patient Status: [facility] - Out-pt  History of Present Illness: Tyler [name] is a 26 y.o. male outpatient. History of HTN, GERD, Hodgkin lymphoma,  IR placed a RIJ portacath for chemotherapy access on 3.5.21. Patient has since completed chemotherapy. Team is requesting a portacath removal.  Currently without any significant complaints. Patient alert and laying in bed,calm. Denies any fevers, headache, chest pain, SOB, cough, abdominal pain, nausea, vomiting or bleeding. Return precautions and treatment recommendations and follow-up discussed with the patient  who is agreeable with the plan.    Past Medical History:  Diagnosis Date   ADD (attention deficit disorder)    Inattention type   Cyclic vomiting syndrome 2009   GERD (gastroesophageal reflux disease)    Hypertension     Past Surgical History:  Procedure Laterality Date   closed reduction rt. wrist     IR IMAGING GUIDED PORT INSERTION  12/31/2019   MASS BIOPSY Left 01/03/2020   Procedure: OPEN NECK BIOPSY;  Surgeon: Leta Baptist, MD;  Location: [facility];  Service: ENT;  Laterality: Left;    Allergies: Ace inhibitors  Medications: Prior to Admission medications   Medication Sig Start Date End Date Taking? Authorizing Provider  amLODipine (NORVASC) 10 MG tablet Take 1 tablet (10 mg total) by mouth daily. Patient taking differently: Take 10 mg by mouth at bedtime. 10/08/21  Yes Cook, Jayce G, DO  levocetirizine (XYZAL) 5 MG tablet Take 1 tablet (5 mg total) by mouth every evening. Patient taking differently: Take 5 mg by mouth daily as needed for allergies. 02/08/22  Yes Cook, Jayce G, DO  omeprazole (PRILOSEC OTC) 20 MG tablet Take 1 tablet (20 mg total) by mouth daily. Patient taking differently: Take 20 mg by mouth at bedtime.  08/30/19  Yes Mikey Kirschner, MD  sodium chloride (OCEAN) 0.65 % SOLN nasal spray Place 1 spray into both nostrils as needed for congestion.   Yes [provider]     Family History  Problem Relation Age of Onset   Cancer Paternal Grandmother    Hypertension Mother    Hypertension Father     Social History   Socioeconomic History   Marital status: Single    Spouse name: Not on file   Number of children: 0   Years of education: Not on file   Highest education level: Not on file  Occupational History   Not on file  Tobacco Use   Smoking status: Never   Smokeless tobacco: Former  Scientific laboratory technician Use: Never used  Substance and Sexual Activity   Alcohol use: Never   Drug use: Never   Sexual activity: Not Currently  Other Topics Concern   Not on file  Social History Narrative   Not on file   Social Determinants of Health   Financial Resource Strain: Low Risk  (12/28/2019)   Overall Financial Resource Strain (CARDIA)    Difficulty of Paying Living Expenses: Not hard at all  Food Insecurity: No Food Insecurity (12/28/2019)   Hunger Vital Sign    Worried About Running Out of Food in the Last Year: Never true    [place] in the Last Year: Never true  Transportation Needs: No Transportation Needs (12/28/2019)   PRAPARE - Hydrologist (Medical): No    Lack of Transportation (Non-Medical):  No  Physical Activity: Inactive (12/28/2019)   Exercise Vital Sign    Days of Exercise per Week: 0 days    Minutes of Exercise per Session: 0 min  Stress: No Stress Concern Present (12/28/2019)   [place]    Feeling of Stress : Not at all  Social Connections: Moderately Isolated (12/28/2019)   Social Connection and Isolation Panel [NHANES]    Frequency of Communication with Friends and Family: More than three times a week    Frequency of Social Gatherings with Friends and Family:  More than three times a week    Attends Religious Services: More than 4 times per year    Active Member of [employer] or Organizations: No    Attends Archivist Meetings: Never    Marital Status: Never married     Review of Systems: A 12 point ROS discussed and pertinent positives are indicated in the HPI above.  All other systems are negative.  Review of Systems  Constitutional:  Negative for fever.  HENT:  Negative for congestion.   Respiratory:  Negative for cough and shortness of breath.   Cardiovascular:  Negative for chest pain.  Gastrointestinal:  Negative for abdominal pain.  Neurological:  Negative for headaches.  Psychiatric/Behavioral:  Negative for behavioral problems and confusion.     Vital Signs: There were no vitals taken for this visit.    Physical Exam Vitals and nursing note reviewed.  Constitutional:      Appearance: He is well-developed.  HENT:     Head: Normocephalic.  Cardiovascular:     Rate and Rhythm: Normal rate and regular rhythm.     Heart sounds: Normal heart sounds.  Pulmonary:     Effort: Pulmonary effort is normal.  Musculoskeletal:        General: Normal range of motion.     Cervical back: Normal range of motion.  Skin:    General: Skin is dry.  Neurological:     Mental Status: He is alert and oriented to person, place, and time.     Imaging: No results found.  Labs:  CBC: Recent Labs    09/27/21 1536 01/28/22 1456 06/04/22 1552  WBC 6.1 7.4 8.3  HGB 13.8 13.8 14.0  HCT 40.4 41.0 41.4  PLT 246 277 298    COAGS: No results for input(s): "INR", "APTT" in the last 8760 hours.  BMP: Recent Labs    09/27/21 1536 01/28/22 1456 06/04/22 1552  NA 136 139 138  K 3.7 3.9 3.6  CL 103 106 106  CO2 '23 26 24  '$ GLUCOSE 116* 98 112*  BUN '12 16 14  '$ CALCIUM 8.6* 9.0 8.8*  CREATININE 0.92 0.93 1.02  GFRNONAA >60 >60 >60    LIVER FUNCTION TESTS: Recent Labs    09/27/21 1536 01/28/22 1456 06/04/22 1552  BILITOT  0.4 0.3 0.3  AST 14* 13* 13*  ALT '28 27 29  '$ ALKPHOS 75 73 76  PROT 7.1 7.6 7.3  ALBUMIN 4.1 4.3 4.1     Assessment and Plan:  26 y.o. male outpatient. History of HTN, GERD, Hodgkin lymphoma,  IR placed a RIJ portacath for chemotherapy access on 3.5.21. Patient has since completed chemotherapy. Team is requesting a portacath removal.  No recent labs. All medications are within acceptable parameters. No pertinent allergies. Patient has been NPO since midnight.   Risks and benefits of image guided port-a-catheter placement was discussed with the patient including, but not limited to  bleeding, infection, pneumothorax, or fibrin sheath development and need for additional procedures.  All of the patient's questions were answered, patient is agreeable to proceed. Consent signed and in chart.   Thank you for this interesting consult.  I greatly enjoyed meeting IVORY BAIL and look forward to participating in their care.  A copy of this report was sent to the requesting provider on this date.  Electronically Signed: Jacqualine Mau, NP 08/06/2022, 10:07 AM   I spent a total of  30 Minutes   in face to face in clinical consultation, greater than 50% of which was counseling/coordinating care for portacath removal.

## 2022-08-06 NOTE — Procedures (Signed)
Interventional Radiology Procedure Note  Procedure: Removal of a right IJ approach single lumen PowerPort.    Complications: None Recommendations:  - Ok to shower tomorrow - Do not submerge for 7 days - Routine wound care   Signed,  River Mckercher S. Wrigley Winborne, DO   

## 2022-09-10 ENCOUNTER — Encounter: Payer: Self-pay | Admitting: Family Medicine

## 2022-09-10 ENCOUNTER — Ambulatory Visit (INDEPENDENT_AMBULATORY_CARE_PROVIDER_SITE_OTHER): Payer: BC Managed Care – PPO | Admitting: Family Medicine

## 2022-09-10 VITALS — BP 140/80 | HR 77 | Temp 97.1°F | Ht 75.0 in | Wt 281.0 lb

## 2022-09-10 DIAGNOSIS — E669 Obesity, unspecified: Secondary | ICD-10-CM

## 2022-09-10 DIAGNOSIS — Z0000 Encounter for general adult medical examination without abnormal findings: Secondary | ICD-10-CM | POA: Diagnosis not present

## 2022-09-10 DIAGNOSIS — I10 Essential (primary) hypertension: Secondary | ICD-10-CM

## 2022-09-10 DIAGNOSIS — Z8571 Personal history of Hodgkin lymphoma: Secondary | ICD-10-CM | POA: Diagnosis not present

## 2022-09-10 NOTE — Progress Notes (Signed)
New Patient Office Visit  Subjective    Patient ID: Tyler [name], male    DOB: 02-01-1996  Age: 26 y.o. MRN: [id number]  CC:  Chief Complaint  Patient presents with   Establish Care    Refill on b/p meds    HPI Tyler [name] presents to establish care. Oriented to practice routines and expectations. His medical history is significant for Non-Hodgkin Lymphona diagnosed 2 years ago, HTN, GERD, and ADD. Concerns today include none. Last physical with labs was > 1 year ago. Consumes a regular diet with lots of meat and bread. Does not have an exercise routine.   NHL denies fever, night sweats and weight loss. Port removed in September. HTN well controlled on Amlodipine '10mg'$  daily. Denies headache, dizziness, shortness of breath, palpitations, chest pain, lower extremity edema. GERD well controlled on '20mg'$  Omeprazole daily.  ADD well controlled and unmedicated.  Weight management discussed- admits to high caloric intake STI declined Vaccines declined Denies drugs, ETOH, tobacco   Outpatient Encounter Medications as of 09/10/2022  Medication Sig   amLODipine (NORVASC) 10 MG tablet Take 1 tablet (10 mg total) by mouth daily. (Patient taking differently: Take 10 mg by mouth at bedtime.)   omeprazole (PRILOSEC OTC) 20 MG tablet Take 1 tablet (20 mg total) by mouth daily. (Patient taking differently: Take 20 mg by mouth at bedtime.)   sodium chloride (OCEAN) 0.65 % SOLN nasal spray Place 1 spray into both nostrils as needed for congestion.   [DISCONTINUED] levocetirizine (XYZAL) 5 MG tablet Take 1 tablet (5 mg total) by mouth every evening. (Patient taking differently: Take 5 mg by mouth daily as needed for allergies.)   Facility-Administered Encounter Medications as of 09/10/2022  Medication   sodium chloride flush (NS) 0.9 % injection 10 mL    Past Medical History:  Diagnosis Date   ADD (attention deficit disorder)    Inattention type   Cyclic vomiting syndrome 2009   GERD  (gastroesophageal reflux disease)    Hypertension     Past Surgical History:  Procedure Laterality Date   closed reduction rt. wrist     IR IMAGING GUIDED PORT INSERTION  12/31/2019   IR REMOVAL TUN ACCESS W/ PORT W/O FL MOD SED  08/06/2022   MASS BIOPSY Left 01/03/2020   Procedure: OPEN NECK BIOPSY;  Surgeon: Leta Baptist, MD;  Location: [facility];  Service: ENT;  Laterality: Left;    Family History  Problem Relation Age of Onset   Cancer Paternal Grandmother    Hypertension Mother    Hypertension Father     Social History   Socioeconomic History   Marital status: Single    Spouse name: Not on file   Number of children: 0   Years of education: Not on file   Highest education level: Not on file  Occupational History   Not on file  Tobacco Use   Smoking status: Never   Smokeless tobacco: Former  Scientific laboratory technician Use: Never used  Substance and Sexual Activity   Alcohol use: Never   Drug use: Never   Sexual activity: Not Currently  Other Topics Concern   Not on file  Social History Narrative   Not on file   Social Determinants of Health   Financial Resource Strain: Low Risk  (12/28/2019)   Overall Financial Resource Strain (CARDIA)    Difficulty of Paying Living Expenses: Not hard at all  Food Insecurity: No Food Insecurity (12/28/2019)   Hunger  Vital Sign    Worried About Charity fundraiser in the Last Year: Never true    Ran Out of Food in the Last Year: Never true  Transportation Needs: No Transportation Needs (12/28/2019)   PRAPARE - Hydrologist (Medical): No    Lack of Transportation (Non-Medical): No  Physical Activity: Inactive (12/28/2019)   Exercise Vital Sign    Days of Exercise per Week: 0 days    Minutes of Exercise per Session: 0 min  Stress: No Stress Concern Present (12/28/2019)   [place]    Feeling of Stress : Not at all  Social Connections:  Moderately Isolated (12/28/2019)   Social Connection and Isolation Panel [NHANES]    Frequency of Communication with Friends and Family: More than three times a week    Frequency of Social Gatherings with Friends and Family: More than three times a week    Attends Religious Services: More than 4 times per year    Active Member of [employer] or Organizations: No    Attends Archivist Meetings: Never    Marital Status: Never married  Intimate Partner Violence: Not At Risk (12/28/2019)   Humiliation, Afraid, Rape, and Kick questionnaire    Fear of Current or Ex-Partner: No    Emotionally Abused: No    Physically Abused: No    Sexually Abused: No    Review of Systems  Constitutional: Negative.   HENT: Negative.    Eyes: Negative.   Respiratory: Negative.    Cardiovascular: Negative.   Gastrointestinal: Negative.   Genitourinary: Negative.   Musculoskeletal: Negative.   Skin: Negative.   Neurological: Negative.   Endo/Heme/Allergies: Negative.   Psychiatric/Behavioral: Negative.    All other systems reviewed and are negative.       Objective    BP (!) 140/80   Pulse 77   Temp (!) 97.1 F (36.2 C) (Oral)   Ht '6\' 3"'$  (1.905 m)   Wt 281 lb (127.5 kg)   SpO2 99%   BMI 35.12 kg/m     09/10/2022    3:14 PM 09/10/2022    2:50 PM 08/06/2022    2:40 PM  Vitals with BMI  Height  '6\' 3"'$    Weight  281 lbs   BMI  89.38   Systolic 101 751   Diastolic 80 92   Pulse  77 83     Physical Exam Vitals and nursing note reviewed.  Constitutional:      Appearance: Normal appearance. He is obese.  HENT:     Head: Normocephalic and atraumatic.     Right Ear: Tympanic membrane, ear canal and external ear normal.     Left Ear: Tympanic membrane, ear canal and external ear normal.     Nose: Nose normal.     Mouth/Throat:     Mouth: Mucous membranes are moist.     Pharynx: Oropharynx is clear.  Eyes:     Extraocular Movements: Extraocular movements intact.     Right eye: Normal  extraocular motion and no nystagmus.     Left eye: Normal extraocular motion and no nystagmus.     Conjunctiva/sclera: Conjunctivae normal.     Pupils: Pupils are equal, round, and reactive to light.  Cardiovascular:     Rate and Rhythm: Normal rate and regular rhythm.     Pulses: Normal pulses.     Heart sounds: Normal heart sounds.  Pulmonary:  Effort: Pulmonary effort is normal.     Breath sounds: Normal breath sounds.  Abdominal:     General: Bowel sounds are normal.     Palpations: Abdomen is soft.  Musculoskeletal:        General: Normal range of motion.     Cervical back: Normal range of motion and neck supple.  Skin:    General: Skin is warm and dry.     Capillary Refill: Capillary refill takes less than 2 seconds.  Neurological:     General: No focal deficit present.     Mental Status: He is alert and oriented to person, place, and time.  Psychiatric:        Mood and Affect: Mood normal.        Speech: Speech normal.        Behavior: Behavior normal.        Thought Content: Thought content normal.        Cognition and Memory: Cognition and memory normal.        Judgment: Judgment normal.        Assessment & Plan:   1. Physical exam, annual Will return for fasting labs. Discussed the importance of eating a heart healthy diet and exercise for maintaining a healthy weight. - CBC with Differential/Platelet; Future - COMPLETE METABOLIC PANEL WITH GFR; Future - Lipid panel; Future  2. Obesity (BMI 35.0-39.9 without comorbidity)   3. Essential hypertension Continue Amlodipine '10mg'$  daily. Check home BP x3 and report via MyChart. Goal BP <130/90, not at goal today.  4. Personal history of Hodgkin lymphoma Followed closely by Oncology, no B symptoms.    Return in about 1 year (around 09/11/2023) for annual physical.   Rubie Maid, FNP

## 2022-09-12 ENCOUNTER — Other Ambulatory Visit: Payer: Self-pay

## 2022-09-12 ENCOUNTER — Other Ambulatory Visit: Payer: BC Managed Care – PPO

## 2022-09-12 DIAGNOSIS — Z0000 Encounter for general adult medical examination without abnormal findings: Secondary | ICD-10-CM

## 2022-09-12 LAB — LIPID PANEL
Cholesterol: 234 mg/dL — ABNORMAL HIGH (ref <200)
HDL: 32 mg/dL — ABNORMAL LOW (ref >=40)
LDL Cholesterol (Calc): 154 mg/dL (calc) — ABNORMAL HIGH
Non-HDL Cholesterol (Calc): 202 mg/dL (calc) — ABNORMAL HIGH (ref <130)
Total CHOL/HDL Ratio: 7.3 (calc) — ABNORMAL HIGH (ref <5.0)
Triglycerides: 292 mg/dL — ABNORMAL HIGH (ref <150)

## 2022-09-12 LAB — CBC WITH DIFFERENTIAL/PLATELET
Absolute Monocytes: 638 cells/uL (ref 200–950)
Basophils Absolute: 53 cells/uL (ref 0–200)
Basophils Relative: 0.7 %
Eosinophils Absolute: 143 cells/uL (ref 15–500)
Eosinophils Relative: 1.9 %
HCT: 42.6 % (ref 38.5–50.0)
Hemoglobin: 14.5 g/dL (ref 13.2–17.1)
Lymphs Abs: 2543 cells/uL (ref 850–3900)
MCH: 29.5 pg (ref 27.0–33.0)
MCHC: 34 g/dL (ref 32.0–36.0)
MCV: 86.6 fL (ref 80.0–100.0)
MPV: 10 fL (ref 7.5–12.5)
Monocytes Relative: 8.5 %
Neutro Abs: 4125 cells/uL (ref 1500–7800)
Neutrophils Relative %: 55 %
Platelets: 321 10*3/uL (ref 140–400)
RBC: 4.92 10*6/uL (ref 4.20–5.80)
RDW: 13 % (ref 11.0–15.0)
Total Lymphocyte: 33.9 %
WBC: 7.5 10*3/uL (ref 3.8–10.8)

## 2022-09-12 LAB — COMPLETE METABOLIC PANEL WITH GFR
AG Ratio: 1.7 (calc) (ref 1.0–2.5)
ALT: 30 U/L (ref 9–46)
AST: 10 U/L (ref 10–40)
Albumin: 4.3 g/dL (ref 3.6–5.1)
Alkaline phosphatase (APISO): 70 U/L (ref 36–130)
BUN: 19 mg/dL (ref 7–25)
CO2: 28 mmol/L (ref 20–32)
Calcium: 9.2 mg/dL (ref 8.6–10.3)
Chloride: 105 mmol/L (ref 98–110)
Creat: 1.08 mg/dL (ref 0.60–1.24)
Globulin: 2.5 g/dL (calc) (ref 1.9–3.7)
Glucose, Bld: 97 mg/dL (ref 65–99)
Potassium: 4.8 mmol/L (ref 3.5–5.3)
Sodium: 140 mmol/L (ref 135–146)
Total Bilirubin: 0.3 mg/dL (ref 0.2–1.2)
Total Protein: 6.8 g/dL (ref 6.1–8.1)
eGFR: 97 mL/min/{1.73_m2} (ref >=60)

## 2022-09-15 ENCOUNTER — Encounter: Payer: Self-pay | Admitting: Family Medicine

## 2022-09-16 ENCOUNTER — Other Ambulatory Visit: Payer: Self-pay | Admitting: Family Medicine

## 2022-09-16 MED ORDER — AMLODIPINE BESYLATE 10 MG PO TABS: 10.0000 mg | ORAL_TABLET | Freq: Every day | ORAL | 3 refills | Status: AC

## 2022-09-23 ENCOUNTER — Telehealth: Payer: Self-pay

## 2022-09-23 ENCOUNTER — Other Ambulatory Visit: Payer: Self-pay | Admitting: Family Medicine

## 2022-09-23 DIAGNOSIS — I10 Essential (primary) hypertension: Secondary | ICD-10-CM

## 2022-09-23 MED ORDER — HYDROCHLOROTHIAZIDE 12.5 MG PO TABS: 12.5000 mg | ORAL_TABLET | Freq: Every day | ORAL | 3 refills | Status: DC

## 2022-09-23 NOTE — Progress Notes (Signed)
Addition of Hydrochlorothiazide 12.'5mg'$  for uncontrolled hypertension, follow-up in 4 weeks.

## 2022-09-23 NOTE — Telephone Encounter (Signed)
Called and LVM for pt to call back, re: Per provider adding a medication for his BP, I also need him to follow-up in office in 4 weeks please.

## 2022-11-07 ENCOUNTER — Ambulatory Visit (INDEPENDENT_AMBULATORY_CARE_PROVIDER_SITE_OTHER): Payer: BLUE CROSS/BLUE SHIELD | Admitting: Family Medicine

## 2022-11-07 ENCOUNTER — Encounter: Payer: Self-pay | Admitting: Family Medicine

## 2022-11-07 VITALS — BP 140/80 | HR 96 | Temp 98.4°F | Ht 75.0 in | Wt 286.0 lb

## 2022-11-07 DIAGNOSIS — J069 Acute upper respiratory infection, unspecified: Secondary | ICD-10-CM | POA: Diagnosis not present

## 2022-11-07 DIAGNOSIS — R0683 Snoring: Secondary | ICD-10-CM | POA: Insufficient documentation

## 2022-11-07 NOTE — Assessment & Plan Note (Signed)
Will refer for sleep studies to evaluate for sleep apnea.

## 2022-11-07 NOTE — Patient Instructions (Addendum)
Your symptoms and exam findings are most consistent with a viral upper respiratory infection. These usually run their course in 5-7 days. Unfortunately, antibiotics don't work against viruses and just increase your risk of other issues such as diarrhea, yeast infections, and resistant infections.  If your symptoms last longer than 10 days and/or you start feeling worse with facial pain, high fever, cough, shortness of breath or start feeling significantly worse, please call us right away to be further evaluated.  Some things that can make you feel better are: - Increased rest - Increasing fluid with water/sugar free electrolytes - Acetaminophen as needed for fever/pain.  - Salt water gargling, chloraseptic spray and throat lozenges - OTC guaifenesin (Mucinex).  - Saline sinus flushes or a neti pot.  - Humidifying the air.  - Delsym for cough - Flonase for nasal congestion

## 2022-11-07 NOTE — Progress Notes (Signed)
Acute Office Visit  Subjective:     Patient ID: Tyler [name], male    DOB: [birth year], 27 y.o.   MRN: [id number]  Chief Complaint  Patient presents with   Acute Visit    Cough and runny nose. Had it for 2 weeks. Mucinex and allergy    HPI Patient is in today for mucopurulent cough, nasal congestion and rhinorrhea, chest congestion since 12/22. His symptoms improved then got worse again. Denies fever, chest pain, shortness, of breath, wheezing, sinus pressure, headache, nausea, vomiting, diarrhea. No known exposures, no home covid test. Has tried mucinex DM, dayquil, nyquil  Other concerns include sleep study for sleep apnea, reports snoring, startling awake, wife reports periods of apnea. Would like to be evaluated for sleep apnea.  Review of Systems  All other systems reviewed and are negative.   Past Medical History:  Diagnosis Date   ADD (attention deficit disorder)    Inattention type   Cyclic vomiting syndrome 2009   GERD (gastroesophageal reflux disease)    Hypertension    Past Surgical History:  Procedure Laterality Date   closed reduction rt. wrist     IR IMAGING GUIDED PORT INSERTION  12/31/2019   IR REMOVAL TUN ACCESS W/ PORT W/O FL MOD SED  08/06/2022   MASS BIOPSY Left 01/03/2020   Procedure: OPEN NECK BIOPSY;  Surgeon: Leta Baptist, MD;  Location: [facility];  Service: ENT;  Laterality: Left;   Current Outpatient Medications on File Prior to Visit  Medication Sig Dispense Refill   amLODipine (NORVASC) 10 MG tablet Take 1 tablet (10 mg total) by mouth daily. 90 tablet 3   hydrochlorothiazide (HYDRODIURIL) 12.5 MG tablet Take 1 tablet (12.5 mg total) by mouth daily. 90 tablet 3   omeprazole (PRILOSEC OTC) 20 MG tablet Take 1 tablet (20 mg total) by mouth daily. (Patient taking differently: Take 20 mg by mouth at bedtime.) 90 tablet 1   sodium chloride (OCEAN) 0.65 % SOLN nasal spray Place 1 spray into both nostrils as needed for congestion.      Current Facility-Administered Medications on File Prior to Visit  Medication Dose Route Frequency Provider Last Rate Last Admin   sodium chloride flush (NS) 0.9 % injection 10 mL  10 mL Intravenous PRN Derek Jack, MD   10 mL at 05/19/20 1033   Allergies  Allergen Reactions   Ace Inhibitors Swelling       Objective:    BP (!) 140/80   Pulse 96   Temp 98.4 F (36.9 C) (Oral)   Ht '6\' 3"'$  (1.905 m)   Wt 286 lb (129.7 kg)   SpO2 98%   BMI 35.75 kg/m    Physical Exam Vitals and nursing note reviewed.  Constitutional:      Appearance: Normal appearance. He is normal weight.  HENT:     Head: Normocephalic and atraumatic.     Right Ear: Tympanic membrane, ear canal and external ear normal.     Left Ear: Tympanic membrane, ear canal and external ear normal.     Nose: Nose normal.     Mouth/Throat:     Mouth: Mucous membranes are moist.     Pharynx: Oropharynx is clear.  Eyes:     Conjunctiva/sclera: Conjunctivae normal.  Cardiovascular:     Rate and Rhythm: Normal rate and regular rhythm.     Pulses: Normal pulses.     Heart sounds: Normal heart sounds.  Pulmonary:     Effort: Pulmonary effort is  normal.     Breath sounds: Normal breath sounds.  Musculoskeletal:     Cervical back: No tenderness.  Lymphadenopathy:     Cervical: No cervical adenopathy.  Skin:    General: Skin is warm and dry.     Capillary Refill: Capillary refill takes less than 2 seconds.  Neurological:     General: No focal deficit present.     Mental Status: He is alert and oriented to person, place, and time. Mental status is at baseline.  Psychiatric:        Mood and Affect: Mood normal.        Behavior: Behavior normal.        Thought Content: Thought content normal.        Judgment: Judgment normal.     No results found for any visits on 11/07/22.      Assessment & Plan:   Problem List Items Addressed This Visit       Respiratory   Viral URI with cough - Primary     Reassured patient that symptoms and exam findings are most consistent with a viral upper respiratory infection and explained lack of efficacy of antibiotics against viruses.  Discussed expected course and features suggestive of secondary bacterial infection.  Continue supportive care. Increase fluid intake with water or electrolyte solution like pedialyte. Encouraged acetaminophen as needed for fever/pain. Encouraged salt water gargling, chloraseptic spray and throat lozenges. Encouraged OTC guaifenesin. Encouraged saline sinus flushes and/or neti with humidified air.          Other   Snoring    Will refer for sleep studies to evaluate for sleep apnea.      Relevant Orders   Ambulatory referral to Sleep Studies    No orders of the defined types were placed in this encounter.   Return if symptoms worsen or fail to improve.  Rubie Maid, FNP

## 2022-11-07 NOTE — Assessment & Plan Note (Signed)

## 2022-12-12 ENCOUNTER — Inpatient Hospital Stay: Payer: BLUE CROSS/BLUE SHIELD

## 2022-12-17 ENCOUNTER — Encounter: Payer: Self-pay | Admitting: Neurology

## 2022-12-17 ENCOUNTER — Ambulatory Visit (INDEPENDENT_AMBULATORY_CARE_PROVIDER_SITE_OTHER): Payer: BLUE CROSS/BLUE SHIELD | Admitting: Neurology

## 2022-12-17 VITALS — BP 142/84 | HR 79 | Ht 74.0 in | Wt 286.5 lb

## 2022-12-17 DIAGNOSIS — Z820 Family history of epilepsy and other diseases of the nervous system: Secondary | ICD-10-CM

## 2022-12-17 DIAGNOSIS — G4719 Other hypersomnia: Secondary | ICD-10-CM

## 2022-12-17 DIAGNOSIS — Z789 Other specified health status: Secondary | ICD-10-CM

## 2022-12-17 DIAGNOSIS — E669 Obesity, unspecified: Secondary | ICD-10-CM

## 2022-12-17 DIAGNOSIS — R030 Elevated blood-pressure reading, without diagnosis of hypertension: Secondary | ICD-10-CM

## 2022-12-17 DIAGNOSIS — R0681 Apnea, not elsewhere classified: Secondary | ICD-10-CM

## 2022-12-17 DIAGNOSIS — R0683 Snoring: Secondary | ICD-10-CM

## 2022-12-17 DIAGNOSIS — G4730 Sleep apnea, unspecified: Secondary | ICD-10-CM

## 2022-12-17 DIAGNOSIS — R635 Abnormal weight gain: Secondary | ICD-10-CM

## 2022-12-17 NOTE — Patient Instructions (Signed)

## 2022-12-17 NOTE — Progress Notes (Addendum)
Subjective:    Patient ID: Tyler [name] is a 27 y.o. male.  HPI    Star Age, MD, PhD [facility] [address], [address]O. [place], [place] [address]  Dear [name],  I saw your patient, Tyler [name], upon your kind request in my sleep clinic today for initial consultation of his sleep disorder, in particular, concern for underlying obstructive sleep apnea.  The patient is unaccompanied today.  As you know, Tyler [name] is a 27 year old male with an underlying medical history of reflux disease, Hodgkin's lymphoma with status post chemotherapy, hypertension, ADD, and obesity, who reports snoring and excessive daytime somnolence, as well as witnessed apneas per wife's report.  His Epworth sleepiness score is 9 out of 24, fatigue severity score is 16 out of 63.  His blood pressure is mildly elevated today.  He reports that he only takes the amlodipine.  He has not actually started taking the hydrochlorothiazide.  He reports that he has had blood pressure issues since he was 18.  He has gained weight in the past 10 years, about 80 pounds altogether, in the past 1 year, in the realm of 10 pounds.  He has a family history of sleep apnea, his father has a CPAP machine, his mom has sleep apnea but does not use a machine.  His paternal uncle also has sleep apnea and patient has used his uncle's AutoPap machine and while he had trouble with the nasal interface as he is a mouth breather, he slept better and wife reported that it seemed to help a lot.  He has gasping sounds and witnessed apneas per wife.  He has woken up with a palpitation at night but no other symptoms.  He denies night to night nocturia or recurrent morning headaches.  He works in [employer].  He goes to bed between 11:30 PM and midnight and rise time is around 8:30 AM.  I reviewed your office note from 11/07/2022. He lives with his wife, his sister, brother-in-law and niece and nephew currently live in the basement as  they are building a home.  Patient has 1 great Dane in the house, sister's family has a smaller dog also.  He drinks quite a bit of caffeine in the form of soda, 1 or 2 bottles per day and 5 to 8 cups of iced tea per day.  He is working on weight loss and started walking this week.  His Past Medical History Is Significant For: Past Medical History:  Diagnosis Date   ADD (attention deficit disorder)    Inattention type   Cyclic vomiting syndrome 2009   GERD (gastroesophageal reflux disease)    Hypertension     His Past Surgical History Is Significant For: Past Surgical History:  Procedure Laterality Date   closed reduction rt. wrist     IR IMAGING GUIDED PORT INSERTION  12/31/2019   IR REMOVAL TUN ACCESS W/ PORT W/O FL MOD SED  08/06/2022   MASS BIOPSY Left 01/03/2020   Procedure: OPEN NECK BIOPSY;  Surgeon: Leta Baptist, MD;  Location: [facility];  Service: ENT;  Laterality: Left;    His Family History Is Significant For: Family History  Problem Relation Age of Onset   Hypertension Mother    Sleep apnea Mother    Hypertension Father    Sleep apnea Father    Cancer Paternal Grandmother     His Social History Is Significant For: Social History   Socioeconomic History  Marital status: Single    Spouse name: Not on file   Number of children: 0   Years of education: Not on file   Highest education level: Not on file  Occupational History   Not on file  Tobacco Use   Smoking status: Never   Smokeless tobacco: Former  Scientific laboratory technician Use: Never used  Substance and Sexual Activity   Alcohol use: Yes    Alcohol/week: 2.0 standard drinks of alcohol    Types: 2 Cans of beer per week   Drug use: Never   Sexual activity: Not Currently  Other Topics Concern   Not on file  Social History Narrative   Not on file   Social Determinants of Health   Financial Resource Strain: Low Risk  (12/28/2019)   Overall Financial Resource Strain (CARDIA)    Difficulty of  Paying Living Expenses: Not hard at all  Food Insecurity: No Food Insecurity (12/28/2019)   Hunger Vital Sign    Worried About Running Out of Food in the Last Year: Never true    Ran Out of Food in the Last Year: Never true  Transportation Needs: No Transportation Needs (12/28/2019)   PRAPARE - Hydrologist (Medical): No    Lack of Transportation (Non-Medical): No  Physical Activity: Inactive (12/28/2019)   Exercise Vital Sign    Days of Exercise per Week: 0 days    Minutes of Exercise per Session: 0 min  Stress: No Stress Concern Present (12/28/2019)   [place]    Feeling of Stress : Not at all  Social Connections: Moderately Isolated (12/28/2019)   Social Connection and Isolation Panel [NHANES]    Frequency of Communication with Friends and Family: More than three times a week    Frequency of Social Gatherings with Friends and Family: More than three times a week    Attends Religious Services: More than 4 times per year    Active Member of [employer] or Organizations: No    Attends Archivist Meetings: Never    Marital Status: Never married    His Allergies Are:  Allergies  Allergen Reactions   Ace Inhibitors Swelling  :   His Current Medications Are:  Outpatient Encounter Medications as of 12/17/2022  Medication Sig   amLODipine (NORVASC) 10 MG tablet Take 1 tablet (10 mg total) by mouth daily.   omeprazole (PRILOSEC OTC) 20 MG tablet Take 1 tablet (20 mg total) by mouth daily. (Patient taking differently: Take 20 mg by mouth at bedtime.)   sodium chloride (OCEAN) 0.65 % SOLN nasal spray Place 1 spray into both nostrils as needed for congestion.   hydrochlorothiazide (HYDRODIURIL) 12.5 MG tablet Take 1 tablet (12.5 mg total) by mouth daily.   Facility-Administered Encounter Medications as of 12/17/2022  Medication   sodium chloride flush (NS) 0.9 % injection 10 mL  :   Review of  Systems:  Out of a complete 14 point review of systems, all are reviewed and negative with the exception of these symptoms as listed below:  Review of Systems  Neurological:        Pt here for sleep consult  Pt snores ,hypertension ,some fatigue Pt denies headaches,sleep study,CPAP machine      ESS:9 FSS:16    Objective:  Neurological Exam  Physical Exam Physical Examination:   Vitals:   12/17/22 0923  BP: (!) 142/84  Pulse: 79    General Examination:  The patient is a very pleasant 27 y.o. male in no acute distress. He appears well-developed and well-nourished and well groomed.   HEENT: Normocephalic, atraumatic, pupils are equal, round and reactive to light, extraocular tracking is good without limitation to gaze excursion or nystagmus noted. Hearing is grossly intact. Face is symmetric with normal facial animation. Speech is clear with no dysarthria noted. There is no hypophonia. There is no lip, neck/head, jaw or voice tremor. Neck is supple with full range of passive and active motion. There are no carotid bruits on auscultation. Oropharynx exam reveals: mild mouth dryness, good dental hygiene and moderate airway crowding, due to tonsillar size of 1-2+ on the left, 1+ on the right, prominent uvula, Mallampati class II, slightly wider tongue.  Tongue protrudes centrally and palate elevates symmetrically, neck circumference 17-7/8 inches.  He has a minimal overbite.  Chest: Clear to auscultation without wheezing, rhonchi or crackles noted.  Heart: S1+S2+0, regular and normal without murmurs, rubs or gallops noted.   Abdomen: Soft, non-tender and non-distended.  Extremities: There is no pitting edema in the distal lower extremities bilaterally.   Skin: Warm and dry without trophic changes noted.   Musculoskeletal: exam reveals no obvious joint deformities.   Neurologically:  Mental status: The patient is awake, alert and oriented in all 4 spheres. His immediate and remote  memory, attention, language skills and fund of knowledge are appropriate. There is no evidence of aphasia, agnosia, apraxia or anomia. Speech is clear with normal prosody and enunciation. Thought process is linear. Mood is normal and affect is normal.  Cranial nerves II - XII are as described above under HEENT exam.  Motor exam: Normal bulk, strength and tone is noted. There is no obvious action or resting tremor.  Fine motor skills and coordination: grossly intact.  Cerebellar testing: No dysmetria or intention tremor. There is no truncal or gait ataxia.  Sensory exam: intact to light touch in the upper and lower extremities.  Gait, station and balance: He stands easily. No veering to one side is noted. No leaning to one side is noted. Posture is age-appropriate and stance is narrow based. Gait shows normal stride length and normal pace. No problems turning are noted.   Assessment and Plan:  In summary, DENIM HOAGE is a very pleasant 27 y.o.-year old male with an underlying medical history of reflux disease, Hodgkin's lymphoma with status post chemotherapy, hypertension, ADD, and obesity, whose history and physical exam are concerning for sleep disordered breathing, supporting a current working diagnosis of unspecified sleep apnea, with the main differential diagnoses of obstructive sleep apnea (OSA) versus upper airway resistance syndrome (UARS) versus central sleep apnea (CSA), or mixed sleep apnea. A laboratory attended sleep study is typically considered "gold standard" for evaluation of sleep disordered breathing.   I had a long chat with the patient about my findings and the diagnosis of sleep apnea, particularly OSA, its prognosis and treatment options. We talked about medical/conservative treatments, surgical interventions and non-pharmacological approaches for symptom control. I explained, in particular, the risks and ramifications of untreated moderate to severe OSA, especially with respect  to developing cardiovascular disease down the road, including congestive heart failure (CHF), difficult to treat hypertension, cardiac arrhythmias (particularly A-fib), neurovascular complications including TIA, stroke and dementia. Even type 2 diabetes has, in part, been linked to untreated OSA. Symptoms of untreated OSA may include (but may not be limited to) daytime sleepiness, nocturia (i.e. frequent nighttime urination), memory problems, mood irritability and suboptimally controlled  or worsening mood disorder such as depression and/or anxiety, lack of energy, lack of motivation, physical discomfort, as well as recurrent headaches, especially morning or nocturnal headaches. We talked about the importance of maintaining a healthy lifestyle and striving for healthy weight. I recommended a sleep study at this time. I outlined the differences between a laboratory attended sleep study which is considered more comprehensive and accurate over the option of a home sleep test (HST); the latter may lead to underestimation of sleep disordered breathing in some instances and does not help with diagnosing upper airway resistance syndrome and is not accurate enough to diagnose primary central sleep apnea typically. I outlined possible surgical and non-surgical treatment options of OSA, including the use of a positive airway pressure (PAP) device (i.e. CPAP, AutoPAP/APAP or BiPAP in certain circumstances), a custom-made dental device (aka oral appliance, which would require a referral to a specialist dentist or orthodontist typically, and is generally speaking not considered for patients with full dentures or edentulous state), upper airway surgical options, such as traditional UPPP (which is not considered a first-line treatment) or the Inspire device (hypoglossal nerve stimulator, which would involve a referral for consultation with an ENT surgeon, after careful selection, following inclusion criteria - also not first-line  treatment). I explained the PAP treatment option to the patient in detail, as this is generally considered first-line treatment.  The patient indicated that he would be willing to try PAP therapy, if the need arises. I explained the importance of being compliant with PAP treatment, not only for insurance purposes but primarily to improve patient's symptoms symptoms, and for the patient's long term health benefit, including to reduce His cardiovascular risks longer-term.   He is encouraged to continue to work on weight loss and work towards caffeine reduction as it may tie-in with his elevated blood pressure values as well.   We will pick up our discussion about the next steps and treatment options after testing.  We will keep him posted as to the test results by phone call and/or MyChart messaging where possible.  We will plan to follow-up in sleep clinic accordingly as well.  I answered all his questions today and the patient was in agreement.   I encouraged him to call with any interim questions, concerns, problems or updates or email [place] through [place].  Generally speaking, sleep test authorizations may take up to 2 weeks, sometimes less, sometimes longer, the patient is encouraged to get in touch with [place] if they do not hear back from the sleep lab staff directly within the next 2 weeks.  Thank you very much for allowing me to participate in the care of this nice patient. If I can be of any further assistance to you please do not hesitate to call me at [phone].  Sincerely,   Star Age, MD, PhD

## 2022-12-19 ENCOUNTER — Ambulatory Visit: Payer: BC Managed Care – PPO | Admitting: Hematology

## 2023-01-06 ENCOUNTER — Inpatient Hospital Stay: Payer: BLUE CROSS/BLUE SHIELD | Attending: Hematology

## 2023-01-06 DIAGNOSIS — Z79899 Other long term (current) drug therapy: Secondary | ICD-10-CM | POA: Insufficient documentation

## 2023-01-06 DIAGNOSIS — I10 Essential (primary) hypertension: Secondary | ICD-10-CM | POA: Insufficient documentation

## 2023-01-06 DIAGNOSIS — C8118 Nodular sclerosis classical Hodgkin lymphoma, lymph nodes of multiple sites: Secondary | ICD-10-CM

## 2023-01-06 DIAGNOSIS — C8179 Other classical Hodgkin lymphoma, extranodal and solid organ sites: Secondary | ICD-10-CM | POA: Insufficient documentation

## 2023-01-06 DIAGNOSIS — G629 Polyneuropathy, unspecified: Secondary | ICD-10-CM | POA: Insufficient documentation

## 2023-01-06 LAB — LACTATE DEHYDROGENASE: LDH: 149 U/L (ref 98–192)

## 2023-01-06 LAB — COMPREHENSIVE METABOLIC PANEL
ALT: 29 U/L (ref 0–44)
AST: 14 U/L — ABNORMAL LOW (ref 15–41)
Albumin: 4.1 g/dL (ref 3.5–5.0)
Alkaline Phosphatase: 80 U/L (ref 38–126)
Anion gap: 8 (ref 5–15)
BUN: 14 mg/dL (ref 6–20)
CO2: 27 mmol/L (ref 22–32)
Calcium: 8.7 mg/dL — ABNORMAL LOW (ref 8.9–10.3)
Chloride: 103 mmol/L (ref 98–111)
Creatinine, Ser: 1 mg/dL (ref 0.61–1.24)
GFR, Estimated: >60 mL/min (ref >60)
Glucose, Bld: 92 mg/dL (ref 70–99)
Potassium: 4.1 mmol/L (ref 3.5–5.1)
Sodium: 138 mmol/L (ref 135–145)
Total Bilirubin: 0.4 mg/dL (ref 0.3–1.2)
Total Protein: 7.3 g/dL (ref 6.5–8.1)

## 2023-01-06 LAB — CBC WITH DIFFERENTIAL/PLATELET
Abs Immature Granulocytes: 0.01 10*3/uL (ref 0.00–0.07)
Basophils Absolute: 0 10*3/uL (ref 0.0–0.1)
Basophils Relative: 1 %
Eosinophils Absolute: 0.1 10*3/uL (ref 0.0–0.5)
Eosinophils Relative: 2 %
HCT: 41.5 % (ref 39.0–52.0)
Hemoglobin: 14 g/dL (ref 13.0–17.0)
Immature Granulocytes: 0 %
Lymphocytes Relative: 35 %
Lymphs Abs: 2 10*3/uL (ref 0.7–4.0)
MCH: 29.5 pg (ref 26.0–34.0)
MCHC: 33.7 g/dL (ref 30.0–36.0)
MCV: 87.4 fL (ref 80.0–100.0)
Monocytes Absolute: 0.6 10*3/uL (ref 0.1–1.0)
Monocytes Relative: 11 %
Neutro Abs: 3 10*3/uL (ref 1.7–7.7)
Neutrophils Relative %: 51 %
Platelets: 286 10*3/uL (ref 150–400)
RBC: 4.75 MIL/uL (ref 4.22–5.81)
RDW: 13 % (ref 11.5–15.5)
WBC: 5.9 10*3/uL (ref 4.0–10.5)
nRBC: 0 % (ref 0.0–0.2)

## 2023-01-06 LAB — SEDIMENTATION RATE: Sed Rate: 9 mm/hr (ref 0–16)

## 2023-01-07 ENCOUNTER — Telehealth: Payer: Self-pay

## 2023-01-07 NOTE — Telephone Encounter (Signed)
LVM for pt to call back to schedule sleep study.  

## 2023-01-07 NOTE — Telephone Encounter (Signed)
Tyler [name] Josem Kaufmann: [id number] (exp. 01/03/23 to 03/03/23)

## 2023-01-13 ENCOUNTER — Inpatient Hospital Stay: Payer: BLUE CROSS/BLUE SHIELD | Admitting: Hematology

## 2023-01-13 ENCOUNTER — Encounter: Payer: Self-pay | Admitting: Hematology

## 2023-01-13 VITALS — BP 156/84 | HR 94 | Temp 97.6°F | Resp 18 | Wt 282.0 lb

## 2023-01-13 DIAGNOSIS — C8118 Nodular sclerosis classical Hodgkin lymphoma, lymph nodes of multiple sites: Secondary | ICD-10-CM | POA: Diagnosis not present

## 2023-01-13 DIAGNOSIS — C8179 Other classical Hodgkin lymphoma, extranodal and solid organ sites: Secondary | ICD-10-CM | POA: Diagnosis not present

## 2023-01-13 DIAGNOSIS — I10 Essential (primary) hypertension: Secondary | ICD-10-CM | POA: Diagnosis not present

## 2023-01-13 DIAGNOSIS — Z79899 Other long term (current) drug therapy: Secondary | ICD-10-CM | POA: Diagnosis not present

## 2023-01-13 DIAGNOSIS — G629 Polyneuropathy, unspecified: Secondary | ICD-10-CM | POA: Diagnosis not present

## 2023-01-13 NOTE — Patient Instructions (Signed)
[facility]  Discharge Instructions  You were seen and examined today by Dr. Katragadda.  Dr. Katragadda discussed your most recent lab work which revealed that everything looks good.  Follow-up as scheduled.    Thank you for choosing [facility] to provide your oncology and hematology care.   To afford each patient quality time with our provider, please arrive at least 15 minutes before your scheduled appointment time. You may need to reschedule your appointment if you arrive late (10 or more minutes). Arriving late affects you and other patients whose appointments are after yours.  Also, if you miss three or more appointments without notifying the office, you may be dismissed from the clinic at the provider's discretion.    Again, thank you for choosing [facility].  Our hope is that these requests will decrease the amount of time that you wait before being seen by our physicians.   If you have a lab appointment with the Cancer Center please come in thru the Main Entrance and check in at the main information desk.           _____________________________________________________________  Should you have questions after your visit to [facility], please contact our office at [phone] and follow the prompts.  Our office hours are 8:00 a.m. to 4:30 p.m. Monday - Thursday and 8:00 a.m. to 2:30 p.m. Friday.  Please note that voicemails left after 4:00 p.m. may not be returned until the following business day.  We are closed weekends and all major holidays.  You do have access to a nurse 24-7, just call the main number to the clinic [phone] and do not press any options, hold on the line and a nurse will answer the phone.    For prescription refill requests, have your pharmacy contact our office and allow 72 hours.    Masks are optional in the cancer centers. If you would like for your care team to wear a  mask while they are taking care of you, please let them know. You may have one support person who is at least 27 years old accompany you for your appointments.  

## 2023-01-13 NOTE — Progress Notes (Signed)
Tyler [name] [address]  [address]    Clinic Day:  01/13/2023  Referring physician: Coral Spikes, DO  Patient Care Team: Rubie Maid, FNP as PCP - General (Family Medicine) Derek Jack, MD as Medical Oncologist (Oncology) Donetta Potts, RN as Oncology Nurse Navigator (Oncology)   ASSESSMENT & PLAN:   Assessment: 1.  Stage IV AE classical Hodgkin's disease: -PET scan on 01/05/2020 shows hypermetabolic left cervical, supraclavicular, bilateral mediastinal adenopathy.  --Hypermetabolic porta hepatic lymph nodes.  --Mildly increased FDG uptake associated with spleen which is above liver activity.  Diffuse increase uptake in the axial and proximal appendicular skeleton. -IPI score of 3. -6 cycles of ABVD from 01/12/2020 through 06/14/2020. -PET2 on 03/06/2010 shows left lower neck lymph node decreased in size and metabolic some.  Measures 2 cm, previously 3.7 cm with SUV 1.9, previously 14.5.  1.6 cm left supraclavicular lymph node with SUV 3.0, previously 2.6 cm with SUV 9.8.  Left prevascular lymph node decreased to 1.9 cm from 5.3 cm.  Right prevascular lymph node decreased to 0.8 cm, previously 2.3 cm.  Borderline prominent 0.9 cm right external iliac lymph node with mild hypermetabolism with maximum SUV 3.5 new. -The right external iliac lymph node is likely reactive.  I have recommended continuing 2 more cycles of ABVD and repeat PET scan after cycle 4. -PET 4 on 04/28/2020 shows previously described right external iliac lymph node measures 0.8 cm, previously 0.9 cm with maximum SUV 1.9, previously 3.5.  Significantly reduced size of the left lower neck and mediastinal lymph nodes, Deauville 3 activity. -PET scan on 07/10/2020 showed a 13 mm short axis left supraclavicular node SUV 1.9.  Previous SUV 2.2.  10 mm short axis left superior mediastinal node SUV 1.9, previously 2.0.  No other adenopathy was seen.  Deauville 3 activity. -XRT was not  offered as he did not have bulky disease (more than 10 cm). -CT CAP on 02/05/2021 showed left supraclavicular and superior mediastinal lymph node slightly diminished in size compared to prior exam.  Supraclavicular lymph node measures 1.3 x 0.8 cm previously 2.2 x 1.3 cm.  No other adenopathy.  Mild splenomegaly, maximum coronal span 13.8 cm.  Plan: 1.  Stage IV classical Hodgkin's disease: - He does not have any B symptoms or infections.  He had Port-A-Cath removed. - Physical exam today does not reveal any palpable adenopathy or splenomegaly. - Labs from 01/06/2023 were reviewed which showed normal LFTs, LDH, ESR and CBC. - He is working on losing weight by exercising and diet changes. - I have recommended follow-up in 6 months with repeat labs and exam.  Will do imaging if clinical condition dictates.   2.  Peripheral neuropathy: - On and off numbness in the left hand third and fourth fingers is stable.  Orders Placed This Encounter  Procedures   CBC with Differential/Platelet    Standing Status:   Future    Standing Expiration Date:   01/13/2024    Order Specific Question:   Release to patient    Answer:   Immediate   Comprehensive metabolic panel    Standing Status:   Future    Standing Expiration Date:   01/13/2024    Order Specific Question:   Release to patient    Answer:   Immediate   Sedimentation rate    Standing Status:   Future    Standing Expiration Date:   01/13/2024   Lactate dehydrogenase  Standing Status:   Future    Standing Expiration Date:   01/13/2024    Order Specific Question:   Release to patient    Answer:   Immediate      I,Alexis Herring,acting as a scribe for Derek Jack, MD.,have documented all relevant documentation on the behalf of Derek Jack, MD,as directed by  Derek Jack, MD while in the presence of Derek Jack, MD.   I, Derek Jack MD, have reviewed the above documentation for accuracy and completeness,  and I agree with the above.   Derek Jack, MD   3/18/20244:57 PM  CHIEF COMPLAINT:   Diagnosis: stage IV AE classical Hodgkin's disease    Cancer Staging  Hodgkin's disease ([place]) Staging form: Hodgkin and Non-Hodgkin Lymphoma, AJCC 8th Edition - Clinical stage from 01/06/2020: Stage IV (Hodgkin lymphoma, A - Asymptomatic) - Unsigned    Prior Therapy: 6 cycles of ABVD from 01/12/2020 through 06/14/2020   Current Therapy:  surveillance    HISTORY OF PRESENT ILLNESS:   Oncology History  Hodgkin's disease ([place])  01/06/2020 Initial Diagnosis   Hodgkin's disease ([place])   01/12/2020 - 06/14/2020 Chemotherapy   Patient is on Treatment Plan : HODGKINS LYMPHOMA ABVD q28d x 2 cycles        INTERVAL HISTORY:   Tyler [name] is a 27 y.o. male presenting to clinic today for follow up of stage IV AE classical Hodgkin's disease. He was last seen by me on 06/13/22.  Today, he states that he is doing well overall. His appetite level is at 100%. His energy level is at 100%. He denies any fevers, night sweats or weight loss.  No infections in the last 6 months.   PAST MEDICAL HISTORY:   Past Medical History: Past Medical History:  Diagnosis Date   ADD (attention deficit disorder)    Inattention type   Cyclic vomiting syndrome 2009   GERD (gastroesophageal reflux disease)    Hypertension     Surgical History: Past Surgical History:  Procedure Laterality Date   closed reduction rt. wrist     IR IMAGING GUIDED PORT INSERTION  12/31/2019   IR REMOVAL TUN ACCESS W/ PORT W/O FL MOD SED  08/06/2022   MASS BIOPSY Left 01/03/2020   Procedure: OPEN NECK BIOPSY;  Surgeon: Leta Baptist, MD;  Location: [facility];  Service: ENT;  Laterality: Left;    Social History: Social History   Socioeconomic History   Marital status: Single    Spouse name: Not on file   Number of children: 0   Years of education: Not on file   Highest education level: Not on file  Occupational History    Not on file  Tobacco Use   Smoking status: Never   Smokeless tobacco: Former  Scientific laboratory technician Use: Never used  Substance and Sexual Activity   Alcohol use: Yes    Alcohol/week: 2.0 standard drinks of alcohol    Types: 2 Cans of beer per week   Drug use: Never   Sexual activity: Not Currently  Other Topics Concern   Not on file  Social History Narrative   Not on file   Social Determinants of Health   Financial Resource Strain: Low Risk  (12/28/2019)   Overall Financial Resource Strain (CARDIA)    Difficulty of Paying Living Expenses: Not hard at all  Food Insecurity: No Food Insecurity (12/28/2019)   Hunger Vital Sign    Worried About Running Out of Food in the Last Year: Never true  Ran Out of Food in the Last Year: Never true  Transportation Needs: No Transportation Needs (12/28/2019)   PRAPARE - Hydrologist (Medical): No    Lack of Transportation (Non-Medical): No  Physical Activity: Inactive (12/28/2019)   Exercise Vital Sign    Days of Exercise per Week: 0 days    Minutes of Exercise per Session: 0 min  Stress: No Stress Concern Present (12/28/2019)   [place]    Feeling of Stress : Not at all  Social Connections: Moderately Isolated (12/28/2019)   Social Connection and Isolation Panel [NHANES]    Frequency of Communication with Friends and Family: More than three times a week    Frequency of Social Gatherings with Friends and Family: More than three times a week    Attends Religious Services: More than 4 times per year    Active Member of [employer] or Organizations: No    Attends Archivist Meetings: Never    Marital Status: Never married  Intimate Partner Violence: Not At Risk (12/28/2019)   Humiliation, Afraid, Rape, and Kick questionnaire    Fear of Current or Ex-Partner: No    Emotionally Abused: No    Physically Abused: No    Sexually Abused: No    Family  History: Family History  Problem Relation Age of Onset   Hypertension Mother    Sleep apnea Mother    Hypertension Father    Sleep apnea Father    Cancer Paternal Grandmother     Current Medications:  Current Outpatient Medications:    amLODipine (NORVASC) 10 MG tablet, Take 1 tablet (10 mg total) by mouth daily., Disp: 90 tablet, Rfl: 3   omeprazole (PRILOSEC OTC) 20 MG tablet, Take 1 tablet (20 mg total) by mouth daily. (Patient taking differently: Take 20 mg by mouth at bedtime.), Disp: 90 tablet, Rfl: 1   sodium chloride (OCEAN) 0.65 % SOLN nasal spray, Place 1 spray into both nostrils as needed for congestion., Disp: , Rfl:  No current facility-administered medications for this visit.  Facility-Administered Medications Ordered in Other Visits:    sodium chloride flush (NS) 0.9 % injection 10 mL, 10 mL, Intravenous, PRN, Derek Jack, MD, 10 mL at 05/19/20 1033   Allergies: Allergies  Allergen Reactions   Ace Inhibitors Swelling    REVIEW OF SYSTEMS:   Review of Systems  Constitutional:  Negative for chills, fatigue and fever.  HENT:   Negative for lump/mass, mouth sores, nosebleeds, sore throat and trouble swallowing.   Eyes:  Negative for eye problems.  Respiratory:  Negative for cough and shortness of breath.   Cardiovascular:  Negative for chest pain, leg swelling and palpitations.  Gastrointestinal:  Negative for abdominal pain, constipation, diarrhea, nausea and vomiting.  Genitourinary:  Negative for bladder incontinence, difficulty urinating, dysuria, frequency, hematuria and nocturia.   Musculoskeletal:  Negative for arthralgias, back pain, flank pain, myalgias and neck pain.  Skin:  Negative for itching and rash.  Neurological:  Positive for numbness. Negative for dizziness and headaches.  Hematological:  Does not bruise/bleed easily.  Psychiatric/Behavioral:  Negative for depression, sleep disturbance and suicidal ideas. The patient is not  nervous/anxious.   All other systems reviewed and are negative.    VITALS:   Blood pressure (!) 156/84, pulse 94, temperature 97.6 F (36.4 C), temperature source Oral, resp. rate 18, weight 282 lb (127.9 kg), SpO2 98 %.  Wt Readings from Last 3 Encounters:  01/13/23 282 lb (127.9 kg)  12/17/22 286 lb 8 oz (130 kg)  11/07/22 286 lb (129.7 kg)    Body mass index is 36.21 kg/m.  Performance status (ECOG): 0 - Asymptomatic  PHYSICAL EXAM:   Physical Exam Vitals and nursing note reviewed. Exam conducted with a chaperone present.  Constitutional:      Appearance: Normal appearance.  Cardiovascular:     Rate and Rhythm: Normal rate and regular rhythm.     Pulses: Normal pulses.     Heart sounds: Normal heart sounds.  Pulmonary:     Effort: Pulmonary effort is normal.     Breath sounds: Normal breath sounds.  Abdominal:     Palpations: Abdomen is soft. There is no hepatomegaly, splenomegaly or mass.     Tenderness: There is no abdominal tenderness.  Musculoskeletal:     Right lower leg: No edema.     Left lower leg: No edema.  Lymphadenopathy:     Cervical: No cervical adenopathy.     Right cervical: No superficial, deep or posterior cervical adenopathy.    Left cervical: No superficial, deep or posterior cervical adenopathy.     Upper Body:     Right upper body: No supraclavicular or axillary adenopathy.     Left upper body: No supraclavicular or axillary adenopathy.  Neurological:     General: No focal deficit present.     Mental Status: He is alert and oriented to person, place, and time.  Psychiatric:        Mood and Affect: Mood normal.        Behavior: Behavior normal.     LABS:      Latest Ref Rng & Units 01/06/2023    3:38 PM 09/12/2022    8:01 AM 06/04/2022    3:52 PM  CBC  WBC 4.0 - 10.5 K/uL 5.9  7.5  8.3   Hemoglobin 13.0 - 17.0 g/dL 14.0  14.5  14.0   Hematocrit 39.0 - 52.0 % 41.5  42.6  41.4   Platelets 150 - 400 K/uL 286  321  298       Latest  Ref Rng & Units 01/06/2023    3:38 PM 09/12/2022    8:01 AM 06/04/2022    3:52 PM  CMP  Glucose 70 - 99 mg/dL 92  97  112   BUN 6 - 20 mg/dL 14  19  14    Creatinine 0.61 - 1.24 mg/dL 1.00  1.08  1.02   Sodium 135 - 145 mmol/L 138  140  138   Potassium 3.5 - 5.1 mmol/L 4.1  4.8  3.6   Chloride 98 - 111 mmol/L 103  105  106   CO2 22 - 32 mmol/L 27  28  24    Calcium 8.9 - 10.3 mg/dL 8.7  9.2  8.8   Total Protein 6.5 - 8.1 g/dL 7.3  6.8  7.3   Total Bilirubin 0.3 - 1.2 mg/dL 0.4  0.3  0.3   Alkaline Phos 38 - 126 U/L 80   76   AST 15 - 41 U/L 14  10  13    ALT 0 - 44 U/L 29  30  29       No results found for: "CEA1", "CEA" / No results found for: "CEA1", "CEA" No results found for: "PSA1" No results found for: "[id number]" No results found for: "CAN125"  No results found for: "TOTALPROTELP", "ALBUMINELP", "A1GS", "A2GS", "BETS", "BETA2SER", "GAMS", "MSPIKE", "SPEI" No results found for: "TIBC", "FERRITIN", "  IRONPCTSAT" Lab Results  Component Value Date   LDH 149 01/06/2023   LDH 148 06/04/2022   LDH 155 01/28/2022     STUDIES:   No results found.

## 2023-01-16 NOTE — Telephone Encounter (Signed)
LVM for pt to call back to schedule sleep study.   We have attempted to call the patient two times to schedule sleep study.  Patient has been unavailable at the phone numbers we have on file and has not returned our calls.  If patient calls back we will schedule them for their sleep study.  

## 2023-03-05 ENCOUNTER — Ambulatory Visit: Payer: BLUE CROSS/BLUE SHIELD | Admitting: Family Medicine

## 2023-03-05 ENCOUNTER — Encounter: Payer: Self-pay | Admitting: Family Medicine

## 2023-03-05 VITALS — BP 140/92 | HR 89 | Temp 98.3°F | Ht 74.0 in | Wt 274.0 lb

## 2023-03-05 DIAGNOSIS — Z79899 Other long term (current) drug therapy: Secondary | ICD-10-CM | POA: Diagnosis not present

## 2023-03-05 DIAGNOSIS — F902 Attention-deficit hyperactivity disorder, combined type: Secondary | ICD-10-CM | POA: Diagnosis not present

## 2023-03-05 DIAGNOSIS — I10 Essential (primary) hypertension: Secondary | ICD-10-CM | POA: Diagnosis not present

## 2023-03-05 MED ORDER — LISDEXAMFETAMINE DIMESYLATE 20 MG PO CAPS: 20.0000 mg | ORAL_CAPSULE | Freq: Every day | ORAL | 0 refills | Status: DC

## 2023-03-05 NOTE — Assessment & Plan Note (Signed)
Chronic. 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 Will start Vyvanse 20mg  daily. EKG NSR, controlled substance agreement signed and urine tox collected. Return to office in 2 weeks for follow-up.

## 2023-03-05 NOTE — Addendum Note (Signed)
Addended by: Park Meo on: 03/05/2023 11:19 AM   Modules accepted: Level of Service

## 2023-03-05 NOTE — Assessment & Plan Note (Signed)
BP 140/92 today in office. He was started on HCTZ several months ago but has not been taking this. Would like to continue with lifestyle modifications. He has decreased his soda intake, still eats "terrible" and drinks sweet tea, he does not have an exercise regimen. Reinforced heart healthy diet options as well as encouraged 150 minutes moderate intensity exercise weekly. EKG NSR. Instructed to seek medical care for chest pain, palpitations, shortness of breath, recurrent headaches, vision changes. Discussed risks of stimulant medications and he would like to proceed and monitor BP at home. Return to office in 2 weeks to recheck BP after starting Vyvanse.

## 2023-03-05 NOTE — Progress Notes (Signed)
Acute Office Visit  Subjective:     Patient ID: Tyler [name], male    DOB: 1996-06-12, 27 y.o.   MRN: [id number]  No chief complaint on file.   HPI Patient is in today for getting better control of his ADHD. 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 He was diagnosed with ADD when he was in 5th grade. He was on Adderall from 5th grade through college. He started on Concerta but tolerated Adderall better due to side effects. He reports his symptoms do interfere with day to day activities such as being distracted while driving and trouble focusing daily.  Of note, his blood pressure does remain elevated today in office. He was prescribed HCTZ several months ago and never started this medication or followed up with me. I did stress importance of blood pressure control and treatment compliance. He denies chest pain, shortness of breath, palpitations, recurrent headaches, or vision changes.  Adult ADHD Self Report Scale (most recent)     Adult ADHD Self-Report Scale (ASRS-v1.1) Symptom Checklist - 03/05/23 0913       Part A   1. How often do you have trouble wrapping up the final details of a project, once the challenging parts have been done? Very Often  2. How often do you have difficulty getting things done in order when you have to do a task that requires organization? Often    3. How often do you have problems remembering appointments or obligations? Very Often  4. When you have a task that requires a lot of thought, how often do you avoid or delay getting started? Sometimes    5. How often do you fidget or squirm with your hands or feet when you have to sit down for a long time? Very Often  6. How often do you feel overly active and compelled to do things, like you were driven by a motor? Often      Part B   7. How often do you make careless mistakes when you have to work on a boring or difficult project? Often  8. How often do you have  difficulty keeping your attention when you are doing boring or repetitive work? Very Often    9. How often do you have difficulty concentrating on what people say to you, even when they are speaking to you directly? Often  10. How often do you misplace or have difficulty finding things at home or at work? Very Often    11. How often are you distracted by activity or noise around you? Very Often  12. How often do you leave your seat in meetings or other situations in which you are expected to remain seated? Sometimes    13. How often do you feel restless or fidgety? Often  14. How often do you have difficulty unwinding and relaxing when you have time to yourself? Sometimes    15. How often do you find yourself talking too much when you are in social situations? Very Often  16. When you are in a conversation, how often do you find yourself finishing the sentences of the people you are talking to, before they can finish them themselves? Very Often    17. How often do you have difficulty waiting your turn in situations when turn taking is required? Often  18. How often do you interrupt others when they are busy? Very Often      Comment  How old were you when these problems first began to occur? 10                Review of Systems  All other systems reviewed and are negative.       Objective:    BP (!) 140/92   Pulse 89   Temp 98.3 F (36.8 C) (Oral)   Ht 6\' 2"  (1.88 m)   Wt 274 lb (124.3 kg)   SpO2 96%   BMI 35.18 kg/m  BP Readings from Last 3 Encounters:  03/05/23 (!) 140/92  01/13/23 (!) 156/84  12/17/22 (!) 142/84      Physical Exam Vitals and nursing note reviewed.  Constitutional:      Appearance: Normal appearance. He is normal weight.  HENT:     Head: Normocephalic and atraumatic.  Cardiovascular:     Rate and Rhythm: Normal rate and regular rhythm.     Pulses: Normal pulses.     Heart sounds: Normal heart sounds.  Pulmonary:     Effort: Pulmonary effort is  normal.     Breath sounds: Normal breath sounds.  Skin:    General: Skin is warm and dry.     Capillary Refill: Capillary refill takes less than 2 seconds.  Neurological:     General: No focal deficit present.     Mental Status: He is alert and oriented to person, place, and time. Mental status is at baseline.  Psychiatric:        Mood and Affect: Mood normal.        Behavior: Behavior normal.        Thought Content: Thought content normal.        Judgment: Judgment normal.     No results found for any visits on 03/05/23.      Assessment & Plan:   Problem List Items Addressed This Visit     Essential hypertension    BP 140/92 today in office. He was started on HCTZ several months ago but has not been taking this. Would like to continue with lifestyle modifications. He has decreased his soda intake, still eats "terrible" and drinks sweet tea, he does not have an exercise regimen. Reinforced heart healthy diet options as well as encouraged 150 minutes moderate intensity exercise weekly. EKG NSR. Instructed to seek medical care for chest pain, palpitations, shortness of breath, recurrent headaches, vision changes. Discussed risks of stimulant medications and he would like to proceed and monitor BP at home. Return to office in 2 weeks to recheck BP after starting Vyvanse.      Attention deficit hyperactivity disorder (ADHD), combined type - Primary    Chronic. 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 Will start Vyvanse 20mg  daily. EKG NSR, controlled substance agreement signed and urine tox collected. Return to office in 2 weeks for follow-up.      Relevant Medications   lisdexamfetamine (VYVANSE) 20 MG capsule   Other Relevant Orders   EKG 12-Lead (Completed)   Other Visit Diagnoses     Controlled substance agreement signed       Relevant Orders   DRUG MONITOR, PANEL 1, W/CONF, URINE       Meds ordered this encounter  Medications    lisdexamfetamine (VYVANSE) 20 MG capsule    Sig: Take 1 capsule (20 mg total) by mouth daily.    Dispense:  90 capsule    Refill:  0    Order Specific Question:   Supervising  Provider    Answer:   Lynnea Ferrier T [[id number]]    Return in about 2 weeks (around 03/19/2023) for BP check.  Park Meo, FNP

## 2023-03-06 ENCOUNTER — Encounter: Payer: Self-pay | Admitting: Family Medicine

## 2023-03-06 LAB — DRUG MONITOR, PANEL 1, W/CONF, URINE
Amphetamines: NEGATIVE ng/mL (ref <500)
Barbiturates: NEGATIVE ng/mL (ref <300)
Benzodiazepines: NEGATIVE ng/mL (ref <100)
Cocaine Metabolite: NEGATIVE ng/mL (ref <150)
Creatinine: 237.1 mg/dL (ref >=20.0)
Marijuana Metabolite: NEGATIVE ng/mL (ref <20)
Methadone Metabolite: NEGATIVE ng/mL (ref <100)
Opiates: NEGATIVE ng/mL (ref <100)
Oxidant: NEGATIVE ug/mL (ref <200)
Oxycodone: NEGATIVE ng/mL (ref <100)
Phencyclidine: NEGATIVE ng/mL (ref <25)
pH: 5.9 (ref 4.5–9.0)

## 2023-03-06 LAB — DM TEMPLATE

## 2023-03-10 ENCOUNTER — Other Ambulatory Visit: Payer: Self-pay | Admitting: Family Medicine

## 2023-03-10 MED ORDER — ATOMOXETINE HCL 40 MG PO CAPS: 40.0000 mg | ORAL_CAPSULE | Freq: Every day | ORAL | 0 refills | Status: DC

## 2023-03-11 ENCOUNTER — Other Ambulatory Visit: Payer: Self-pay | Admitting: Family Medicine

## 2023-03-11 MED ORDER — AMPHETAMINE-DEXTROAMPHET ER 20 MG PO CP24: 20.0000 mg | ORAL_CAPSULE | ORAL | 0 refills | Status: DC

## 2023-03-12 ENCOUNTER — Other Ambulatory Visit: Payer: Self-pay | Admitting: Family Medicine

## 2023-03-12 ENCOUNTER — Other Ambulatory Visit: Payer: Self-pay

## 2023-03-12 MED ORDER — AMPHETAMINE-DEXTROAMPHET ER 20 MG PO CP24: 20.0000 mg | ORAL_CAPSULE | ORAL | 0 refills | Status: DC

## 2023-03-19 ENCOUNTER — Ambulatory Visit: Payer: BLUE CROSS/BLUE SHIELD | Admitting: Family Medicine

## 2023-03-19 ENCOUNTER — Encounter: Payer: Self-pay | Admitting: Family Medicine

## 2023-03-19 VITALS — BP 130/78 | HR 70 | Temp 97.8°F | Ht 74.0 in | Wt 274.0 lb

## 2023-03-19 DIAGNOSIS — F902 Attention-deficit hyperactivity disorder, combined type: Secondary | ICD-10-CM | POA: Diagnosis not present

## 2023-03-19 MED ORDER — AMPHETAMINE-DEXTROAMPHET ER 20 MG PO CP24: 20.0000 mg | ORAL_CAPSULE | ORAL | 0 refills | Status: DC

## 2023-03-19 NOTE — Assessment & Plan Note (Addendum)
Chronic. Continue Adderall 20mg  daily. Continue to monitor blood pressure at home and report to office if he sees a rise in readings >140/90. Report to office side effects of medication or ineffectiveness. Follow up in 3 months, 60 day refill ordered to start 04/11/2023.

## 2023-03-19 NOTE — Progress Notes (Addendum)
Acute Office Visit  Subjective:     Patient ID: Tyler [name], male    DOB: October 07, 1996, 27 y.o.   MRN: [id number]  No chief complaint on file.   HPI Patient is in today for ADHD follow up. After multiple medication changes due to insurance he was able to obtain Adderall and start 20mg  daily last Thursday. He has seen improvement in symptoms, especially noticing better attention and focus while driving. No side effects. His BP today in office is 130/78, this is an improvement for him. He has been able to reduce his caffeine intake quite a bit. He has completed a controlled substance contract, EKG, and UDS.  Review of Systems  Cardiovascular: Negative.   Neurological: Negative.   Psychiatric/Behavioral: Negative.     Past Medical History:  Diagnosis Date   ADD (attention deficit disorder)    Inattention type   Cyclic vomiting syndrome 2009   GERD (gastroesophageal reflux disease)    Hypertension    Past Surgical History:  Procedure Laterality Date   closed reduction rt. wrist     IR IMAGING GUIDED PORT INSERTION  12/31/2019   IR REMOVAL TUN ACCESS W/ PORT W/O FL MOD SED  08/06/2022   MASS BIOPSY Left 01/03/2020   Procedure: OPEN NECK BIOPSY;  Surgeon: Newman Pies, MD;  Location: [facility];  Service: ENT;  Laterality: Left;   Current Outpatient Medications on File Prior to Visit  Medication Sig Dispense Refill   amLODipine (NORVASC) 10 MG tablet Take 1 tablet (10 mg total) by mouth daily. 90 tablet 3   omeprazole (PRILOSEC OTC) 20 MG tablet Take 1 tablet (20 mg total) by mouth daily. (Patient taking differently: Take 20 mg by mouth at bedtime.) 90 tablet 1   No current facility-administered medications on file prior to visit.   Allergies  Allergen Reactions   Ace Inhibitors Swelling        Objective:    BP 130/78   Pulse 70   Temp 97.8 F (36.6 C) (Oral)   Ht 6\' 2"  (1.88 m)   Wt 274 lb (124.3 kg)   SpO2 98%   BMI 35.18 kg/m  BP Readings from Last 3  Encounters:  03/19/23 130/78  03/05/23 (!) 140/92  01/13/23 (!) 156/84      Physical Exam Vitals and nursing note reviewed.  Constitutional:      Appearance: Normal appearance. He is normal weight.  HENT:     Head: Normocephalic and atraumatic.  Cardiovascular:     Rate and Rhythm: Normal rate and regular rhythm.  Skin:    General: Skin is warm and dry.     Capillary Refill: Capillary refill takes less than 2 seconds.  Neurological:     General: No focal deficit present.     Mental Status: He is alert and oriented to person, place, and time. Mental status is at baseline.  Psychiatric:        Mood and Affect: Mood normal.        Behavior: Behavior normal.        Thought Content: Thought content normal.        Judgment: Judgment normal.     No results found for any visits on 03/19/23.      Assessment & Plan:   Problem List Items Addressed This Visit     Attention deficit hyperactivity disorder (ADHD), combined type - Primary    Chronic. Continue Adderall 20mg  daily. Continue to monitor blood pressure at home and report  to office if he sees a rise in readings >140/90. Report to office side effects of medication or ineffectiveness. Follow up in 3 months, 60 day refill ordered to start 04/11/2023.       Meds ordered this encounter  Medications   amphetamine-dextroamphetamine (ADDERALL XR) 20 MG 24 hr capsule    Sig: Take 1 capsule (20 mg total) by mouth every morning.    Dispense:  60 capsule    Refill:  0    Order Specific Question:   Supervising Provider    Answer:   Lynnea Ferrier T [[id number]]    Return in about 3 months (around 06/07/2023) for follow-up ADHD.  Park Meo, FNP

## 2023-04-11 DIAGNOSIS — H43823 Vitreomacular adhesion, bilateral: Secondary | ICD-10-CM | POA: Diagnosis not present

## 2023-04-11 DIAGNOSIS — H59811 Chorioretinal scars after surgery for detachment, right eye: Secondary | ICD-10-CM | POA: Diagnosis not present

## 2023-06-05 ENCOUNTER — Telehealth: Payer: BLUE CROSS/BLUE SHIELD | Admitting: Family Medicine

## 2023-06-10 ENCOUNTER — Telehealth: Payer: BLUE CROSS/BLUE SHIELD | Admitting: Family Medicine

## 2023-06-10 ENCOUNTER — Encounter: Payer: Self-pay | Admitting: Family Medicine

## 2023-06-10 VITALS — Wt 265.0 lb

## 2023-06-10 DIAGNOSIS — F902 Attention-deficit hyperactivity disorder, combined type: Secondary | ICD-10-CM

## 2023-06-10 MED ORDER — AMPHETAMINE-DEXTROAMPHET ER 20 MG PO CP24: 20.0000 mg | ORAL_CAPSULE | ORAL | 0 refills | Status: DC

## 2023-06-10 NOTE — Assessment & Plan Note (Signed)
Chronic well controlled. Continue Adderall XR 20mg  daily. Continue to monitor blood pressure at home and report to office if he sees a rise in readings >140/90. Report to office side effects of medication or ineffectiveness. Follow up in 3 months. PDMP reviewed.

## 2023-06-10 NOTE — Progress Notes (Signed)
Virtual Visit via Video note  I connected with Tyler [name] on 06/10/23 at 0806 by video and verified that I am speaking with the correct person using two identifiers. Tyler [name] is currently located at his car and no one is currently with him during visit. The provider, Park Meo, FNP is located in their office at time of visit.  I discussed the limitations, risks, security and privacy concerns of performing an evaluation and management service by video and the availability of in person appointments. I also discussed with the patient that there may be a patient responsible charge related to this service. The patient expressed understanding and agreed to proceed.  Subjective: PCP: Park Meo, FNP  No chief complaint on file.   HPI  ADHD FOLLOW UP ADHD status: controlled Satisfied with current therapy: yes Medication compliance:  excellent compliance Controlled substance contract: yes Previous psychiatry evaluation: yes Previous medications: yes adderall   Taking meds on weekends/vacations: no Work/school performance:  excellent Difficulty sustaining attention/completing tasks: no Distracted by extraneous stimuli: no Does not listen when spoken to: no  Fidgets with hands or feet: no Unable to stay in seat: no Blurts out/interrupts others: no ADHD Medication Side Effects: no    Decreased appetite: no    Headache: no    Sleeping disturbance pattern: no    Irritability: no    Rebound effects (worse than baseline) off medication: no    Anxiousness: no    Dizziness: no    Tics: no  Home BP readings 130-140/80. Current weight 265lb, he reports 12 pound weight loss and has decreased his soda and bread intake.  ROS: Per HPI  Current Outpatient Medications:    amLODipine (NORVASC) 10 MG tablet, Take 1 tablet (10 mg total) by mouth daily., Disp: 90 tablet, Rfl: 3   amphetamine-dextroamphetamine (ADDERALL XR) 20 MG 24 hr capsule, Take 1 capsule (20 mg total) by  mouth every morning., Disp: 90 capsule, Rfl: 0   omeprazole (PRILOSEC OTC) 20 MG tablet, Take 1 tablet (20 mg total) by mouth daily. (Patient taking differently: Take 20 mg by mouth at bedtime.), Disp: 90 tablet, Rfl: 1  Observations/Objective: Physical Exam Constitutional:      Appearance: Normal appearance.  Pulmonary:     Effort: No respiratory distress.  Neurological:     General: No focal deficit present.     Mental Status: He is alert and oriented to person, place, and time.  Psychiatric:        Mood and Affect: Mood normal.        Behavior: Behavior normal.        Thought Content: Thought content normal.        Judgment: Judgment normal.    Assessment and Plan: Attention deficit hyperactivity disorder (ADHD), combined type Assessment & Plan: Chronic well controlled. Continue Adderall XR 20mg  daily. Continue to monitor blood pressure at home and report to office if he sees a rise in readings >140/90. Report to office side effects of medication or ineffectiveness. Follow up in 3 months. PDMP reviewed.   Other orders -     Amphetamine-Dextroamphet ER; Take 1 capsule (20 mg total) by mouth every morning.  Dispense: 90 capsule; Refill: 0    Follow Up Instructions: Return in about 3 months (around 09/10/2023) for annual physical with labs 1 week prior.   I discussed the assessment and treatment plan with the patient. The patient was provided an opportunity to ask questions and all were answered. The  patient agreed with the plan and demonstrated an understanding of the instructions.   The patient was advised to call back or seek an in-person evaluation if the symptoms worsen or if the condition fails to improve as anticipated.  The above assessment and management plan was discussed with the patient. The patient verbalized understanding of and has agreed to the management plan. Patient is aware to call the clinic if symptoms persist or worsen. Patient is aware when to return to  the clinic for a follow-up visit. Patient educated on when it is appropriate to go to the emergency department.   Time call ended: 0818  I provided 12 minutes of face-to-face time during this encounter.   Kurtis Bushman, MSN, APRN, FNP-C [facility]

## 2023-07-09 ENCOUNTER — Inpatient Hospital Stay: Payer: BLUE CROSS/BLUE SHIELD

## 2023-07-16 ENCOUNTER — Inpatient Hospital Stay: Payer: BLUE CROSS/BLUE SHIELD | Admitting: Hematology

## 2023-07-17 ENCOUNTER — Inpatient Hospital Stay: Payer: BLUE CROSS/BLUE SHIELD | Admitting: Oncology

## 2023-07-25 ENCOUNTER — Inpatient Hospital Stay: Payer: BLUE CROSS/BLUE SHIELD

## 2023-08-01 ENCOUNTER — Inpatient Hospital Stay: Payer: BLUE CROSS/BLUE SHIELD | Admitting: Oncology

## 2023-08-19 ENCOUNTER — Encounter: Payer: Self-pay | Admitting: Family Medicine

## 2023-10-16 DIAGNOSIS — L600 Ingrowing nail: Secondary | ICD-10-CM | POA: Diagnosis not present

## 2023-10-16 DIAGNOSIS — M79675 Pain in left toe(s): Secondary | ICD-10-CM | POA: Diagnosis not present

## 2024-03-08 ENCOUNTER — Encounter: Payer: Self-pay | Admitting: Family Medicine

## 2024-03-08 ENCOUNTER — Ambulatory Visit: Admitting: Family Medicine

## 2024-03-08 VITALS — BP 145/100 | HR 86 | Ht 74.0 in | Wt 242.0 lb

## 2024-03-08 DIAGNOSIS — F902 Attention-deficit hyperactivity disorder, combined type: Secondary | ICD-10-CM

## 2024-03-08 DIAGNOSIS — E782 Mixed hyperlipidemia: Secondary | ICD-10-CM

## 2024-03-08 DIAGNOSIS — Z131 Encounter for screening for diabetes mellitus: Secondary | ICD-10-CM

## 2024-03-08 DIAGNOSIS — Z0000 Encounter for general adult medical examination without abnormal findings: Secondary | ICD-10-CM

## 2024-03-08 DIAGNOSIS — Z0001 Encounter for general adult medical examination with abnormal findings: Secondary | ICD-10-CM

## 2024-03-08 DIAGNOSIS — Z1329 Encounter for screening for other suspected endocrine disorder: Secondary | ICD-10-CM

## 2024-03-08 DIAGNOSIS — C8118 Nodular sclerosis classical Hodgkin lymphoma, lymph nodes of multiple sites: Secondary | ICD-10-CM

## 2024-03-08 DIAGNOSIS — I10 Essential (primary) hypertension: Secondary | ICD-10-CM

## 2024-03-08 MED ORDER — AMPHETAMINE-DEXTROAMPHET ER 20 MG PO CP24: 20.0000 mg | ORAL_CAPSULE | ORAL | 0 refills | Status: AC

## 2024-03-08 NOTE — Assessment & Plan Note (Signed)
 Needs to follow up with oncology. No B symptoms or lymphadenopathy. Labs today.

## 2024-03-08 NOTE — Assessment & Plan Note (Signed)
 BP elevated today, monitor at home and report readings in 1 week. Will restart amlodipine  if home readings elevated. Recommend heart healthy diet such as Mediterranean diet with whole grains, fruits, vegetable, fish, lean meats, nuts, and olive oil. Limit salt. Encouraged moderate walking, 3-5 times/week for 30-50 minutes each session. Aim for at least 150 minutes.week. Goal should be pace of 3 miles/hours, or walking 1.5 miles in 30 minutes. Avoid tobacco products. Avoid excess alcohol. Take medications as prescribed and bring medications and blood pressure log with cuff to each office visit. Seek medical care for chest pain, palpitations, shortness of breath with exertion, dizziness/lightheadedness, vision changes, recurrent headaches, or swelling of extremities.

## 2024-03-08 NOTE — Progress Notes (Addendum)
 Subjective:  HPI: Tyler [name] is a 28 y.o. male presenting on 03/08/2024 for No chief complaint on file.   HPI Patient is in today for medication management and CPE. BP is elevated today. Home readings are <130/80. He is not taking Amlodipine . ADD well controlled on current regimen. 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   ADHD FOLLOW UP ADHD status: controlled Satisfied with current therapy: yes Medication compliance:  excellent compliance Controlled substance contract: yes Previous psychiatry evaluation: yes Previous medications: yes  Adderall  XR 20mg  daily Taking meds on weekends/vacations: no Work/school performance:  good Difficulty sustaining attention/completing tasks: no Distracted by extraneous stimuli: no Does not listen when spoken to: no  Fidgets with hands or feet: no Unable to stay in seat: no Blurts out/interrupts others: no ADHD Medication Side Effects: no    Decreased appetite: no    Headache: no    Sleeping disturbance pattern: no    Irritability: no    Rebound effects (worse than baseline) off medication: no    Anxiousness: no    Dizziness: no    Tics: no   HYPERTENSION without Chronic Kidney Disease Hypertension status: uncontrolled  Satisfied with current treatment? no Duration of hypertension: chronic BP monitoring frequency:  weekly BP range:  BP medication side effects:  no Medication compliance: poor compliance Previous BP meds: amlodipine  Aspirin: no Recurrent headaches: no Visual changes: no Palpitations: no Dyspnea: no Chest pain: no Lower extremity edema: no Dizzy/lightheaded: no   Review of Systems  All other systems reviewed and are negative.   Relevant past medical history reviewed and updated as indicated.   Past Medical History:  Diagnosis Date   ADD (attention deficit disorder)    Inattention type   Cyclic vomiting syndrome 2009   GERD (gastroesophageal reflux disease)     Hypertension      Past Surgical History:  Procedure Laterality Date   closed reduction rt. wrist     IR IMAGING GUIDED PORT INSERTION  12/31/2019   IR REMOVAL TUN ACCESS W/ PORT W/O FL MOD SED  08/06/2022   MASS BIOPSY Left 01/03/2020   Procedure: OPEN NECK BIOPSY;  Surgeon: Reynold Caves, MD;  Location: [facility];  Service: ENT;  Laterality: Left;    Allergies and medications reviewed and updated.   Current Outpatient Medications:    omeprazole  (PRILOSEC  OTC) 20 MG tablet, Take 1 tablet (20 mg total) by mouth daily., Disp: 90 tablet, Rfl: 1   amLODipine  (NORVASC ) 10 MG tablet, Take 1 tablet (10 mg total) by mouth daily. (Patient not taking: Reported on 03/08/2024), Disp: 90 tablet, Rfl: 3   amphetamine -dextroamphetamine (ADDERALL XR) 20 MG 24 hr capsule, Take 1 capsule (20 mg total) by mouth every morning., Disp: 90 capsule, Rfl: 0  Allergies  Allergen Reactions   Ace Inhibitors Swelling    Objective:   BP (!) 145/100   Pulse 86   Ht 6\' 2"  (1.88 m)   Wt 242 lb (109.8 kg)   SpO2 98%   BMI 31.07 kg/m      03/08/2024    4:29 PM 06/10/2023    8:12 AM 03/19/2023    8:15 AM  Vitals with BMI  Height 6\' 2"   6\' 2"   Weight 242 lbs 265 lbs 274 lbs  BMI 31.06  35.16  Systolic 145  130  Diastolic 100  78  Pulse 86  70     Physical Exam Vitals and nursing note  reviewed.  Constitutional:      Appearance: Normal appearance. He is normal weight.  HENT:     Head: Normocephalic and atraumatic.     Right Ear: Tympanic membrane, ear canal and external ear normal.     Left Ear: Tympanic membrane, ear canal and external ear normal.     Nose: Nose normal.     Mouth/Throat:     Mouth: Mucous membranes are moist.     Pharynx: Oropharynx is clear.  Eyes:     Extraocular Movements: Extraocular movements intact.     Conjunctiva/sclera: Conjunctivae normal.     Pupils: Pupils are equal, round, and reactive to light.  Cardiovascular:     Rate and Rhythm: Normal rate and regular  rhythm.     Pulses: Normal pulses.     Heart sounds: Normal heart sounds.  Pulmonary:     Effort: Pulmonary effort is normal.     Breath sounds: Normal breath sounds.  Abdominal:     General: Bowel sounds are normal.     Palpations: Abdomen is soft.  Musculoskeletal:        General: Normal range of motion.  Lymphadenopathy:     Head:     Right side of head: No submental, submandibular, tonsillar or preauricular adenopathy.     Left side of head: No submental, submandibular, tonsillar or preauricular adenopathy.     Cervical: No cervical adenopathy.     Upper Body:     Right upper body: No supraclavicular, axillary or pectoral adenopathy.     Left upper body: No supraclavicular, axillary or pectoral adenopathy.  Skin:    General: Skin is warm and dry.     Capillary Refill: Capillary refill takes less than 2 seconds.  Neurological:     General: No focal deficit present.     Mental Status: He is alert and oriented to person, place, and time. Mental status is at baseline.  Psychiatric:        Mood and Affect: Mood normal.        Behavior: Behavior normal.        Thought Content: Thought content normal.        Judgment: Judgment normal.     Assessment & Plan:  Physical exam, annual Assessment & Plan: Today your medical history was reviewed and routine physical exam with labs was performed. Recommend 150 minutes of moderate intensity exercise weekly and consuming a well-balanced diet. Advised to stop smoking if a smoker, avoid smoking if a non-smoker, limit alcohol consumption to 1 drink per day for women and 2 drinks per day for men, and avoid illicit drug use. Vaccine maintenance discussed. Appropriate health maintenance items reviewed. Return to office in 1 year for annual physical exam.    Essential hypertension Assessment & Plan: BP elevated today, monitor at home and report readings in 1 week. Will restart amlodipine  if home readings elevated. Recommend heart healthy diet  such as Mediterranean diet with whole grains, fruits, vegetable, fish, lean meats, nuts, and olive oil. Limit salt. Encouraged moderate walking, 3-5 times/week for 30-50 minutes each session. Aim for at least 150 minutes.week. Goal should be pace of 3 miles/hours, or walking 1.5 miles in 30 minutes. Avoid tobacco products. Avoid excess alcohol. Take medications as prescribed and bring medications and blood pressure log with cuff to each office visit. Seek medical care for chest pain, palpitations, shortness of breath with exertion, dizziness/lightheadedness, vision changes, recurrent headaches, or swelling of extremities.   Orders: -  CBC with Differential/Platelet -     Comprehensive metabolic panel with GFR -     Lipid panel -     Hemoglobin A1c -     TSH  Mixed hyperlipidemia Assessment & Plan: Labs today. I recommend consuming a heart healthy diet such as Mediterranean diet or DASH diet with whole grains, fruits, vegetable, fish, lean meats, nuts, and olive oil. Limit sweets and processed foods. I also encourage moderate intensity exercise 150 minutes weekly. This is 3-5 times weekly for 30-50 minutes each session. Goal should be pace of 3 miles/hours, or walking 1.5 miles in 30 minutes. The ASCVD Risk score (Arnett DK, et al., 2019) failed to calculate for the following reasons:   The 2019 ASCVD risk score is only valid for ages 86 to 31   Orders: -     CBC with Differential/Platelet -     Comprehensive metabolic panel with GFR -     Lipid panel -     Hemoglobin A1c -     TSH  Attention deficit hyperactivity disorder (ADHD), combined type Assessment & Plan: Chronic well controlled. Continue Adderall XR 20mg  daily. Continue to monitor blood pressure at home and report to office if he sees a rise in readings >140/90. Report to office side effects of medication or ineffectiveness. Follow up in 3 months. PDMP reviewed.   Screening for diabetes mellitus -     Hemoglobin  A1c  Screening for thyroid disorder -     TSH  Nodular sclerosis Hodgkin lymphoma of lymph nodes of multiple regions [facility]) Assessment & Plan: Needs to follow up with oncology. No B symptoms or lymphadenopathy. Labs today.   Other orders -     Amphetamine -Dextroamphet ER; Take 1 capsule (20 mg total) by mouth every morning.  Dispense: 90 capsule; Refill: 0     Follow up plan: Return in about 3 months (around 06/08/2024) for ADHD or sooner if BP elevated at home.  Jenelle Mis, FNP

## 2024-03-08 NOTE — Assessment & Plan Note (Signed)
Chronic well controlled. Continue Adderall XR 20mg  daily. Continue to monitor blood pressure at home and report to office if he sees a rise in readings >140/90. Report to office side effects of medication or ineffectiveness. Follow up in 3 months. PDMP reviewed.

## 2024-03-08 NOTE — Assessment & Plan Note (Signed)
 Labs today. I recommend consuming a heart healthy diet such as Mediterranean diet or DASH diet with whole grains, fruits, vegetable, fish, lean meats, nuts, and olive oil. Limit sweets and processed foods. I also encourage moderate intensity exercise 150 minutes weekly. This is 3-5 times weekly for 30-50 minutes each session. Goal should be pace of 3 miles/hours, or walking 1.5 miles in 30 minutes. The ASCVD Risk score (Arnett DK, et al., 2019) failed to calculate for the following reasons:   The 2019 ASCVD risk score is only valid for ages 83 to 44

## 2024-03-09 ENCOUNTER — Ambulatory Visit: Payer: Self-pay | Admitting: Family Medicine

## 2024-03-09 LAB — CBC WITH DIFFERENTIAL/PLATELET
Absolute Lymphocytes: 3247 {cells}/uL (ref 850–3900)
Absolute Monocytes: 581 {cells}/uL (ref 200–950)
Basophils Absolute: 62 {cells}/uL (ref 0–200)
Basophils Relative: 0.7 %
Eosinophils Absolute: 141 {cells}/uL (ref 15–500)
Eosinophils Relative: 1.6 %
HCT: 44.9 % (ref 38.5–50.0)
Hemoglobin: 14.7 g/dL (ref 13.2–17.1)
MCH: 29.5 pg (ref 27.0–33.0)
MCHC: 32.7 g/dL (ref 32.0–36.0)
MCV: 90 fL (ref 80.0–100.0)
MPV: 10.1 fL (ref 7.5–12.5)
Monocytes Relative: 6.6 %
Neutro Abs: 4770 {cells}/uL (ref 1500–7800)
Neutrophils Relative %: 54.2 %
Platelets: 322 10*3/uL (ref 140–400)
RBC: 4.99 10*6/uL (ref 4.20–5.80)
RDW: 13.2 % (ref 11.0–15.0)
Total Lymphocyte: 36.9 %
WBC: 8.8 10*3/uL (ref 3.8–10.8)

## 2024-03-09 LAB — COMPREHENSIVE METABOLIC PANEL WITH GFR
AG Ratio: 2 (calc) (ref 1.0–2.5)
ALT: 26 U/L (ref 9–46)
AST: 9 U/L — ABNORMAL LOW (ref 10–40)
Albumin: 4.7 g/dL (ref 3.6–5.1)
Alkaline phosphatase (APISO): 62 U/L (ref 36–130)
BUN: 16 mg/dL (ref 7–25)
CO2: 26 mmol/L (ref 20–32)
Calcium: 9.5 mg/dL (ref 8.6–10.3)
Chloride: 103 mmol/L (ref 98–110)
Creat: 1.01 mg/dL (ref 0.60–1.24)
Globulin: 2.4 g/dL (ref 1.9–3.7)
Glucose, Bld: 97 mg/dL (ref 65–99)
Potassium: 4.8 mmol/L (ref 3.5–5.3)
Sodium: 142 mmol/L (ref 135–146)
Total Bilirubin: 0.3 mg/dL (ref 0.2–1.2)
Total Protein: 7.1 g/dL (ref 6.1–8.1)
eGFR: 105 mL/min/{1.73_m2} (ref >=60)

## 2024-03-09 LAB — HEMOGLOBIN A1C
Hgb A1c MFr Bld: 5.8 % — ABNORMAL HIGH (ref <5.7)
Mean Plasma Glucose: 120 mg/dL
eAG (mmol/L): 6.6 mmol/L

## 2024-03-09 LAB — TSH: TSH: 2.76 m[IU]/L (ref 0.40–4.50)

## 2024-03-09 LAB — LIPID PANEL
Cholesterol: 230 mg/dL — ABNORMAL HIGH (ref <200)
HDL: 33 mg/dL — ABNORMAL LOW (ref >=40)
LDL Cholesterol (Calc): 148 mg/dL — ABNORMAL HIGH
Non-HDL Cholesterol (Calc): 197 mg/dL — ABNORMAL HIGH (ref <130)
Total CHOL/HDL Ratio: 7 (calc) — ABNORMAL HIGH (ref <5.0)
Triglycerides: 321 mg/dL — ABNORMAL HIGH (ref <150)

## 2024-03-15 DIAGNOSIS — Z0000 Encounter for general adult medical examination without abnormal findings: Secondary | ICD-10-CM | POA: Insufficient documentation

## 2024-03-15 NOTE — Assessment & Plan Note (Signed)
Today your medical history was reviewed and routine physical exam with labs was performed. Recommend 150 minutes of moderate intensity exercise weekly and consuming a well-balanced diet. Advised to stop smoking if a smoker, avoid smoking if a non-smoker, limit alcohol consumption to 1 drink per day for women and 2 drinks per day for men, and avoid illicit drug use. Vaccine maintenance discussed. Appropriate health maintenance items reviewed. Return to office in 1 year for annual physical exam.

## 2024-06-08 ENCOUNTER — Encounter: Payer: Self-pay | Admitting: Family Medicine

## 2024-06-08 ENCOUNTER — Ambulatory Visit: Admitting: Family Medicine

## 2024-06-08 VITALS — BP 124/78 | HR 81 | Temp 97.7°F | Ht 74.0 in | Wt 278.4 lb

## 2024-06-08 DIAGNOSIS — F902 Attention-deficit hyperactivity disorder, combined type: Secondary | ICD-10-CM | POA: Diagnosis not present

## 2024-06-08 NOTE — Progress Notes (Signed)
 Subjective:  HPI: Tyler [name] is a 28 y.o. male presenting on 06/08/2024 for Medical Management of Chronic Issues (3 mo f/u )   HPI Patient is in today for follow up for ADHD. Has stopped taking his Adderall XR due to no longer needing. 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 Symptoms well controlled outside of needing help focusing for that. Denies inattention, difficulty concentrating, restlessness, difficulty completing tasks. Tyler [name] is expecting a newborn in October.   Review of Systems  All other systems reviewed and are negative.   Relevant past medical history reviewed and updated as indicated.   Past Medical History:  Diagnosis Date   ADD (attention deficit disorder)    Inattention type   Cyclic vomiting syndrome 2009   GERD (gastroesophageal reflux disease)    Hypertension      Past Surgical History:  Procedure Laterality Date   closed reduction rt. wrist     IR IMAGING GUIDED PORT INSERTION  12/31/2019   IR REMOVAL TUN ACCESS W/ PORT W/O FL MOD SED  08/06/2022   MASS BIOPSY Left 01/03/2020   Procedure: OPEN NECK BIOPSY;  Surgeon: Karis Clunes, MD;  Location: [facility];  Service: ENT;  Laterality: Left;    Allergies and medications reviewed and updated.   Current Outpatient Medications:    amphetamine -dextroamphetamine (ADDERALL XR) 20 MG 24 hr capsule, Take 1 capsule (20 mg total) by mouth every morning. (Patient taking differently: Take 20 mg by mouth continuous as needed.), Disp: 90 capsule, Rfl: 0   omeprazole  (PRILOSEC  OTC) 20 MG tablet, Take 1 tablet (20 mg total) by mouth daily., Disp: 90 tablet, Rfl: 1   amLODipine  (NORVASC ) 10 MG tablet, Take 1 tablet (10 mg total) by mouth daily. (Patient not taking: Reported on 06/08/2024), Disp: 90 tablet, Rfl: 3  Allergies  Allergen Reactions   Ace Inhibitors Swelling    Objective:   BP 124/78   Pulse 81   Temp 97.7 F (36.5 C)   Ht 6' 2 (1.88 m)   Wt 278 lb 6.4 oz (126.3 kg)   SpO2 97%   BMI 35.74  kg/m      06/08/2024    3:49 PM 03/08/2024    4:29 PM 06/10/2023    8:12 AM  Vitals with BMI  Height 6' 2 6' 2   Weight 278 lbs 6 oz 242 lbs 265 lbs  BMI 35.73 31.06   Systolic 124 145   Diastolic 78 100   Pulse 81 86      Physical Exam Vitals and nursing note reviewed.  Constitutional:      Appearance: Normal appearance. He is normal weight.  HENT:     Head: Normocephalic and atraumatic.  Skin:    General: Skin is warm and dry.     Capillary Refill: Capillary refill takes less than 2 seconds.  Neurological:     General: No focal deficit present.     Mental Status: He is alert and oriented to person, place, and time. Mental status is at baseline.  Psychiatric:        Mood and Affect: Mood normal.        Behavior: Behavior normal.        Thought Content: Thought content normal.        Judgment: Judgment normal.     Assessment & Plan:  Attention deficit hyperactivity disorder (ADHD), combined type Assessment & Plan: Chronic well controlled without medication. Follow up PRN      Follow up plan:  Return in about 9 months (around 03/08/2025) for annual physical with labs 1 week prior.  Tyler GORMAN Barrio, FNP

## 2024-06-08 NOTE — Assessment & Plan Note (Signed)
 Chronic well controlled without medication. Follow up PRN

## 2025-03-15 ENCOUNTER — Encounter: Admitting: Family Medicine
